# Patient Record
Sex: Female | Born: 1945
Health system: Southern US, Community
[De-identification: ages and names within clinical notes are randomized; demographics above are authoritative.]

## PROBLEM LIST (undated history)

## (undated) DIAGNOSIS — I2699 Other pulmonary embolism without acute cor pulmonale: Secondary | ICD-10-CM

## (undated) DIAGNOSIS — M1990 Unspecified osteoarthritis, unspecified site: Secondary | ICD-10-CM

## (undated) DIAGNOSIS — R05 Cough: Secondary | ICD-10-CM

## (undated) DIAGNOSIS — E538 Deficiency of other specified B group vitamins: Secondary | ICD-10-CM

## (undated) DIAGNOSIS — R053 Chronic cough: Secondary | ICD-10-CM

## (undated) DIAGNOSIS — I10 Essential (primary) hypertension: Secondary | ICD-10-CM

## (undated) DIAGNOSIS — R590 Localized enlarged lymph nodes: Secondary | ICD-10-CM

## (undated) DIAGNOSIS — I82409 Acute embolism and thrombosis of unspecified deep veins of unspecified lower extremity: Secondary | ICD-10-CM

## (undated) DIAGNOSIS — E785 Hyperlipidemia, unspecified: Secondary | ICD-10-CM

## (undated) DIAGNOSIS — K219 Gastro-esophageal reflux disease without esophagitis: Secondary | ICD-10-CM

## (undated) DIAGNOSIS — E119 Type 2 diabetes mellitus without complications: Secondary | ICD-10-CM

## (undated) DIAGNOSIS — F32A Depression, unspecified: Secondary | ICD-10-CM

## (undated) DIAGNOSIS — J189 Pneumonia, unspecified organism: Secondary | ICD-10-CM

## (undated) DIAGNOSIS — J45909 Unspecified asthma, uncomplicated: Secondary | ICD-10-CM

## (undated) DIAGNOSIS — F419 Anxiety disorder, unspecified: Secondary | ICD-10-CM

## (undated) HISTORY — DX: Hyperlipidemia, unspecified: E78.5

## (undated) HISTORY — DX: Chronic cough: R05.3

## (undated) HISTORY — PX: TONSILLECTOMY: SUR1361

## (undated) HISTORY — DX: Localized enlarged lymph nodes: R59.0

## (undated) HISTORY — DX: Unspecified asthma, uncomplicated: J45.909

## (undated) HISTORY — DX: Cough: R05

## (undated) HISTORY — PX: NASAL SINUS SURGERY: SHX719

## (undated) HISTORY — PX: KNEE ARTHROSCOPY: SUR90

## (undated) HISTORY — PX: CATARACT EXTRACTION: SUR2

## (undated) HISTORY — DX: Other pulmonary embolism without acute cor pulmonale: I26.99

## (undated) HISTORY — DX: Deficiency of other specified B group vitamins: E53.8

## (undated) HISTORY — DX: Pneumonia, unspecified organism: J18.9

## (undated) HISTORY — PX: TUBAL LIGATION: SHX77

---

## 1985-01-04 HISTORY — PX: TONSILLECTOMY: SUR1361

## 1993-01-04 HISTORY — PX: OTHER SURGICAL HISTORY: SHX169

## 1997-08-21 ENCOUNTER — Ambulatory Visit (HOSPITAL_COMMUNITY): Admission: RE | Admit: 1997-08-21 | Discharge: 1997-08-21 | Payer: Self-pay | Admitting: Orthopedic Surgery

## 1998-06-18 ENCOUNTER — Other Ambulatory Visit: Admission: RE | Admit: 1998-06-18 | Discharge: 1998-06-18 | Payer: Self-pay | Admitting: Family Medicine

## 1999-08-26 ENCOUNTER — Other Ambulatory Visit: Admission: RE | Admit: 1999-08-26 | Discharge: 1999-08-26 | Payer: Self-pay | Admitting: Family Medicine

## 2000-08-26 ENCOUNTER — Other Ambulatory Visit: Admission: RE | Admit: 2000-08-26 | Discharge: 2000-08-26 | Payer: Self-pay | Admitting: Family Medicine

## 2001-09-01 ENCOUNTER — Other Ambulatory Visit: Admission: RE | Admit: 2001-09-01 | Discharge: 2001-09-01 | Payer: Self-pay | Admitting: Family Medicine

## 2002-09-04 ENCOUNTER — Other Ambulatory Visit: Admission: RE | Admit: 2002-09-04 | Discharge: 2002-09-04 | Payer: Self-pay | Admitting: Family Medicine

## 2003-09-05 ENCOUNTER — Other Ambulatory Visit: Admission: RE | Admit: 2003-09-05 | Discharge: 2003-09-05 | Payer: Self-pay | Admitting: Family Medicine

## 2004-09-09 ENCOUNTER — Encounter: Admission: RE | Admit: 2004-09-09 | Discharge: 2004-09-09 | Payer: Self-pay | Admitting: Internal Medicine

## 2004-09-14 ENCOUNTER — Ambulatory Visit: Payer: Self-pay | Admitting: Family Medicine

## 2004-09-14 ENCOUNTER — Other Ambulatory Visit: Admission: RE | Admit: 2004-09-14 | Discharge: 2004-09-14 | Payer: Self-pay | Admitting: Family Medicine

## 2004-09-30 ENCOUNTER — Ambulatory Visit: Payer: Self-pay | Admitting: Family Medicine

## 2008-01-05 HISTORY — PX: OTHER SURGICAL HISTORY: SHX169

## 2008-08-15 ENCOUNTER — Encounter: Admission: RE | Admit: 2008-08-15 | Discharge: 2008-08-15 | Payer: Self-pay | Admitting: Orthopedic Surgery

## 2008-09-25 ENCOUNTER — Ambulatory Visit: Payer: Self-pay

## 2008-09-25 ENCOUNTER — Encounter: Payer: Self-pay | Admitting: Cardiovascular Disease

## 2008-11-27 ENCOUNTER — Inpatient Hospital Stay (HOSPITAL_COMMUNITY): Admission: RE | Admit: 2008-11-27 | Discharge: 2008-12-01 | Payer: Self-pay | Admitting: Orthopedic Surgery

## 2010-02-24 ENCOUNTER — Encounter: Payer: Self-pay | Admitting: Internal Medicine

## 2010-03-02 ENCOUNTER — Institutional Professional Consult (permissible substitution) (INDEPENDENT_AMBULATORY_CARE_PROVIDER_SITE_OTHER): Payer: Medicare Other | Admitting: Internal Medicine

## 2010-03-02 ENCOUNTER — Encounter: Payer: Self-pay | Admitting: Internal Medicine

## 2010-03-02 DIAGNOSIS — E785 Hyperlipidemia, unspecified: Secondary | ICD-10-CM | POA: Insufficient documentation

## 2010-03-02 DIAGNOSIS — R053 Chronic cough: Secondary | ICD-10-CM | POA: Insufficient documentation

## 2010-03-02 DIAGNOSIS — R599 Enlarged lymph nodes, unspecified: Secondary | ICD-10-CM | POA: Insufficient documentation

## 2010-03-02 DIAGNOSIS — R05 Cough: Secondary | ICD-10-CM | POA: Insufficient documentation

## 2010-03-02 DIAGNOSIS — J45909 Unspecified asthma, uncomplicated: Secondary | ICD-10-CM | POA: Insufficient documentation

## 2010-03-02 DIAGNOSIS — J189 Pneumonia, unspecified organism: Secondary | ICD-10-CM | POA: Insufficient documentation

## 2010-03-02 DIAGNOSIS — I2699 Other pulmonary embolism without acute cor pulmonale: Secondary | ICD-10-CM | POA: Insufficient documentation

## 2010-03-03 ENCOUNTER — Encounter: Payer: Self-pay | Admitting: Internal Medicine

## 2010-03-05 ENCOUNTER — Institutional Professional Consult (permissible substitution): Payer: Self-pay | Admitting: Critical Care Medicine

## 2010-03-12 NOTE — Assessment & Plan Note (Signed)
Summary: Pulmonary/ new pt eval for cough/ hilar adenopathy    Visit Type:  Initial Consult Copy to:  Kathryn Sofia, PA Primary Provider/Referring Provider:  [name], PA  CC:  Cough and Abnormal CT Chest.  History of Present Illness: 56 yowf never smoker with year round nose draining and cough x decades on zyrtec daily as maintenance with previous allergy eval inconclusive by Dr Lucie Leather and rx with inhalers seem to help coughing spells but no daily use.    March 02, 2010  1st pulmonary office eval cc acute worse x 3 weeks with bad cough assoc with nasal congestion,  productive green mucus with pain on both flanks,  low grade,  weak couldn't sit up, no appetite rx as outpt  abx x 2  and maybe prednisone shot  >  still coughing on day of ov but no fever or green mucus and overall much better.    Presently   denies any significant sore throat, dysphagia, itching, sneezing,    fever, chills, sweats, unintended wt loss, pleuritic or exertional cp, hempoptysis, change in activity tolerance  orthopnea pnd or leg swelling Pt also denies any obvious fluctuation in symptoms with weather or environmental change or other alleviating or aggravating factors.       Current Medications (verified): 1)  Losartan Potassium 50 Mg Tabs (Losartan Potassium) .Marland Kitchen.. 1 Once Daily 2)  Furosemide 20 Mg Tabs (Furosemide) .Marland Kitchen.. 1 Once Daily 3)  Citalopram Hydrobromide 40 Mg Tabs (Citalopram Hydrobromide) .Marland Kitchen.. 1 Once Daily 4)  Trilipix 135 Mg Cpdr (Choline Fenofibrate) .Marland Kitchen.. 1 Once Daily 5)  Vitamin D (Ergocalciferol) 50000 Unit Caps (Ergocalciferol) .Marland Kitchen.. 1 Every Week 6)  Ranitidine Hcl 300 Mg Caps (Ranitidine Hcl) .Marland Kitchen.. 1 At Bedtime 7)  Hydromet 5-1.5 Mg/20ml Syrp (Hydrocodone-Homatropine) .Marland Kitchen.. 1 Tsp Four Times A Day As Needed 8)  Proventil Hfa 108 (90 Base) Mcg/act Aers (Albuterol Sulfate) .... 2 Puffs Every 4-6 Hrs As Needed 9)  Cefdinir 300 Mg Caps (Cefdinir) .Marland Kitchen.. 1 Every 12 Hrs X 10 Days Per Dr Sed[place] Muta 10)  Fish Oil  1000 Mg Caps (Omega-3 Fatty Acids) .Marland Kitchen.. 1 Two Times A Day 11)  Aspirin 81 Mg Tbec (Aspirin) .Marland Kitchen.. 1 Once Daily 12)  Zyrtec Allergy 10 Mg Tabs (Cetirizine Hcl) .Marland Kitchen.. 1 Once Daily As Needed  Allergies (verified): 1)  Prednisone  Past History:  Past Medical History: HYPERLIPIDEMIA (ICD-272.4) ASTHMA (ICD-493.90) Hx of PNEUMONIA (ICD-486) Hx of PULMONARY EMBOLISM (ICD-415.19) Hilar adenopathy by CT 02/24/2010       Past Surgical History: Left shoulder surgery 1995 Bilateral arthroscopic knee surgeries Left knee replacement 2010 Sinus surgery age 86 Tubal ligation age 32 Tonsillectomy age 64  Family History: Colon CA-Cousin Breast CA- Cousin RA- Mother  Social History: Married Children Never smoker No ETOH Retired from Facilities manager  Review of Systems       The patient complains of shortness of breath with activity, productive cough, acid heartburn, nasal congestion/difficulty breathing through nose, sneezing, and change in color of mucus.  The patient denies shortness of breath at rest, non-productive cough, coughing up blood, chest pain, irregular heartbeats, indigestion, loss of appetite, weight change, abdominal pain, difficulty swallowing, sore throat, tooth/dental problems, headaches, itching, ear ache, anxiety, depression, hand/feet swelling, joint stiffness or pain, rash, and fever.    Vital Signs:  Patient profile:   65 year old female Height:      66 inches Weight:      230.50 pounds BMI:     37.34 O2 Sat:  95 % on Room air Temp:     97.8 degrees F oral Pulse rate:   77 / minute BP sitting:   136 / 74  (left arm) Cuff size:   large  Vitals Entered By: [name] (March 02, 2010 8:48 AM)  O2 Flow:  Room air  Physical Exam  Additional Exam:  obese amb wf nad wt 230 March 02, 2010  HEENT: nl dentition and orophanx.Mod turbinate edema, nonspecific.  Nl external ear canals without cough reflex NECK :  without JVD/Nodes/TM/ nl carotid  upstrokes bilaterally LUNGS: no acc muscle use, clear to A and P bilaterally without cough on insp or exp maneuvers CV:  RRR  no s3 or murmur or increase in P2, no edema  ABD:  soft and nontender with nl excursion in the supine position. No bruits or organomegaly, bowel sounds nl MS:  warm without deformities, calf tenderness, cyanosis or clubbing SKIN: warm and dry without lesions   NEURO:  alert, approp, no deficits     Impression & Recommendations:  Problem # 1:  COUGH (ICD-786.2)  The most common causes of chronic cough in immunocompetent adults include: upper airway cough syndrome (UACS), previously referred to as postnasal drip syndrome,  caused by variety of rhinosinus conditions; (2) asthma; (3) GERD; (4) chronic bronchitis from cigarette smoking or other inhaled environmental irritants; (5) nonasthmatic eosinophilic bronchitis; and (6) bronchiectasis. These conditions, singly or in combination, have accounted for up to 94% of the causes of chronic cough in prospective studies.    Classic Upper airway cough syndrome, so named because it's frequently impossible to sort out how much is  CR/sinusitis with freq throat clearing (which can be related to primary GERD)   vs  causing  secondary extra esophageal GERD from wide swings in gastric pressure that occur with throat clearing, promoting self use of mint and menthol lozenges that reduce the lower esophageal sphincter tone and exacerbate the problem further These are the same pts who not infrequently have failed to tolerate ace inhibitors,  dry powder inhalers or biphosphonates or report having reflux symptoms that don't respond to standard doses of PPI   See instructions for specific recommendations.  start with rx of reflux then add sinus ct if not better as next step.  Orders: New Patient Level V (81191)  Problem # 2:  ADENOPATHY LYMPH GLAND/TRACHEOBR (ICD-785.6) Most likely reactive but definitely needs f/u - the biggest node is only  2.3 cm and assuming her symptoms resolve i would not pursue a tissue dx unless there is convincing evidence of "macroscopic dz" that can be discerned by plain cxr.  otherwise we end up pursuing what most of the time is either  reactive adenopathy or sometimes sarcoid for which treatment is not indicated.   If there is a malignancy or lymphoproliferative process she'll either have persistent  symptoms or a cxr that shows convincing abnormalities which would justify an invasive procedure.   Explained all this to pt in detail:  treat the symptoms first,  f/u conservatively for now     Medications Added to Medication List This Visit: 1)  Losartan Potassium 50 Mg Tabs (Losartan potassium) .Marland Kitchen.. 1 once daily 2)  Furosemide 20 Mg Tabs (Furosemide) .Marland Kitchen.. 1 once daily 3)  Citalopram Hydrobromide 40 Mg Tabs (Citalopram hydrobromide) .Marland Kitchen.. 1 once daily 4)  Trilipix 135 Mg Cpdr (Choline fenofibrate) .Marland Kitchen.. 1 once daily 5)  Vitamin D (ergocalciferol) 50000 Unit Caps (Ergocalciferol) .Marland Kitchen.. 1 every week 6)  Ranitidine Hcl 300  Mg Caps (Ranitidine hcl) .Marland Kitchen.. 1 at bedtime 7)  Hydromet 5-1.5 Mg/55ml Syrp (Hydrocodone-homatropine) .Marland Kitchen.. 1 tsp four times a day as needed 8)  Cefdinir 300 Mg Caps (Cefdinir) .Marland Kitchen.. 1 every 12 hrs x 10 days per dr cox 9)  Fish Oil 1000 Mg Caps (Omega-3 fatty acids) .Marland Kitchen.. 1 two times a day 10)  Aspirin 81 Mg Tbec (Aspirin) .Marland Kitchen.. 1 once daily 11)  Zyrtec Allergy 10 Mg Tabs (Cetirizine hcl) .Marland Kitchen.. 1 once daily as needed 12)  Proventil Hfa 108 (90 Base) Mcg/act Aers (Albuterol sulfate) .... 2 puffs every 4-6 hrs as needed 13)  Protonix 40 Mg Tbec (Pantoprazole sodium) .... By mouth daily. take one half hour before eating. 14)  Tramadol Hcl 50 Mg Tabs (Tramadol hcl) .... One to two by mouth every 4-6 hours as needed cough or chest pain  Patient Instructions: 1)  Start protonix 40 mg Take  one 30-60 min before first meal of the day (prilosec otc 20 x 2)  2)  Continue ranitidine 150 mg one at bedtime 3)   Take mucinex 2 every 12 hours and add tramadol 50 mg up to every 4 hours to suppress the urge to cough. Swallowing water or using ice chips/non mint and menthol containing candies (such as lifesavers or sugarless jolly ranchers) are also effective.  4)  GERD (REFLUX)  is a common cause of respiratory symptoms. It commonly presents without heartburn and can be treated with medication, but also with lifestyle changes including avoidance of late meals, excessive alcohol, smoking cessation, and avoid fatty foods, chocolate, peppermint, colas, red wine, and acidic juices such as orange juice. NO MINT OR MENTHOL PRODUCTS SO NO COUGH DROPS  5)  USE SUGARLESS CANDY INSTEAD (jolley ranchers)  6)  NO OIL BASED VITAMINS (NO FISH OIL).  7)  Please schedule a follow-up appointment in 2 weeks, sooner if needed with cxr on return and if at all possible bring your old cxr 8)  If not better [phone] ask [facility] for CT Sinus limited on day of visit Prescriptions: PROTONIX 40 MG  TBEC (PANTOPRAZOLE SODIUM) By mouth daily. Take one half hour before eating.  #34 x 2   Entered and Authorized by:   Nyoka Cowden MD   Signed by:   Nyoka Cowden MD on 03/02/2010   Method used:   Electronically to        [facility].* (retail)       [address]       [place], [place]  [address]       Ph: [phone] or [phone]       Fax: [phone]   RxID:   [id number] TRAMADOL HCL 50 MG  TABS (TRAMADOL HCL) One to two by mouth every 4-6 hours as needed cough or chest pain  #40 x 0   Entered and Authorized by:   Nyoka Cowden MD   Signed by:   Nyoka Cowden MD on 03/02/2010   Method used:   Electronically to        [facility] (retail)       [address]       [place], [place]  [address]       Ph: [phone] or [phone]       Fax: [phone]   RxID:   [id number]

## 2010-03-16 ENCOUNTER — Other Ambulatory Visit: Payer: Self-pay | Admitting: Internal Medicine

## 2010-03-16 ENCOUNTER — Telehealth: Payer: Self-pay | Admitting: Internal Medicine

## 2010-03-16 DIAGNOSIS — J329 Chronic sinusitis, unspecified: Secondary | ICD-10-CM

## 2010-03-19 ENCOUNTER — Other Ambulatory Visit: Payer: Self-pay | Admitting: Internal Medicine

## 2010-03-19 ENCOUNTER — Ambulatory Visit (INDEPENDENT_AMBULATORY_CARE_PROVIDER_SITE_OTHER): Payer: Medicare Other | Admitting: Internal Medicine

## 2010-03-19 ENCOUNTER — Encounter: Payer: Self-pay | Admitting: Internal Medicine

## 2010-03-19 ENCOUNTER — Institutional Professional Consult (permissible substitution): Payer: Self-pay | Admitting: Internal Medicine

## 2010-03-19 ENCOUNTER — Other Ambulatory Visit: Payer: Medicare Other

## 2010-03-19 ENCOUNTER — Ambulatory Visit (INDEPENDENT_AMBULATORY_CARE_PROVIDER_SITE_OTHER)
Admission: RE | Admit: 2010-03-19 | Discharge: 2010-03-19 | Disposition: A | Discharge status: Home / Self Care | Payer: Medicare Other | Source: Ambulatory Visit | Attending: Internal Medicine | Admitting: Internal Medicine

## 2010-03-19 DIAGNOSIS — R599 Enlarged lymph nodes, unspecified: Secondary | ICD-10-CM

## 2010-03-19 DIAGNOSIS — M949 Disorder of cartilage, unspecified: Secondary | ICD-10-CM

## 2010-03-19 DIAGNOSIS — M899 Disorder of bone, unspecified: Secondary | ICD-10-CM | POA: Insufficient documentation

## 2010-03-19 DIAGNOSIS — R0609 Other forms of dyspnea: Secondary | ICD-10-CM | POA: Insufficient documentation

## 2010-03-19 DIAGNOSIS — R05 Cough: Secondary | ICD-10-CM

## 2010-03-19 DIAGNOSIS — R0989 Other specified symptoms and signs involving the circulatory and respiratory systems: Secondary | ICD-10-CM

## 2010-03-19 DIAGNOSIS — J329 Chronic sinusitis, unspecified: Secondary | ICD-10-CM

## 2010-03-19 LAB — HEPATIC FUNCTION PANEL
ALT: 36 U/L — ABNORMAL HIGH (ref 0–35)
Albumin: 4 g/dL (ref 3.5–5.2)
Alkaline Phosphatase: 49 U/L (ref 39–117)
Bilirubin, Direct: 0.1 mg/dL (ref 0.0–0.3)
Total Bilirubin: 0.5 mg/dL (ref 0.3–1.2)

## 2010-03-20 LAB — SEDIMENTATION RATE: Sed Rate: 22 mm/hr (ref 0–22)

## 2010-03-20 LAB — CONVERTED CEMR LAB: IgE (Immunoglobulin E), Serum: 37.5 intl units/mL (ref 0.0–180.0)

## 2010-03-24 NOTE — Progress Notes (Signed)
Summary: CT  Phone Note Call from Patient Call back at Home Phone [phone]   Caller: Patient Call For: Sevana Grandinetti Summary of Call: PT SAYS SHE IS TO HAVE A SINUS CT SCHEDULED Initial call taken by: Tivis Ringer, CNA,  March 16, 2010 9:33 AM  Follow-up for Phone Call        Spoke with pt and she staets seh has not seen any real improvement in ther sinus symptoms. She staes she is still very congested and when she blows her nose she gets alot of blood. Pt staets MW told her if no improvement then he would order ct of sinus. According to last OV note MW stated for pt to call and if no improvement htne ok to order CT, so order placed pt aware [facility] will contact with date. Carron Curie CMA  March 16, 2010 10:01 AM

## 2010-03-24 NOTE — Assessment & Plan Note (Signed)
Summary: Pulmonary/   comprehensive eval for sob/cough/abn CT    Copy to:  [name], PA Primary Provider/Referring Provider:  [name], PA  CC:  Cough- no change.  History of Present Illness: 39 yowf never smoker with year round nose draining and cough x decades on zyrtec daily as maintenance with previous allergy eval inconclusive (mold and dust  by Dr Lucie Leather and rx with inhalers seem to help coughing spells but no daily use.    March 02, 2010  1st pulmonary office eval cc acute worse x 3 weeks with bad cough assoc with nasal congestion,  productive green mucus with pain on both flanks,  low grade,  weak couldn't sit up, no appetite rx as outpt  abx x 2  and maybe prednisone shot  >  still coughing on day of ov but no fever or green mucus and overall much better. Start protonix 40 mg Take  one 30-60 min before first meal of the day (prilosec otc 20 x 2)  Continue ranitidine 150 mg one at bedtime Take mucinex 2 every 12 hours and add tramadol 50 mg up to every 4 hours  Please schedule a follow-up appointment in 2 weeks, sooner if needed with cxr on return and if at all possible bring your old cxr    March 19, 2010 ov cc dry cough/ throat congestion got some better and now losing ground despite ? adhering to rx,  tried proaire no help whereas previously helped.  not compliant with previous recs. still using oil based vitamins     Pt denies any significant sore throat, dysphagia, itching, sneezing,  nasal congestion or excess secretions,  fever, chills, sweats, unintended wt loss, pleuritic or exertional cp, hempoptysis, change in activity tolerance  orthopnea pnd or leg swelling Pt also denies any obvious fluctuation in symptoms with weather or environmental change or other alleviating or aggravating factors.            Current Medications (verified): 1)  Losartan Potassium 50 Mg Tabs (Losartan Potassium) .Marland Kitchen.. 1 Once Daily 2)  Furosemide 20 Mg Tabs (Furosemide) .Marland Kitchen.. 1 Once  Daily 3)  Citalopram Hydrobromide 40 Mg Tabs (Citalopram Hydrobromide) .Marland Kitchen.. 1 Once Daily 4)  Trilipix 135 Mg Cpdr (Choline Fenofibrate) .Marland Kitchen.. 1 Once Daily 5)  Vitamin D (Ergocalciferol) 50000 Unit Caps (Ergocalciferol) .Marland Kitchen.. 1 Every Week 6)  Ranitidine Hcl 300 Mg Caps (Ranitidine Hcl) .Marland Kitchen.. 1 At Bedtime 7)  Hydromet 5-1.5 Mg/31ml Syrp (Hydrocodone-Homatropine) .Marland Kitchen.. 1 Tsp Four Times A Day As Needed 8)  Aspirin 81 Mg Tbec (Aspirin) .Marland Kitchen.. 1 Once Daily 9)  Zyrtec Allergy 10 Mg Tabs (Cetirizine Hcl) .Marland Kitchen.. 1 Once Daily As Needed 10)  Proventil Hfa 108 (90 Base) Mcg/act Aers (Albuterol Sulfate) .... 2 Puffs Every 4-6 Hrs As Needed 11)  Protonix 40 Mg  Tbec (Pantoprazole Sodium) .... By Mouth Daily. Take One Half Hour Before Eating. 12)  Tramadol Hcl 50 Mg  Tabs (Tramadol Hcl) .... One To Two By Mouth Every 4-6 Hours As Needed Cough or Chest Pain  Allergies (verified): 1)  Prednisone  Past History:  Past Medical History: HYPERLIPIDEMIA (ICD-272.4) ASTHMA (ICD-493.90) Hx of PNEUMONIA (ICD-486) Hx of PULMONARY EMBOLISM (ICD-415.19) Hilar adenopathy by CT 02/24/2010 Chronic cough     - Sinus CT neg March 19, 2010      - Allergy eval March 19, 2010 >>> IgE 38 pos only for grass     - Singulair trial March 19, 2010        Family  History: Reviewed history from 03/02/2010 and no changes required. Colon CA-Cousin Breast CA- Cousin RA- Mother  Social History: Reviewed history from 03/02/2010 and no changes required. Married Children Never smoker No ETOH Retired from Facilities manager  Vital Signs:  Patient profile:   65 year old female Weight:      229.38 pounds O2 Sat:      97 % on Room air Temp:     97.8 degrees F oral Pulse rate:   95 / minute BP sitting:   132 / 74  (left arm) Cuff size:   large  Vitals Entered ByVernie Murders (March 19, 2010 3:59 PM)  O2 Flow:  Room air  Physical Exam  Additional Exam:  obese amb wf nad wt 230 March 02, 2010  > 229 March 19, 2010   HEENT: nl dentition and orophanx.Mod turbinate edema, nonspecific.  Nl external ear canals without cough reflex NECK :  without JVD/Nodes/TM/ nl carotid upstrokes bilaterally LUNGS: no acc muscle use, clear to A and P bilaterally without cough on insp or exp maneuvers CV:  RRR  no s3 or murmur or increase in P2, no edema  ABD:  soft and nontender with nl excursion in the supine position. No bruits or organomegaly, bowel sounds nl MS:  warm without deformities, calf tenderness, cyanosis or clubbing     Total Bilirubin           0.5 mg/dL                   9.8-1.1   Direct Bilirubin          0.1 mg/dL                   9.1-4.7   Alkaline Phosphatase      49 U/L                      39-117   AST                       33 U/L                      0-37   ALT                  [H]  36 U/L                      0-35   Total Protein             7.0 g/dL                    8.2-9.5   Albumin                   4.0 g/dL                    6.2-1.3  Tests: (2) B-Type Natiuretic Peptide (BNPR)  B-Type Natriuetic Peptide                             59.8 pg/mL                  0.0-100.0  Tests: (3) Sed Rate (ESR)   Sed Rate                  22 mm/hr  0-22  CXR  Procedure date:  03/19/2010  Findings:      Comparison: 11/30/2008  Findings: Normal heart size.  Negative for heart failure.  Negative for pneumonia or effusion.  Lungs are clear.  IMPRESSION: No active cardiopulmonary disease.  Improved aeration from the prior study.  Impression & Recommendations:  Problem # 1:  COUGH (ICD-786.2) The most common causes of chronic cough in immunocompetent adults include: upper airway cough syndrome (UACS), previously referred to as postnasal drip syndrome,  caused by variety of rhinosinus conditions; (2) asthma; (3) GERD; (4) chronic bronchitis from cigarette smoking or other inhaled environmental irritants; (5) nonasthmatic eosinophilic bronchitis; and (6) bronchiectasis. These  conditions, singly or in combination, have accounted for up to 94% of the causes of chronic cough in prospective studies.    Classic Upper airway cough syndrome, so named because it's frequently impossible to sort out how much is  CR/sinusitis with freq throat clearing (which can be related to primary GERD)   vs  causing  secondary extra esophageal GERD from wide swings in gastric pressure that occur with throat clearing, promoting self use of mint and menthol lozenges that reduce the lower esophageal sphincter tone and exacerbate the problem further These are the same pts who not infrequently have failed to tolerate ace inhibitors,  dry powder inhalers or biphosphonates or report having reflux symptoms that don't respond to standard doses of PPI   .  continue  rx of reflux then check allergy profile  as next step and add H1 as needed pnds per guidlines since sinus ct neg  Singulair trial since may have components of asthma and rhinitis and hasn't tried it yet.   She is struggling mightily with the concept of accurate medication reconciliation.  To keep things simple, I have asked the patient to first separate medicines that are perceived as maintenance, that is to be taken daily "no matter what", from those medicines that are taken on only on an as-needed basis and I have given the patient examples of both, and then return to see our NP to generate a  detailed  medication calendar which should be followed until the next physician sees the patient and updates it.   Once we're sure that we're all reading from the same page in terms of medication admiistration,  fu with me.  Problem # 2:  ADENOPATHY LYMPH GLAND/TRACHEOBR (ICD-785.6) ACE is elevated but this is not all likely to be sarcoid and there is nothing at all obvious on cxr.  ESR is not even elevated....using the philosophy that the punishment needs to fit the crime I am very very reluctant to rec invasive w/u here  Discussed in detail all the   indications, usual  risks and alternatives  relative to the benefits with patient who agrees to proceed with conservative f/u as long as we can address her symptoms to her satisfaction, which remains to be seen.  Medications Added to Medication List This Visit: 1)  Singulair 10 Mg Tabs (Montelukast sodium) .... One by mouth daily 2)  Chlor-trimeton 4 Mg Tabs (Chlorpheniramine maleate) .... One at bedtime automatically then one every 6 hours as needed for drainage  Other Orders: T-Allergy Profile [place], [place], [place], [place], [place] [phone]) T-2 View CXR [phone]) Prescription Created Electronically [phone]) Est. Patient Level V [phone]) T-Vitamin D (25-Hydroxy) (51761-60737) TLB-Hepatic/Liver Function Pnl (80076-HEPATIC) TLB-BNP (B-Natriuretic Peptide) (83880-BNPR) TLB-Sedimentation Rate (ESR) (85652-ESR)  Patient Instructions: 1)  Start protonix 40 mg Take  one 30-60 min before first  meal of the day (prilosec otc 20 x 2)   2)  Continue ranitidine 150 mg one at bedtime 3)  Singulair 10 mg every evening 4)  stop all oil based vitamins (NO VIT D for Now) 5)  For cough, Take  delsym 2 tsp every 12 hours and add tramadol 50 mg up to every 4 hours to suppress the urge to cough. Swallowing water or using ice chips/non mint and menthol containing candies (such as lifesavers or sugarless jolly ranchers) are also effective.  6)  for short of breath or wheezing take proaire  7)  for draingage chlortrimeton 4 mg at bedtime and as needed during the day 8)  See Tammy NP w/in 2 weeks with all your medications, even over the counter meds, separated in two separate bags, the ones you take no matter what vs the ones you stop once you feel better and take only as needed.  She will generate for you a new user friendly medication calendar that will put [place] all on the same page re: your medication use.  Prescriptions: SINGULAIR 10 MG  TABS (MONTELUKAST SODIUM) One by mouth daily  #30 x 11   Entered and Authorized by:    Nyoka Cowden MD   Signed by:   Nyoka Cowden MD on 03/19/2010   Method used:   Electronically to        [facility] (retail)       [address]       [place], [place]  [address]       Ph: [phone] or [phone]       Fax: [phone]   RxID:   [id number]

## 2010-04-06 ENCOUNTER — Ambulatory Visit (INDEPENDENT_AMBULATORY_CARE_PROVIDER_SITE_OTHER): Payer: Medicare Other | Admitting: Adult Health

## 2010-04-06 ENCOUNTER — Encounter: Payer: Self-pay | Admitting: Adult Health

## 2010-04-06 VITALS — BP 128/74 | HR 90 | Temp 98.0°F | Ht 65.0 in | Wt 228.2 lb

## 2010-04-06 DIAGNOSIS — R05 Cough: Secondary | ICD-10-CM

## 2010-04-06 NOTE — Assessment & Plan Note (Addendum)
Cyclical cough with underlying chronic rhinitis with recent flare w/ ?URI /sinusits tx by PCP with abx.  She is now improving.  Advised on cough suppression, rhinitis/GERD prevention Meds reviewed and organzied into a med calendar.  Plan:  Will continue on current regimen  follow up in 4 weeks with Dr. Sherene Sires

## 2010-04-06 NOTE — Progress Notes (Signed)
  Subjective:    Patient ID: [name], female    DOB: [birth year], 65 y.o.   MRN: [id number]  HPI 42 yowf never smoker with year round nose draining and cough x decades on zyrtec daily as maintenance with previous allergy eval inconclusive (mold and dust by Dr Lucie Leather and rx with inhalers seem to help coughing spells but no daily use.   March 02, 2010 1st pulmonary office eval cc acute worse x 3 weeks with bad cough assoc with nasal congestion, productive green mucus with pain on both flanks, low grade, weak couldn't sit up, no appetite rx as outpt abx x 2 and maybe prednisone shot > still coughing on day of ov but no fever or green mucus and overall much better. Start protonix 40 mg Take one 30-60 min before first meal of the day (prilosec otc 20 x 2)  Continue ranitidine 150 mg one at bedtime  Take mucinex 2 every 12 hours and add tramadol 50 mg up to every 4 hours  Please schedule a follow-up appointment in 2 weeks, sooner if needed with cxr on return and if at all possible bring your old cxr   March 19, 2010 ov cc dry cough/ throat congestion got some better and now losing ground despite ? adhering to rx, tried proaire no help whereas previously helped. not compliant with previous recs. still using oil based vitamins >>Rx Singulair , RAST and CT sinus , rec chlor tabs    04/06/2010 2 week follow up and Med review. Last visit with persistent symptoms of cough and congestion. She underwent Sinus Ct which was neg for acute infection. Allergy profile was positive for grass, IGE was 38. She says she feels better now but has flare few days after ov here . Was seen by PCP felt to have sinus/bronchitis. Give Augmentin. She has now finished and cough/congestion is better But still has drippy nose/drainage. Feels it is due to high pollen counts.   Cough is mainly dry , has stuffy nose and some post nasal drainage.  Worse at night.  We reviewed her meds and organized them into med calendar She has not  found the chlor tabs yet-as prescribed from last ov.   Review of Systems   Constitutional:   No  weight loss, night sweats,  Fevers, chills, fatigue, lassitude. HEENT:   No headaches,  Difficulty swallowing,  Tooth/dental problems,  Sore throat,                No sneezing, itching, ear ache    CV:  No chest pain,  Orthopnea, PND, swelling in lower extremities, anasarca, dizziness, palpitations  GI  No heartburn, indigestion, abdominal pain, nausea, vomiting, diarrhea, change in bowel habits, loss of appetite  Resp:  ,  No coughing up of blood.  No change in color of mucus.  No wheezing.  No chest wall deformity  Skin: no rash or lesions.  GU: no dysuria, change in color of urine, no urgency or frequency.  No flank pain.  MS:  No joint pain or swelling.  No decreased range of motion.  No back pain.  Psych:  No change in mood or affect. No depression or anxiety.  No memory loss.                        Objective:   Physical Exam        Assessment & Plan:

## 2010-04-06 NOTE — Patient Instructions (Signed)
Continue on current regimen.  Follow med calendar closely and bring to each visit.  follow up Dr. Sherene Sires  In 4 weeks and As needed

## 2010-04-08 ENCOUNTER — Other Ambulatory Visit: Payer: Self-pay | Admitting: *Deleted

## 2010-04-08 LAB — URINALYSIS, ROUTINE W REFLEX MICROSCOPIC
Glucose, UA: NEGATIVE mg/dL
Urobilinogen, UA: 1 mg/dL (ref 0.0–1.0)
pH: 5.5 (ref 5.0–8.0)

## 2010-04-08 LAB — COMPREHENSIVE METABOLIC PANEL
ALT: 68 U/L — ABNORMAL HIGH (ref 0–35)
Calcium: 9.2 mg/dL (ref 8.4–10.5)
Chloride: 104 mEq/L (ref 96–112)
Potassium: 3.9 mEq/L (ref 3.5–5.1)
Total Protein: 6.4 g/dL (ref 6.0–8.3)

## 2010-04-08 LAB — CBC
HCT: 31 % — ABNORMAL LOW (ref 36.0–46.0)
HCT: 31.4 % — ABNORMAL LOW (ref 36.0–46.0)
HCT: 37.6 % (ref 36.0–46.0)
Hemoglobin: 11 g/dL — ABNORMAL LOW (ref 12.0–15.0)
Hemoglobin: 12.7 g/dL (ref 12.0–15.0)
MCHC: 33.7 g/dL (ref 30.0–36.0)
MCV: 87.1 fL (ref 78.0–100.0)
MCV: 88.5 fL (ref 78.0–100.0)
MCV: 88.6 fL (ref 78.0–100.0)
MCV: 88.7 fL (ref 78.0–100.0)
Platelets: 227 10*3/uL (ref 150–400)
RBC: 3.54 MIL/uL — ABNORMAL LOW (ref 3.87–5.11)
RDW: 15 % (ref 11.5–15.5)
RDW: 15.4 % (ref 11.5–15.5)
RDW: 16.1 % — ABNORMAL HIGH (ref 11.5–15.5)
WBC: 6.5 10*3/uL (ref 4.0–10.5)
WBC: 9.4 10*3/uL (ref 4.0–10.5)

## 2010-04-08 LAB — BASIC METABOLIC PANEL
BUN: 16 mg/dL (ref 6–23)
BUN: 18 mg/dL (ref 6–23)
CO2: 29 mEq/L (ref 19–32)
Calcium: 7.6 mg/dL — ABNORMAL LOW (ref 8.4–10.5)
Chloride: 104 mEq/L (ref 96–112)
Chloride: 99 mEq/L (ref 96–112)
GFR calc Af Amer: >60 mL/min (ref >60)
GFR calc Af Amer: >60 mL/min (ref >60)
GFR calc non Af Amer: >60 mL/min (ref >60)
Glucose, Bld: 114 mg/dL — ABNORMAL HIGH (ref 70–99)
Glucose, Bld: 122 mg/dL — ABNORMAL HIGH (ref 70–99)
Glucose, Bld: 167 mg/dL — ABNORMAL HIGH (ref 70–99)
Potassium: 3.9 mEq/L (ref 3.5–5.1)
Potassium: 4.1 mEq/L (ref 3.5–5.1)
Potassium: 4.8 mEq/L (ref 3.5–5.1)
Sodium: 134 mEq/L — ABNORMAL LOW (ref 135–145)
Sodium: 137 mEq/L (ref 135–145)

## 2010-04-08 LAB — GLUCOSE, CAPILLARY
Glucose-Capillary: 121 mg/dL — ABNORMAL HIGH (ref 70–99)
Glucose-Capillary: 121 mg/dL — ABNORMAL HIGH (ref 70–99)
Glucose-Capillary: 130 mg/dL — ABNORMAL HIGH (ref 70–99)
Glucose-Capillary: 136 mg/dL — ABNORMAL HIGH (ref 70–99)
Glucose-Capillary: 136 mg/dL — ABNORMAL HIGH (ref 70–99)
Glucose-Capillary: 138 mg/dL — ABNORMAL HIGH (ref 70–99)
Glucose-Capillary: 166 mg/dL — ABNORMAL HIGH (ref 70–99)

## 2010-04-08 LAB — URINE MICROSCOPIC-ADD ON

## 2010-04-08 LAB — TYPE AND SCREEN: Antibody Screen: NEGATIVE

## 2010-04-08 LAB — PROTIME-INR: INR: 1.34 (ref 0.00–1.49)

## 2010-04-08 MED ORDER — HYDROCODONE-HOMATROPINE 5-1.5 MG/5ML PO SYRP: ORAL_SOLUTION | ORAL | Status: DC

## 2010-05-18 ENCOUNTER — Ambulatory Visit (INDEPENDENT_AMBULATORY_CARE_PROVIDER_SITE_OTHER)
Admission: RE | Admit: 2010-05-18 | Discharge: 2010-05-18 | Disposition: A | Discharge status: Home / Self Care | Payer: Medicare Other | Source: Ambulatory Visit | Attending: Internal Medicine | Admitting: Internal Medicine

## 2010-05-18 ENCOUNTER — Ambulatory Visit (INDEPENDENT_AMBULATORY_CARE_PROVIDER_SITE_OTHER): Payer: Medicare Other | Admitting: Internal Medicine

## 2010-05-18 ENCOUNTER — Encounter: Payer: Self-pay | Admitting: Internal Medicine

## 2010-05-18 VITALS — BP 138/80 | HR 97 | Temp 98.1°F | Ht 66.0 in | Wt 231.6 lb

## 2010-05-18 DIAGNOSIS — R599 Enlarged lymph nodes, unspecified: Secondary | ICD-10-CM

## 2010-05-18 DIAGNOSIS — R05 Cough: Secondary | ICD-10-CM

## 2010-05-18 NOTE — Assessment & Plan Note (Addendum)
Almost certainly this is  Classic Upper airway cough syndrome, so named because it's frequently impossible to sort out how much is  CR/sinusitis with freq throat clearing (which can be related to primary GERD)   vs  causing  secondary (" extra esophageal")  GERD from wide swings in gastric pressure that occur with throat clearing, often  promoting self use of mint and menthol lozenges that reduce the lower esophageal sphincter tone and exacerbate the problem further in a cyclical fashion.   These are the same pts who not infrequently have failed to tolerate ace inhibitors,  dry powder inhalers or biphosphonates or report having reflux symptoms that don't respond to standard doses of PPI , and are easily confused as having aecopd or asthma flares,   .Chronic cough is often simultaneously caused by more than one condition. A single cause has been found from 38 to 82% of the time, multiple causes from 18 to 62%. Multiply caused cough has been the result of three diseases up to 42% of the time.    I had an extended discussion with the patient today lasting 15 to 20 minutes of a 25 minute visit on the following issues:  Unlike when you get a prescription for eyeglasses, it's not possible to always walk out of this or any medical office with a perfect prescription that is immediately effective  based on any test that we offer here.    On the contrary, it may take several weeks for the full impact of changes recommened today - hopefully you will respond well.  If not, then we'll adjust your medication on your next visit accordingly, knowing more then than we can possibly know now.    See instructions for specific recommendations which were reviewed directly with the patient who was given a copy with highlighter outlining the key components.    See instructions for specific recommendations which were reviewed directly with the patient who was given a copy with highlighter outlining the key components.

## 2010-05-18 NOTE — Assessment & Plan Note (Signed)
Based on cxr and response to rx for cough (though not 100% since present for decades doubt it will ever be 100% controlled and not likely related to adenopathy) with a nl esr and a nl cxr would not pursue CT dx of adenopathy based on extremely low likelihood of changing outcome with a tissue dx  Discussed in detail all the  indications, usual  risks and alternatives  relative to the benefits with patient who agrees to proceed with conservative f/u as outlined

## 2010-05-18 NOTE — Progress Notes (Signed)
Quick Note:  Spoke with pt and notified of results per Dr. Wert. Pt verbalized understanding and denied any questions.  ______ 

## 2010-05-18 NOTE — Patient Instructions (Addendum)
GERD (REFLUX)  is an extremely common cause of respiratory symptoms, many times with no significant heartburn at all.    It can be treated with medication, but also with lifestyle changes including avoidance of late meals, excessive alcohol, smoking cessation, and avoid fatty foods, chocolate, peppermint, colas, red wine, and acidic juices such as orange juice.  NO MINT OR MENTHOL PRODUCTS SO NO COUGH DROPS  USE SUGARLESS CANDY INSTEAD (jolley ranchers or Stover's)  NO OIL BASED VITAMINS   See calendar for specific medication instructions and bring it back for each and every office visit for every healthcare provider you see.  Without it,  you may not receive the best quality medical care that we feel you deserve.  You will note that the calendar groups together  your maintenance  medications that are timed at particular times of the day.  Think of this as your checklist for what your doctor has instructed you to do until your next evaluation to see what benefit  there is  to staying on a consistent group of medications intended to keep you well.  The other group at the bottom is entirely up to you to use as you see fit  for specific symptoms that may arise between visits that require you to treat them on an as needed basis.  Think of this as your action plan or "what if" list.   Separating the top medications from the bottom group is fundamental to providing you adequate care going forward.    Add chlortrimeton 4mg  one at bedtime and also use it during the day as needed when drainage, drippy nose, running eyes / itching/ sneezing Leave off nasonex for now and just use your saline irrigation as needed  Please schedule a follow up office visit in 6 weeks, call sooner if needed   Late add: consider D/C singulair at next ov as a reverse therapeutic trial to see if flares Not using nasonex appropriately so just leave it off for now while trying the H1 per guidelines

## 2010-05-18 NOTE — Progress Notes (Signed)
Subjective:    Patient ID: [name], female    DOB: 12-13-1945, 65 y.o.   MRN: [id number]  HPI   30 yowf never smoker with year round nose draining and cough x decades on zyrtec daily as maintenance with previous allergy eval inconclusive (mold and dust by Dr [name] and rx with inhalers seem to help coughing spells but no daily use.   March 02, 2010 1st pulmonary office eval cc acute worse x 3 weeks with bad cough assoc with nasal congestion, productive green mucus with pain on both flanks, low grade, weak couldn't sit up, no appetite rx as outpt abx x 2 and maybe prednisone shot > still coughing on day of ov but no fever or green mucus and overall much better. Start protonix 40 mg Take one 30-60 min before first meal of the day (prilosec otc 20 x 2)  Continue ranitidine 150 mg one at bedtime  Take mucinex 2 every 12 hours and add tramadol 50 mg up to every 4 hours  Please schedule a follow-up appointment in 2 weeks, sooner if needed with cxr on return and if at all possible bring your old cxr    March 19, 2010 ov cc dry cough/ throat congestion got some better and now losing ground despite ? adhering to rx, tried proaire no help whereas previously helped. not compliant with previous recs. still using oil based vitamins >>Rx Singulair , RAST and CT sinus , rec chlor tabs / med calendar  04/06/2010 ov/NP med review cc persistent cough and congestion. She underwent Sinus Ct which was neg for acute infection. Allergy profile was positive for grass, IGE was 38. Has been  seen by PCP felt to have sinus/bronchitis. Give Augmentin. She has now finished and cough/congestion is better But still has drippy nose/drainage. Feels it is due to high pollen counts.  Cough is mainly dry , has stuffy nose and some post nasal drainage.  Worse at night.  We reviewed her meds and organized them into med calendar She has not found the chlor tabs yet-as prescribed from last ov    05/18/2010 ov/ [name] cc cough is  much better but still having trouble with grass exposure the same as previous years not using nasonex though has it,  still chewing  Mint gum despite verbal/ written instructions to the contrary.  Not clear singulair is helping vs prev spring seasons. Pt denies any significant sore throat, dysphagia, itching, sneezing,  nasal congestion or excess/ purulent secretions,  fever, chills, sweats, unintended wt loss, pleuritic or exertional cp, hempoptysis, orthopnea pnd or leg swelling.    Also denies any obvious fluctuation of symptoms with weather or environmental changes or other aggravating or alleviating factors.  Sleeping ok without nocturnal  or early am exac of resp c/o's   Allergies   1) Prednisone    Past Medical History:  HYPERLIPIDEMIA (ICD-272.4)  ASTHMA (ICD-493.90)  Hx of PNEUMONIA (ICD-486)  Hx of PULMONARY EMBOLISM (ICD-415.19)  Hilar adenopathy by CT 02/24/2010  > ESR 22 03/19/10 > cxr nl 05/18/2010  Chronic cough x decades - Sinus CT neg March 19, 2010  - Allergy eval March 19, 2010 >>> IgE 38 pos only for grass  - Singulair trial March 19, 2010 >>>   Family History:  Reviewed history from 03/02/2010 and no changes required.  Colon CA-Cousin  Breast CA- Cousin  RA- Mother    Social History:  Reviewed history from 03/02/2010 and no changes required.  Married  Children  Never  smoker  No ETOH  Retired from [employer]                 Review of Systems     Objective:   Physical Exam Obese wf   failed to answer a single question asked in a straightforward manner, tending to go off on tangents or answer questions with ambiguous medical terms or diagnoses and seemed perplexed when asked the same question more than once for clarification.    wt 230 March 02, 2010 > 229 March 19, 2010 > 231 05/18/2010  HEENT: nl dentition and orophanx.Mod turbinate edema, nonspecific. Nl external ear canals without cough reflex  NECK : without JVD/Nodes/TM/ nl carotid  upstrokes bilaterally  LUNGS: no acc muscle use, clear to A and P bilaterally without cough on insp or exp maneuvers  CV: RRR no s3 or murmur or increase in P2, no edema  ABD: soft and nontender with nl excursion in the supine position. No bruits or organomegaly, bowel sounds nl  MS: warm without deformities, calf tenderness, cyanosis or clubbing     cxr 05/18/10 Comparison: 03/19/2010  Findings: The cardiac silhouette is normal in size and  configuration. There are no mediastinal or hilar masses or  pathologically enlarged lymph nodes. Minor scarring at the right  lateral lung base. Lungs otherwise clear. Bony thorax is  demineralized but intact. No change from the prior study.  IMPRESSION:  No active disease of the chest. No pulmonary nodule evident.   Assessment & Plan:

## 2010-06-29 ENCOUNTER — Ambulatory Visit: Payer: Medicare Other | Admitting: Internal Medicine

## 2010-09-03 ENCOUNTER — Other Ambulatory Visit (HOSPITAL_COMMUNITY): Payer: Medicare Other

## 2010-09-04 ENCOUNTER — Other Ambulatory Visit: Payer: Self-pay | Admitting: Orthopedic Surgery

## 2010-09-04 ENCOUNTER — Encounter (HOSPITAL_COMMUNITY): Payer: Medicare Other

## 2010-09-04 LAB — CBC
Hemoglobin: 14.1 g/dL (ref 12.0–15.0)
MCH: 28.2 pg (ref 26.0–34.0)
RBC: 5 MIL/uL (ref 3.87–5.11)
WBC: 6.7 10*3/uL (ref 4.0–10.5)

## 2010-09-04 LAB — URINALYSIS, ROUTINE W REFLEX MICROSCOPIC
Glucose, UA: NEGATIVE mg/dL
Leukocytes, UA: NEGATIVE
Nitrite: NEGATIVE
Protein, ur: NEGATIVE mg/dL
Urobilinogen, UA: 1 mg/dL (ref 0.0–1.0)

## 2010-09-04 LAB — COMPREHENSIVE METABOLIC PANEL
ALT: 29 U/L (ref 0–35)
AST: 25 U/L (ref 0–37)
Albumin: 3.8 g/dL (ref 3.5–5.2)
CO2: 27 mEq/L (ref 19–32)
Calcium: 9.4 mg/dL (ref 8.4–10.5)
Creatinine, Ser: 0.94 mg/dL (ref 0.50–1.10)
Sodium: 139 mEq/L (ref 135–145)
Total Protein: 7.1 g/dL (ref 6.0–8.3)

## 2010-09-04 LAB — PROTIME-INR: Prothrombin Time: 12.6 seconds (ref 11.6–15.2)

## 2010-09-04 LAB — SURGICAL PCR SCREEN: MRSA, PCR: NEGATIVE

## 2010-09-04 LAB — APTT: aPTT: 36 seconds (ref 24–37)

## 2010-09-09 ENCOUNTER — Inpatient Hospital Stay (HOSPITAL_COMMUNITY)
Admission: RE | Admit: 2010-09-09 | Discharge: 2010-09-13 | DRG: 470 | Disposition: A | Discharge status: Home Health | Payer: Medicare Other | Source: Ambulatory Visit | Attending: Orthopedic Surgery | Admitting: Orthopedic Surgery

## 2010-09-09 DIAGNOSIS — M1710 Unilateral primary osteoarthritis, unspecified knee: Principal | ICD-10-CM | POA: Diagnosis present

## 2010-09-09 DIAGNOSIS — Z86711 Personal history of pulmonary embolism: Secondary | ICD-10-CM

## 2010-09-09 DIAGNOSIS — I10 Essential (primary) hypertension: Secondary | ICD-10-CM | POA: Diagnosis present

## 2010-09-09 DIAGNOSIS — IMO0001 Reserved for inherently not codable concepts without codable children: Secondary | ICD-10-CM | POA: Diagnosis present

## 2010-09-09 DIAGNOSIS — E119 Type 2 diabetes mellitus without complications: Secondary | ICD-10-CM | POA: Diagnosis present

## 2010-09-09 DIAGNOSIS — R5381 Other malaise: Secondary | ICD-10-CM | POA: Diagnosis not present

## 2010-09-09 DIAGNOSIS — Z96659 Presence of unspecified artificial knee joint: Secondary | ICD-10-CM

## 2010-09-09 DIAGNOSIS — Z01812 Encounter for preprocedural laboratory examination: Secondary | ICD-10-CM

## 2010-09-09 DIAGNOSIS — K219 Gastro-esophageal reflux disease without esophagitis: Secondary | ICD-10-CM | POA: Diagnosis present

## 2010-09-09 LAB — ABO/RH: ABO/RH(D): B POS

## 2010-09-09 LAB — TYPE AND SCREEN: Antibody Screen: NEGATIVE

## 2010-09-10 LAB — CBC
Hemoglobin: 12.1 g/dL (ref 12.0–15.0)
Platelets: 293 10*3/uL (ref 150–400)
RBC: 4.41 MIL/uL (ref 3.87–5.11)
WBC: 9.6 10*3/uL (ref 4.0–10.5)

## 2010-09-10 LAB — BASIC METABOLIC PANEL
CO2: 31 mEq/L (ref 19–32)
Calcium: 9 mg/dL (ref 8.4–10.5)
Glucose, Bld: 160 mg/dL — ABNORMAL HIGH (ref 70–99)
Potassium: 4.6 mEq/L (ref 3.5–5.1)
Sodium: 141 mEq/L (ref 135–145)

## 2010-09-11 LAB — BASIC METABOLIC PANEL
CO2: 28 mEq/L (ref 19–32)
Calcium: 8.7 mg/dL (ref 8.4–10.5)
Chloride: 101 mEq/L (ref 96–112)
Creatinine, Ser: 0.63 mg/dL (ref 0.50–1.10)
Glucose, Bld: 160 mg/dL — ABNORMAL HIGH (ref 70–99)
Sodium: 136 mEq/L (ref 135–145)

## 2010-09-11 LAB — CBC
Hemoglobin: 11.2 g/dL — ABNORMAL LOW (ref 12.0–15.0)
MCH: 28 pg (ref 26.0–34.0)
MCV: 86.8 fL (ref 78.0–100.0)
Platelets: 237 10*3/uL (ref 150–400)
RBC: 4 MIL/uL (ref 3.87–5.11)
WBC: 9.8 10*3/uL (ref 4.0–10.5)

## 2010-09-12 LAB — CBC
HCT: 30.7 % — ABNORMAL LOW (ref 36.0–46.0)
MCH: 27.8 pg (ref 26.0–34.0)
MCV: 87 fL (ref 78.0–100.0)
RBC: 3.53 MIL/uL — ABNORMAL LOW (ref 3.87–5.11)
WBC: 7.9 10*3/uL (ref 4.0–10.5)

## 2010-09-12 LAB — BASIC METABOLIC PANEL
BUN: 10 mg/dL (ref 6–23)
CO2: 32 mEq/L (ref 19–32)
Calcium: 8.5 mg/dL (ref 8.4–10.5)
Chloride: 104 mEq/L (ref 96–112)
Creatinine, Ser: 0.69 mg/dL (ref 0.50–1.10)
Glucose, Bld: 132 mg/dL — ABNORMAL HIGH (ref 70–99)

## 2010-09-13 NOTE — Op Note (Signed)
[name], [name] NO.:  [id number]  MEDICAL RECORD NO.:  [id number]  LOCATION:  1601                         FACILITY:  [facility]  PHYSICIAN:  Ollen Gross, M.D.    DATE OF BIRTH:  09-29-1945  DATE OF PROCEDURE: DATE OF DISCHARGE:                              OPERATIVE REPORT   PREOPERATIVE DIAGNOSIS:  Osteoarthritis, right knee.  POSTOPERATIVE DIAGNOSIS:  Osteoarthritis, right knee.  PROCEDURE:  Right total knee arthroplasty.  SURGEON:  Ollen Gross, M.D.  ASSISTANT:  Alexzandrew L. Perkins, P.A.C.  ANESTHESIA:  Spinal.  ESTIMATED BLOOD LOSS:  Minimal.  DRAIN:  Hemovac x1.  TOURNIQUET TIME:  30 minutes at 3 mmHg.  COMPLICATIONS:  None.  CONDITION:  Stable to Recovery.  CLINICAL NOTE:  [name] is a 65 year old female with advanced end- stage arthritis of the right knee with progressively worsening pain and dysfunction.  She has bone-on-bone changes in the lateral patellofemoral compartments.  She has intractable pain.  She has had a previous successful left total knee arthroplasty.  She has no contraindications to surgery.  She presents now for right total knee arthroplasty.  PROCEDURE IN DETAIL:  After successful administration of spinal anesthetic, a tourniquet was placed on her right thigh and her right lower extremity was prepped and draped in usual sterile fashion. Extremity was wrapped in Esmarch, knee flexed, tourniquet inflated to 300 mmHg.  Midline incision was made with a 10 blade through subcutaneous tissue to the level of the extensor mechanism.  A fresh blade was used to make a medial parapatellar arthrotomy.  Soft tissue on the proximal medial tibia subperiosteally elevated to the joint line with the knife into the semimembranosus bursa with a Cobb elevator. Soft tissue laterally was elevated with attention being paid to avoid any patellar tendon on tibial tubercle.  The patella was everted, knee flexed 90 degrees, and ACL  and PCL removed.  Drill was used to create a starting hole in the distal femur and canal was thoroughly irrigated.  A 5 degree right valgus alignment guide was placed.  Distal femoral cutting block was placed to remove 11 mm off the distal femur.  Distal femoral resection was made with an oscillating saw.  The tibia subluxed forward and the menisci removed.  The extramedullary tibial alignment guide was placed referencing proximally at the medial aspect of the tibial tubercle and distally along the second metatarsal axis and tibial crest.  Block was pinned to remove 2 mm off the more deficient medial side.  Tibial resection was made with an oscillating saw.  Size 3 was most appropriate tibial component and proximal tibia was prepared with modular drill and keel punch for the size 3.  The femoral sizing guide was placed and size 3 was most appropriate there.  Rotations marked off the epicondylar axis and confirmed by creating rectangular flexion gap at 90 degrees.  The block was pinned in this rotation.  The anterior-posterior chamfer cuts were made. Intercondylar block was placed and that cut was made.  Trial size 3 posterior stabilized femur was placed.  10 mm posterior stabilized rotating platform insert trial was placed.  With the 10, there was a tiny bit of  varus-valgus play, so I went to a 12.5 which allowed for full extension with excellent varus-valgus and anterior-posterior balance throughout full range of motion.  Patella was everted, thickness measured 22 mm.  Freehand resection was taken to 13 mm, 35 template was placed, lug holes were drilled, trial patella was placed and it tracks normally.  Osteophytes removed off the posterior femur with the trial in place.  All trials were removed and the cut bone surfaces were prepared with pulsatile lavage.  Cement was mixed and once ready for implantation, the permanent size 3 mobile bearing tibial tray, size 3 posterior stabilized  femur, and 35 patella were cemented into place and the patella was held with a clamp.  Trial 12.5-mm insert was placed, knee held in full extension, and all extruded cement removed.  When the cement was fully hardened, then the permanent 12.5 mm posterior stabilized rotating platform insert was placed into the tibial tray. Wound was copiously irrigated with saline solution and the arthrotomy closed over Hemovac drain with interrupted #1 PDS.  Tourniquet was released total time of 30 minutes.  Flexion against gravity to 140 degrees and patella tracks normally.  Subcu was closed with interrupted 2-0 Vicryl and subcuticular running 4-0 Monocryl.  Catheter for Marcaine pain pump was placed and pumps initiated.  Incision was cleaned and dried, and Steri-Strips and bulky sterile dressing were applied.  She was then placed into a knee immobilizer, awakened, and transported to recovery in stable condition.     Ollen Gross, M.D.     FA/MEDQ  D:  09/09/2010  T:  09/09/2010  Job:  [id number]  Electronically Signed by Ollen Gross M.D. on 09/13/2010 12:19:44 PM

## 2010-09-23 NOTE — H&P (Signed)
NAMEANDRENA, [name]               ACCOUNT NO.:  [id number]  MEDICAL RECORD NO.:  [id number]  LOCATION:                               FACILITY:  [facility]  PHYSICIAN:  Alexzandrew L. Perkins, P.A.C.DATE OF BIRTH:  1945-09-21  DATE OF ADMISSION:  09/09/2010 DATE OF DISCHARGE:                             HISTORY & PHYSICAL   DATE OF ADMISSION:  September 09, 2010  CHIEF COMPLAINT:  Right knee pain.  HISTORY OF PRESENT ILLNESS:  The patient is a 65 year old female who been seen by Dr. Lequita Halt for ongoing right knee pain.  She has been seen as a second opinion.  History dates back several years.  With regards to the right knee, the pain has gotten worse with time.  She has been found in the office to have progressive arthritis with the right knee.  She is already bone-on-bone patellofemoral with advancing medial and lateral joint arthritis and has started to limit her function and mobility.  She has undergone conservative treatment for this for quite some time now including cortisone injections or viscous supplementations without benefit.  It is felt at this point she would benefit from undergoing surgical intervention.  Risks and benefits have been discussed.  She elects to proceed with surgery.  No ongoing contraindications such as ongoing infection or progressive neurological disease.  ALLERGIES:  No known drug allergies.  Intolerances:  PREDNISONE causes her to be "mean."  Also morphine causes sedation.  CURRENT MEDICATIONS: 1. Trilipix 135 mg p.o. daily. 2. Citalopram 40 mg p.o. daily. 3. Vitamin D 50,000 units every week. 4. Losartan 50 mg daily. 5. Furosemide 20 mg daily. 6. Ranitidine 300 mg at bedtime. 7. Nasonex 50 mcg 2 sprays each nostril daily. 8. Hydrocodone 5 mg 1 to 2 daily. 9. Zyrtec 10 mg daily. 10.ProAir 90 mcg inhaler 2 puffs every 4 to 6 hours as needed.  PAST MEDICAL HISTORY:  Vertigo, tinnitus, asthma, bronchitis, history of pneumonia, hypertension,  hypercholesterolemia, reflux disease, hemorrhoids, history of pulmonary embolism secondary to oral contraception at age 13, fibromyalgia.  Childhood illnesses of chickenpox, measles, and mumps.  PAST SURGICAL HISTORY:  She has had arthroscopies on both knees.  She also has undergone a left total knee replacement, left shoulder surgery, right thumb surgery, tonsillectomy, tubal ligation.  She did have some history of oversedation following her previous total knee surgery.  FAMILY HISTORY:  Mother deceased at age 67 with arthritis and a history of heart attack.  SOCIAL HISTORY:  Married, nonsmoker.  No alcohol.  Plans to go home with her husband.  She does have a ramp entering her home.  REVIEW OF SYSTEMS:  GENERAL:  No fevers, chills or night sweats.  NEURO: Has a little bit of tremor.  No seizure, syncope or paralysis. RESPIRATORY:  A little bit of shortness of breath on exertion with some occasional wheeze.  She does have medications.  But no shortness of breath, no productive cough or hemoptysis.  CARDIOVASCULAR:  No chest pain or orthopnea.  GI:  She gets a little bit of occasional heartburn and reflux.  No nausea, vomiting diarrhea or constipation.  GU:  A little bit of frequency.  No dysuria or  hematuria.  MUSCULOSKELETAL: Knee pain.  PHYSICAL EXAMINATION:  VITAL SIGNS:  Pulse 64, respirations 12, blood pressure 124/64. GENERAL:  65 year old white female, well-nourished, well-developed, in no acute distress.  She is alert, oriented and cooperative, pleasant, mildly anxious. HEENT:  Normocephalic, atraumatic.  Pupils round, reactive.  EOMs intact.  Notable for glasses. NECK:  Supple.  No carotid bruits. CHEST:  Clear. HEART:  Regular rate and rhythm without murmur, S1, S2 noted. ABDOMEN:  Soft, nontender, slightly round.  Bowel sounds present. RECTAL, BREASTS, GENITALIA:  Not done.  Not pertinent to present illness. EXTREMITIES:  Right knee:  No effusion, marked  patellofemoral crepitus, no deformities.  Range of motion 10 to 120.  Left knee: Well-healed scar.  Range of motion of 5 to 120.  No tenderness, no instability.  IMPRESSION:  Osteoarthritis, right knee.  PLAN:  The patient will be admitted to [facility] to undergo right total knee replacement arthroplasty.  Surgery will be performed by Dr. Ollen Gross.  Dictated For:  Ollen Gross, MD     Alexzandrew L. Perkins, P.A.C.     ALP/MEDQ  D:  09/07/2010  T:  09/07/2010  Job:  [id number]  cc:   Charlaine Dalton. Sherene Sires, MD, FCCP [address] [place] [address]  [facility] [address] [place], [place] [address]  [facility] [address] [place], [place] [address]  Electronically Signed by Patrica Duel P.A.C. on 09/09/2010 10:01:41 AM Electronically Signed by Ollen Gross M.D. on 09/23/2010 10:08:02 AM

## 2010-10-14 NOTE — Discharge Summary (Signed)
[name], [name] NO.:  [id number]  MEDICAL RECORD NO.:  [id number]  LOCATION:  1601                         FACILITY:  [facility]  PHYSICIAN:  Ollen Gross, M.D.    DATE OF BIRTH:  [birth year]  DATE OF ADMISSION:  09/09/2010 DATE OF DISCHARGE:  09/13/2010                              DISCHARGE SUMMARY   ADMITTING DIAGNOSES: 1. Osteoarthritis of right knee. 2. Vertigo. 3. Tinnitus. 4. Asthma. 5. Bronchitis. 6. History of pneumonia. 7. Hypertension. 8. Hypercholesterolemia. 9. Reflux disease. 10.Hemorrhoids. 11.History of pulmonary embolism. 12.Fibromyalgia. 13.Childhood illnesses of chickenpox, measles, mumps.  DISCHARGE DIAGNOSES: 1. Osteoarthritis of right knee, status post right total knee     replacement and arthroplasty. 2. Postoperative acute blood loss anemia. 3. Vertigo. 4. Tinnitus. 5. Asthma. 6. Bronchitis. 7. History of pneumonia. 8. Hypertension. 9. Hypercholesterolemia.10.Reflux disease. 11.Hemorrhoids. 12.History of pulmonary embolism. 13.Fibromyalgia. 14.Childhood illnesses of chickenpox, measles, mumps.  PROCEDURE:  On September 09, 2010, right total knee.  SURGEON:  Ollen Gross, M.D.  ASSISTANT:  Alexzandrew L. Perkins, P.A.C.  ANESTHESIA:  Spinal anesthesia.  TOURNIQUET TIME:  30 minutes.  CONSULTS:  None.  BRIEF HISTORY:  The patient is a 65 year old female with advanced arthritis of right knee, progressive worsening pain with dysfunction, bone-on-bone changes, has intractable pain; previous successful left total knee, now presents for right total knee.  LABORATORY DATA:  Admission CBC not scanned into this chart.  Postop hemoglobin 12.1, then 11.2.  Last drawn hemoglobin and hematocrit 9.8 and 30.7.  Chem panel on admission not scanned into chart.  Serial BMETs were followed.  Electrolytes all remained within normal limits. Creatinine was at 1.28 on day #1, but it came down to 0.6 on day #3. Blood group type B  positive.  HOSPITAL COURSE:  The patient was admitted to [facility], taken to OR, underwent above-stated procedure without complication.  The patient tolerated the procedure well, later was transferred to the recovery room in the orthopedic floor.  She became a little oversedated through the evening of surgery and did require Narcan, but much more alert on the morning of day #1, so we discontinued the PCA and changed around her p.o. medications.  She was started on Xarelto for DVT prophylaxis.  Hemoglobin looked good.  Started to get up out of bed by day #2.  She had a little bit of nausea.  Pain was improving.  She was slow to progress due to the initial sedation and the nausea, but started to get up with therapy by day#3.  She still had some intermittent nausea and not ready to go home.  Continued to work with therapy and working on pain control.  By day #4, though the nausea had essentially resolved, she was doing much better with her mobility, and then discharged home.  DISCHARGE/PLAN:  The patient was discharged home on September 13, 2010.  DISCHARGE DIAGNOSES:  Please see above.  DISCHARGE MEDICATIONS:  Vicodin, Robaxin, Xarelto, Celexa, furosemide, losartan, Nasonex, ProAir, ranitidine, Trilipix, Zyrtec.  DIET:  Heart-healthy diet.  ACTIVITY:  Weightbearing as tolerated.  Total knee protocol.  FOLLOWUP:  Follow up in 2 weeks.  DISPOSITION:  Home.  CONDITION ON DISCHARGE:  Improved.  Alexzandrew L. Julien Girt, P.A.C.   ______________________________ Ollen Gross, M.D.    ALP/MEDQ  D:  10/01/2010  T:  10/01/2010  Job:  [id number]  Electronically Signed by Patrica Duel P.A.C. on 10/05/2010 04:27:34 PM Electronically Signed by Ollen Gross M.D. on 10/14/2010 11:15:58 AM

## 2011-02-11 DIAGNOSIS — J0100 Acute maxillary sinusitis, unspecified: Secondary | ICD-10-CM | POA: Diagnosis not present

## 2011-03-15 DIAGNOSIS — J069 Acute upper respiratory infection, unspecified: Secondary | ICD-10-CM | POA: Diagnosis not present

## 2011-03-15 DIAGNOSIS — I10 Essential (primary) hypertension: Secondary | ICD-10-CM | POA: Diagnosis not present

## 2011-03-15 DIAGNOSIS — K219 Gastro-esophageal reflux disease without esophagitis: Secondary | ICD-10-CM | POA: Diagnosis not present

## 2011-03-15 DIAGNOSIS — Z79899 Other long term (current) drug therapy: Secondary | ICD-10-CM | POA: Diagnosis not present

## 2011-03-15 DIAGNOSIS — F411 Generalized anxiety disorder: Secondary | ICD-10-CM | POA: Diagnosis not present

## 2011-03-15 DIAGNOSIS — E782 Mixed hyperlipidemia: Secondary | ICD-10-CM | POA: Diagnosis not present

## 2011-03-30 DIAGNOSIS — M1710 Unilateral primary osteoarthritis, unspecified knee: Secondary | ICD-10-CM | POA: Diagnosis not present

## 2011-04-05 DIAGNOSIS — M5137 Other intervertebral disc degeneration, lumbosacral region: Secondary | ICD-10-CM | POA: Diagnosis not present

## 2011-04-08 DIAGNOSIS — M5137 Other intervertebral disc degeneration, lumbosacral region: Secondary | ICD-10-CM | POA: Diagnosis not present

## 2011-04-12 DIAGNOSIS — M5137 Other intervertebral disc degeneration, lumbosacral region: Secondary | ICD-10-CM | POA: Diagnosis not present

## 2011-04-15 DIAGNOSIS — M5137 Other intervertebral disc degeneration, lumbosacral region: Secondary | ICD-10-CM | POA: Diagnosis not present

## 2011-04-19 DIAGNOSIS — M5137 Other intervertebral disc degeneration, lumbosacral region: Secondary | ICD-10-CM | POA: Diagnosis not present

## 2011-04-26 DIAGNOSIS — M5137 Other intervertebral disc degeneration, lumbosacral region: Secondary | ICD-10-CM | POA: Diagnosis not present

## 2011-04-29 DIAGNOSIS — M5137 Other intervertebral disc degeneration, lumbosacral region: Secondary | ICD-10-CM | POA: Diagnosis not present

## 2011-05-17 DIAGNOSIS — R109 Unspecified abdominal pain: Secondary | ICD-10-CM | POA: Diagnosis not present

## 2011-05-17 DIAGNOSIS — K625 Hemorrhage of anus and rectum: Secondary | ICD-10-CM | POA: Diagnosis not present

## 2011-05-17 DIAGNOSIS — F411 Generalized anxiety disorder: Secondary | ICD-10-CM | POA: Diagnosis not present

## 2011-06-09 DIAGNOSIS — K602 Anal fissure, unspecified: Secondary | ICD-10-CM | POA: Diagnosis not present

## 2011-07-05 DIAGNOSIS — E782 Mixed hyperlipidemia: Secondary | ICD-10-CM | POA: Diagnosis not present

## 2011-07-05 DIAGNOSIS — R7309 Other abnormal glucose: Secondary | ICD-10-CM | POA: Diagnosis not present

## 2011-07-05 DIAGNOSIS — I10 Essential (primary) hypertension: Secondary | ICD-10-CM | POA: Diagnosis not present

## 2011-07-05 DIAGNOSIS — R5383 Other fatigue: Secondary | ICD-10-CM | POA: Diagnosis not present

## 2011-07-05 DIAGNOSIS — R7989 Other specified abnormal findings of blood chemistry: Secondary | ICD-10-CM | POA: Diagnosis not present

## 2011-07-05 DIAGNOSIS — R5381 Other malaise: Secondary | ICD-10-CM | POA: Diagnosis not present

## 2011-07-05 DIAGNOSIS — E785 Hyperlipidemia, unspecified: Secondary | ICD-10-CM | POA: Diagnosis not present

## 2011-07-05 DIAGNOSIS — E538 Deficiency of other specified B group vitamins: Secondary | ICD-10-CM | POA: Diagnosis not present

## 2011-07-05 DIAGNOSIS — Z79899 Other long term (current) drug therapy: Secondary | ICD-10-CM | POA: Diagnosis not present

## 2011-07-05 DIAGNOSIS — J069 Acute upper respiratory infection, unspecified: Secondary | ICD-10-CM | POA: Diagnosis not present

## 2011-07-05 DIAGNOSIS — K219 Gastro-esophageal reflux disease without esophagitis: Secondary | ICD-10-CM | POA: Diagnosis not present

## 2011-07-05 DIAGNOSIS — F411 Generalized anxiety disorder: Secondary | ICD-10-CM | POA: Diagnosis not present

## 2011-07-21 DIAGNOSIS — K921 Melena: Secondary | ICD-10-CM | POA: Diagnosis not present

## 2011-09-15 DIAGNOSIS — E538 Deficiency of other specified B group vitamins: Secondary | ICD-10-CM | POA: Diagnosis not present

## 2011-09-15 DIAGNOSIS — I10 Essential (primary) hypertension: Secondary | ICD-10-CM | POA: Diagnosis not present

## 2011-09-15 DIAGNOSIS — E559 Vitamin D deficiency, unspecified: Secondary | ICD-10-CM | POA: Diagnosis not present

## 2011-09-15 DIAGNOSIS — R5383 Other fatigue: Secondary | ICD-10-CM | POA: Diagnosis not present

## 2011-09-15 DIAGNOSIS — R5381 Other malaise: Secondary | ICD-10-CM | POA: Diagnosis not present

## 2011-09-15 DIAGNOSIS — E785 Hyperlipidemia, unspecified: Secondary | ICD-10-CM | POA: Diagnosis not present

## 2011-09-29 DIAGNOSIS — M6530 Trigger finger, unspecified finger: Secondary | ICD-10-CM | POA: Diagnosis not present

## 2011-09-29 DIAGNOSIS — M79609 Pain in unspecified limb: Secondary | ICD-10-CM | POA: Diagnosis not present

## 2011-09-29 DIAGNOSIS — G5600 Carpal tunnel syndrome, unspecified upper limb: Secondary | ICD-10-CM | POA: Diagnosis not present

## 2011-10-13 DIAGNOSIS — J069 Acute upper respiratory infection, unspecified: Secondary | ICD-10-CM | POA: Diagnosis not present

## 2011-10-13 DIAGNOSIS — Z1231 Encounter for screening mammogram for malignant neoplasm of breast: Secondary | ICD-10-CM | POA: Diagnosis not present

## 2011-10-13 DIAGNOSIS — E782 Mixed hyperlipidemia: Secondary | ICD-10-CM | POA: Diagnosis not present

## 2011-10-13 DIAGNOSIS — I10 Essential (primary) hypertension: Secondary | ICD-10-CM | POA: Diagnosis not present

## 2011-10-13 DIAGNOSIS — E538 Deficiency of other specified B group vitamins: Secondary | ICD-10-CM | POA: Diagnosis not present

## 2011-10-13 DIAGNOSIS — Z23 Encounter for immunization: Secondary | ICD-10-CM | POA: Diagnosis not present

## 2011-10-13 DIAGNOSIS — E785 Hyperlipidemia, unspecified: Secondary | ICD-10-CM | POA: Diagnosis not present

## 2011-10-28 DIAGNOSIS — M1710 Unilateral primary osteoarthritis, unspecified knee: Secondary | ICD-10-CM | POA: Diagnosis not present

## 2011-11-01 DIAGNOSIS — M6530 Trigger finger, unspecified finger: Secondary | ICD-10-CM | POA: Diagnosis not present

## 2011-11-01 DIAGNOSIS — G5600 Carpal tunnel syndrome, unspecified upper limb: Secondary | ICD-10-CM | POA: Diagnosis not present

## 2011-11-29 DIAGNOSIS — J0100 Acute maxillary sinusitis, unspecified: Secondary | ICD-10-CM | POA: Diagnosis not present

## 2011-11-29 DIAGNOSIS — E538 Deficiency of other specified B group vitamins: Secondary | ICD-10-CM | POA: Diagnosis not present

## 2012-01-19 DIAGNOSIS — E538 Deficiency of other specified B group vitamins: Secondary | ICD-10-CM | POA: Diagnosis not present

## 2012-01-19 DIAGNOSIS — E785 Hyperlipidemia, unspecified: Secondary | ICD-10-CM | POA: Diagnosis not present

## 2012-01-19 DIAGNOSIS — I10 Essential (primary) hypertension: Secondary | ICD-10-CM | POA: Diagnosis not present

## 2012-01-19 DIAGNOSIS — Z79899 Other long term (current) drug therapy: Secondary | ICD-10-CM | POA: Diagnosis not present

## 2012-01-19 DIAGNOSIS — J069 Acute upper respiratory infection, unspecified: Secondary | ICD-10-CM | POA: Diagnosis not present

## 2012-01-19 DIAGNOSIS — E782 Mixed hyperlipidemia: Secondary | ICD-10-CM | POA: Diagnosis not present

## 2012-01-19 DIAGNOSIS — F411 Generalized anxiety disorder: Secondary | ICD-10-CM | POA: Diagnosis not present

## 2012-01-19 DIAGNOSIS — Z23 Encounter for immunization: Secondary | ICD-10-CM | POA: Diagnosis not present

## 2012-01-19 DIAGNOSIS — E559 Vitamin D deficiency, unspecified: Secondary | ICD-10-CM | POA: Diagnosis not present

## 2012-01-19 DIAGNOSIS — R7989 Other specified abnormal findings of blood chemistry: Secondary | ICD-10-CM | POA: Diagnosis not present

## 2012-01-26 DIAGNOSIS — J069 Acute upper respiratory infection, unspecified: Secondary | ICD-10-CM | POA: Diagnosis not present

## 2012-03-29 DIAGNOSIS — K644 Residual hemorrhoidal skin tags: Secondary | ICD-10-CM | POA: Diagnosis not present

## 2012-04-18 DIAGNOSIS — J019 Acute sinusitis, unspecified: Secondary | ICD-10-CM | POA: Diagnosis not present

## 2012-05-01 DIAGNOSIS — E550 Rickets, active: Secondary | ICD-10-CM | POA: Diagnosis not present

## 2012-05-01 DIAGNOSIS — R5383 Other fatigue: Secondary | ICD-10-CM | POA: Diagnosis not present

## 2012-05-01 DIAGNOSIS — E782 Mixed hyperlipidemia: Secondary | ICD-10-CM | POA: Diagnosis not present

## 2012-05-01 DIAGNOSIS — F411 Generalized anxiety disorder: Secondary | ICD-10-CM | POA: Diagnosis not present

## 2012-05-01 DIAGNOSIS — R030 Elevated blood-pressure reading, without diagnosis of hypertension: Secondary | ICD-10-CM | POA: Diagnosis not present

## 2012-05-01 DIAGNOSIS — E559 Vitamin D deficiency, unspecified: Secondary | ICD-10-CM | POA: Diagnosis not present

## 2012-05-01 DIAGNOSIS — I10 Essential (primary) hypertension: Secondary | ICD-10-CM | POA: Diagnosis not present

## 2012-05-01 DIAGNOSIS — E538 Deficiency of other specified B group vitamins: Secondary | ICD-10-CM | POA: Diagnosis not present

## 2012-05-01 DIAGNOSIS — R7301 Impaired fasting glucose: Secondary | ICD-10-CM | POA: Diagnosis not present

## 2012-05-22 DIAGNOSIS — Z0000 Encounter for general adult medical examination without abnormal findings: Secondary | ICD-10-CM | POA: Diagnosis not present

## 2012-05-22 DIAGNOSIS — Z01419 Encounter for gynecological examination (general) (routine) without abnormal findings: Secondary | ICD-10-CM | POA: Diagnosis not present

## 2012-05-29 DIAGNOSIS — Z1231 Encounter for screening mammogram for malignant neoplasm of breast: Secondary | ICD-10-CM | POA: Diagnosis not present

## 2012-06-05 DIAGNOSIS — K921 Melena: Secondary | ICD-10-CM | POA: Diagnosis not present

## 2012-07-27 DIAGNOSIS — L820 Inflamed seborrheic keratosis: Secondary | ICD-10-CM | POA: Diagnosis not present

## 2012-07-27 DIAGNOSIS — D2360 Other benign neoplasm of skin of unspecified upper limb, including shoulder: Secondary | ICD-10-CM | POA: Diagnosis not present

## 2012-07-27 DIAGNOSIS — D485 Neoplasm of uncertain behavior of skin: Secondary | ICD-10-CM | POA: Diagnosis not present

## 2012-08-15 DIAGNOSIS — R7309 Other abnormal glucose: Secondary | ICD-10-CM | POA: Diagnosis not present

## 2012-08-15 DIAGNOSIS — E559 Vitamin D deficiency, unspecified: Secondary | ICD-10-CM | POA: Diagnosis not present

## 2012-08-15 DIAGNOSIS — I10 Essential (primary) hypertension: Secondary | ICD-10-CM | POA: Diagnosis not present

## 2012-08-15 DIAGNOSIS — E785 Hyperlipidemia, unspecified: Secondary | ICD-10-CM | POA: Diagnosis not present

## 2012-08-15 DIAGNOSIS — F411 Generalized anxiety disorder: Secondary | ICD-10-CM | POA: Diagnosis not present

## 2012-08-15 DIAGNOSIS — E782 Mixed hyperlipidemia: Secondary | ICD-10-CM | POA: Diagnosis not present

## 2012-09-15 DIAGNOSIS — E781 Pure hyperglyceridemia: Secondary | ICD-10-CM | POA: Diagnosis not present

## 2012-09-15 DIAGNOSIS — R072 Precordial pain: Secondary | ICD-10-CM | POA: Diagnosis not present

## 2012-09-15 DIAGNOSIS — R0789 Other chest pain: Secondary | ICD-10-CM | POA: Diagnosis not present

## 2012-09-15 DIAGNOSIS — R9431 Abnormal electrocardiogram [ECG] [EKG]: Secondary | ICD-10-CM | POA: Diagnosis not present

## 2012-09-15 DIAGNOSIS — R079 Chest pain, unspecified: Secondary | ICD-10-CM | POA: Diagnosis not present

## 2012-09-15 DIAGNOSIS — K219 Gastro-esophageal reflux disease without esophagitis: Secondary | ICD-10-CM | POA: Diagnosis not present

## 2012-09-15 DIAGNOSIS — Z79899 Other long term (current) drug therapy: Secondary | ICD-10-CM | POA: Diagnosis not present

## 2012-09-15 DIAGNOSIS — R51 Headache: Secondary | ICD-10-CM | POA: Diagnosis not present

## 2012-09-15 DIAGNOSIS — F329 Major depressive disorder, single episode, unspecified: Secondary | ICD-10-CM | POA: Diagnosis not present

## 2012-09-15 DIAGNOSIS — M1990 Unspecified osteoarthritis, unspecified site: Secondary | ICD-10-CM | POA: Diagnosis not present

## 2012-09-15 DIAGNOSIS — I10 Essential (primary) hypertension: Secondary | ICD-10-CM | POA: Diagnosis not present

## 2012-09-15 DIAGNOSIS — R0602 Shortness of breath: Secondary | ICD-10-CM | POA: Diagnosis not present

## 2012-09-15 DIAGNOSIS — I674 Hypertensive encephalopathy: Secondary | ICD-10-CM | POA: Diagnosis not present

## 2012-09-15 DIAGNOSIS — F29 Unspecified psychosis not due to a substance or known physiological condition: Secondary | ICD-10-CM | POA: Diagnosis not present

## 2012-09-16 DIAGNOSIS — R0789 Other chest pain: Secondary | ICD-10-CM | POA: Diagnosis not present

## 2012-09-16 DIAGNOSIS — E781 Pure hyperglyceridemia: Secondary | ICD-10-CM | POA: Diagnosis not present

## 2012-09-16 DIAGNOSIS — R079 Chest pain, unspecified: Secondary | ICD-10-CM | POA: Diagnosis not present

## 2012-09-16 DIAGNOSIS — F329 Major depressive disorder, single episode, unspecified: Secondary | ICD-10-CM | POA: Diagnosis not present

## 2012-09-16 DIAGNOSIS — I10 Essential (primary) hypertension: Secondary | ICD-10-CM | POA: Diagnosis not present

## 2012-09-19 DIAGNOSIS — M1710 Unilateral primary osteoarthritis, unspecified knee: Secondary | ICD-10-CM | POA: Diagnosis not present

## 2012-09-21 DIAGNOSIS — I10 Essential (primary) hypertension: Secondary | ICD-10-CM | POA: Diagnosis not present

## 2012-09-21 DIAGNOSIS — R072 Precordial pain: Secondary | ICD-10-CM | POA: Diagnosis not present

## 2012-09-21 DIAGNOSIS — Z79899 Other long term (current) drug therapy: Secondary | ICD-10-CM | POA: Diagnosis not present

## 2012-09-21 DIAGNOSIS — R5381 Other malaise: Secondary | ICD-10-CM | POA: Diagnosis not present

## 2012-09-29 DIAGNOSIS — R42 Dizziness and giddiness: Secondary | ICD-10-CM | POA: Diagnosis not present

## 2012-09-29 DIAGNOSIS — I10 Essential (primary) hypertension: Secondary | ICD-10-CM | POA: Diagnosis not present

## 2012-10-10 DIAGNOSIS — I808 Phlebitis and thrombophlebitis of other sites: Secondary | ICD-10-CM | POA: Diagnosis not present

## 2012-10-10 DIAGNOSIS — M25539 Pain in unspecified wrist: Secondary | ICD-10-CM | POA: Diagnosis not present

## 2012-10-10 DIAGNOSIS — R609 Edema, unspecified: Secondary | ICD-10-CM | POA: Diagnosis not present

## 2012-10-10 DIAGNOSIS — M7989 Other specified soft tissue disorders: Secondary | ICD-10-CM | POA: Diagnosis not present

## 2012-10-10 DIAGNOSIS — M79609 Pain in unspecified limb: Secondary | ICD-10-CM | POA: Diagnosis not present

## 2012-10-19 DIAGNOSIS — R0602 Shortness of breath: Secondary | ICD-10-CM | POA: Diagnosis not present

## 2012-10-19 DIAGNOSIS — I10 Essential (primary) hypertension: Secondary | ICD-10-CM | POA: Diagnosis not present

## 2012-10-19 DIAGNOSIS — Z23 Encounter for immunization: Secondary | ICD-10-CM | POA: Diagnosis not present

## 2012-10-19 DIAGNOSIS — R609 Edema, unspecified: Secondary | ICD-10-CM | POA: Diagnosis not present

## 2012-11-02 DIAGNOSIS — I809 Phlebitis and thrombophlebitis of unspecified site: Secondary | ICD-10-CM | POA: Diagnosis not present

## 2012-11-07 DIAGNOSIS — R1013 Epigastric pain: Secondary | ICD-10-CM | POA: Diagnosis not present

## 2012-11-07 DIAGNOSIS — M79609 Pain in unspecified limb: Secondary | ICD-10-CM | POA: Diagnosis not present

## 2012-11-07 DIAGNOSIS — S9030XA Contusion of unspecified foot, initial encounter: Secondary | ICD-10-CM | POA: Diagnosis not present

## 2012-11-07 DIAGNOSIS — R10816 Epigastric abdominal tenderness: Secondary | ICD-10-CM | POA: Diagnosis not present

## 2012-11-07 DIAGNOSIS — R609 Edema, unspecified: Secondary | ICD-10-CM | POA: Diagnosis not present

## 2012-11-09 DIAGNOSIS — I059 Rheumatic mitral valve disease, unspecified: Secondary | ICD-10-CM | POA: Diagnosis not present

## 2012-11-09 DIAGNOSIS — R609 Edema, unspecified: Secondary | ICD-10-CM | POA: Diagnosis not present

## 2012-11-10 DIAGNOSIS — R0602 Shortness of breath: Secondary | ICD-10-CM | POA: Diagnosis not present

## 2012-11-10 DIAGNOSIS — R609 Edema, unspecified: Secondary | ICD-10-CM | POA: Diagnosis not present

## 2012-11-10 DIAGNOSIS — I10 Essential (primary) hypertension: Secondary | ICD-10-CM | POA: Diagnosis not present

## 2012-11-28 DIAGNOSIS — E782 Mixed hyperlipidemia: Secondary | ICD-10-CM | POA: Diagnosis not present

## 2012-11-28 DIAGNOSIS — E785 Hyperlipidemia, unspecified: Secondary | ICD-10-CM | POA: Diagnosis not present

## 2012-11-28 DIAGNOSIS — Z79899 Other long term (current) drug therapy: Secondary | ICD-10-CM | POA: Diagnosis not present

## 2012-11-28 DIAGNOSIS — I10 Essential (primary) hypertension: Secondary | ICD-10-CM | POA: Diagnosis not present

## 2012-11-28 DIAGNOSIS — F411 Generalized anxiety disorder: Secondary | ICD-10-CM | POA: Diagnosis not present

## 2012-11-28 DIAGNOSIS — E559 Vitamin D deficiency, unspecified: Secondary | ICD-10-CM | POA: Diagnosis not present

## 2012-12-12 DIAGNOSIS — R05 Cough: Secondary | ICD-10-CM | POA: Diagnosis not present

## 2012-12-12 DIAGNOSIS — J0100 Acute maxillary sinusitis, unspecified: Secondary | ICD-10-CM | POA: Diagnosis not present

## 2013-01-31 DIAGNOSIS — I319 Disease of pericardium, unspecified: Secondary | ICD-10-CM | POA: Diagnosis not present

## 2013-01-31 DIAGNOSIS — R0602 Shortness of breath: Secondary | ICD-10-CM | POA: Diagnosis not present

## 2013-01-31 DIAGNOSIS — R609 Edema, unspecified: Secondary | ICD-10-CM | POA: Diagnosis not present

## 2013-01-31 DIAGNOSIS — I10 Essential (primary) hypertension: Secondary | ICD-10-CM | POA: Diagnosis not present

## 2013-02-12 DIAGNOSIS — K644 Residual hemorrhoidal skin tags: Secondary | ICD-10-CM | POA: Diagnosis not present

## 2013-02-12 DIAGNOSIS — Z86010 Personal history of colonic polyps: Secondary | ICD-10-CM | POA: Diagnosis not present

## 2013-02-12 DIAGNOSIS — K921 Melena: Secondary | ICD-10-CM | POA: Diagnosis not present

## 2013-02-12 DIAGNOSIS — K648 Other hemorrhoids: Secondary | ICD-10-CM | POA: Diagnosis not present

## 2013-02-22 DIAGNOSIS — J069 Acute upper respiratory infection, unspecified: Secondary | ICD-10-CM | POA: Diagnosis not present

## 2013-03-07 IMAGING — CR DG CHEST 2V
2 series · 2 of 2 positions shown · non-contrast
Comparison: 03/19/2010

CLINICAL DATA: Follow-up nodule.  No complaints.

CHEST - 2 VIEW

[view not recorded (1 of 2)]
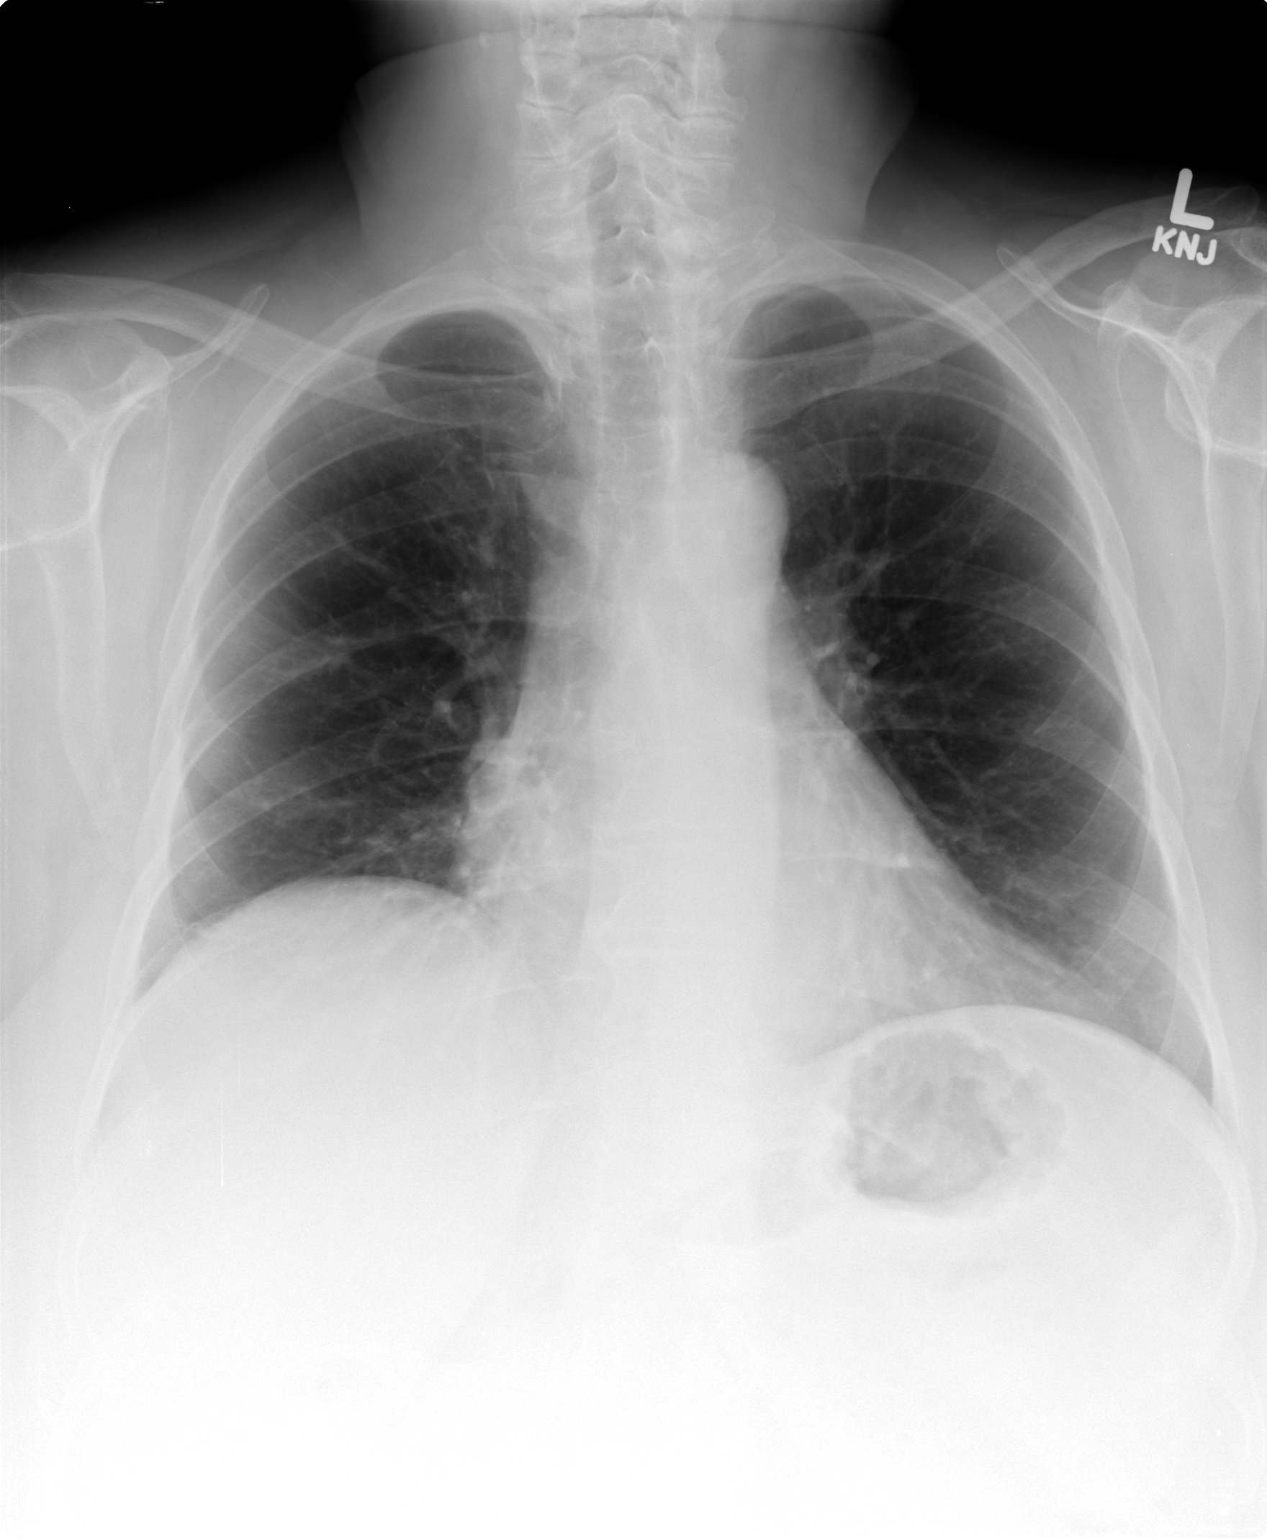

[view not recorded (2 of 2)]
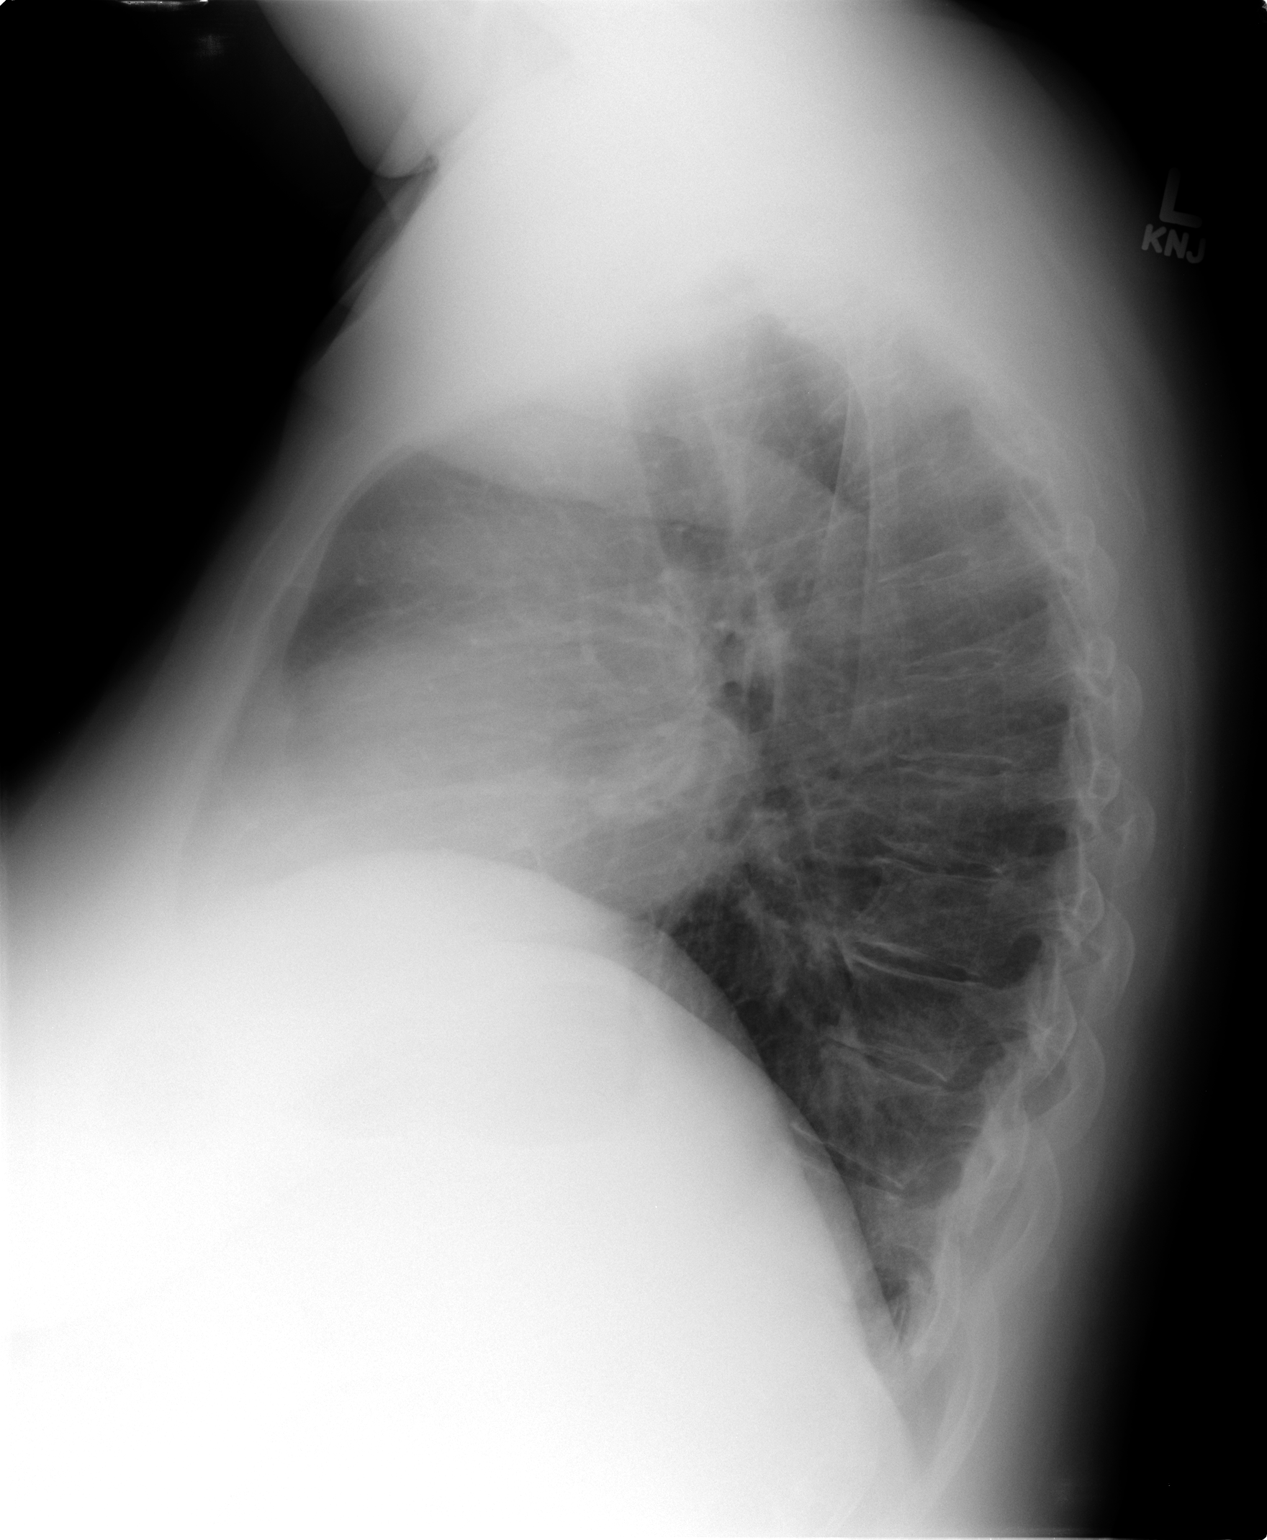

[2 of 2 positions shown; findings below may reference images not displayed]

FINDINGS: The cardiac silhouette is normal in size and
configuration.  There are no mediastinal or hilar masses or
pathologically enlarged lymph nodes.  Minor scarring at the right
lateral lung base.  Lungs otherwise clear.  Bony thorax is
demineralized but intact.  No change from the prior study.
IMPRESSION: No active disease of the chest.  No pulmonary nodule evident.

## 2013-03-08 DIAGNOSIS — R5383 Other fatigue: Secondary | ICD-10-CM | POA: Diagnosis not present

## 2013-03-08 DIAGNOSIS — E559 Vitamin D deficiency, unspecified: Secondary | ICD-10-CM | POA: Diagnosis not present

## 2013-03-08 DIAGNOSIS — R7309 Other abnormal glucose: Secondary | ICD-10-CM | POA: Diagnosis not present

## 2013-03-08 DIAGNOSIS — E782 Mixed hyperlipidemia: Secondary | ICD-10-CM | POA: Diagnosis not present

## 2013-03-08 DIAGNOSIS — R5381 Other malaise: Secondary | ICD-10-CM | POA: Diagnosis not present

## 2013-03-08 DIAGNOSIS — R7989 Other specified abnormal findings of blood chemistry: Secondary | ICD-10-CM | POA: Diagnosis not present

## 2013-03-08 DIAGNOSIS — F411 Generalized anxiety disorder: Secondary | ICD-10-CM | POA: Diagnosis not present

## 2013-03-08 DIAGNOSIS — Z79899 Other long term (current) drug therapy: Secondary | ICD-10-CM | POA: Diagnosis not present

## 2013-03-08 DIAGNOSIS — E785 Hyperlipidemia, unspecified: Secondary | ICD-10-CM | POA: Diagnosis not present

## 2013-03-08 DIAGNOSIS — I10 Essential (primary) hypertension: Secondary | ICD-10-CM | POA: Diagnosis not present

## 2013-03-28 DIAGNOSIS — K602 Anal fissure, unspecified: Secondary | ICD-10-CM | POA: Diagnosis not present

## 2013-03-28 DIAGNOSIS — Z86010 Personal history of colonic polyps: Secondary | ICD-10-CM | POA: Diagnosis not present

## 2013-03-28 DIAGNOSIS — K921 Melena: Secondary | ICD-10-CM | POA: Diagnosis not present

## 2013-04-06 DIAGNOSIS — J0100 Acute maxillary sinusitis, unspecified: Secondary | ICD-10-CM | POA: Diagnosis not present

## 2013-05-10 DIAGNOSIS — R5381 Other malaise: Secondary | ICD-10-CM | POA: Diagnosis not present

## 2013-05-10 DIAGNOSIS — R209 Unspecified disturbances of skin sensation: Secondary | ICD-10-CM | POA: Diagnosis not present

## 2013-05-10 DIAGNOSIS — R5383 Other fatigue: Secondary | ICD-10-CM | POA: Diagnosis not present

## 2013-05-17 DIAGNOSIS — R209 Unspecified disturbances of skin sensation: Secondary | ICD-10-CM | POA: Diagnosis not present

## 2013-05-21 DIAGNOSIS — J069 Acute upper respiratory infection, unspecified: Secondary | ICD-10-CM | POA: Diagnosis not present

## 2013-06-21 DIAGNOSIS — R5383 Other fatigue: Secondary | ICD-10-CM | POA: Diagnosis not present

## 2013-06-21 DIAGNOSIS — R5381 Other malaise: Secondary | ICD-10-CM | POA: Diagnosis not present

## 2013-06-21 DIAGNOSIS — R609 Edema, unspecified: Secondary | ICD-10-CM | POA: Diagnosis not present

## 2013-06-21 DIAGNOSIS — E785 Hyperlipidemia, unspecified: Secondary | ICD-10-CM | POA: Diagnosis not present

## 2013-06-21 DIAGNOSIS — F411 Generalized anxiety disorder: Secondary | ICD-10-CM | POA: Diagnosis not present

## 2013-06-21 DIAGNOSIS — I10 Essential (primary) hypertension: Secondary | ICD-10-CM | POA: Diagnosis not present

## 2013-06-21 DIAGNOSIS — E782 Mixed hyperlipidemia: Secondary | ICD-10-CM | POA: Diagnosis not present

## 2013-06-21 DIAGNOSIS — R0602 Shortness of breath: Secondary | ICD-10-CM | POA: Diagnosis not present

## 2013-06-21 DIAGNOSIS — I509 Heart failure, unspecified: Secondary | ICD-10-CM | POA: Diagnosis not present

## 2013-06-21 DIAGNOSIS — Z79899 Other long term (current) drug therapy: Secondary | ICD-10-CM | POA: Diagnosis not present

## 2013-06-21 DIAGNOSIS — E559 Vitamin D deficiency, unspecified: Secondary | ICD-10-CM | POA: Diagnosis not present

## 2013-06-21 DIAGNOSIS — R7309 Other abnormal glucose: Secondary | ICD-10-CM | POA: Diagnosis not present

## 2013-07-05 DIAGNOSIS — Z1231 Encounter for screening mammogram for malignant neoplasm of breast: Secondary | ICD-10-CM | POA: Diagnosis not present

## 2013-07-10 DIAGNOSIS — Z79899 Other long term (current) drug therapy: Secondary | ICD-10-CM | POA: Diagnosis not present

## 2013-07-10 DIAGNOSIS — I10 Essential (primary) hypertension: Secondary | ICD-10-CM | POA: Diagnosis not present

## 2013-07-10 DIAGNOSIS — E7800 Pure hypercholesterolemia, unspecified: Secondary | ICD-10-CM | POA: Diagnosis not present

## 2013-07-10 DIAGNOSIS — S81009A Unspecified open wound, unspecified knee, initial encounter: Secondary | ICD-10-CM | POA: Diagnosis not present

## 2013-07-10 DIAGNOSIS — IMO0002 Reserved for concepts with insufficient information to code with codable children: Secondary | ICD-10-CM | POA: Diagnosis not present

## 2013-07-10 DIAGNOSIS — K219 Gastro-esophageal reflux disease without esophagitis: Secondary | ICD-10-CM | POA: Diagnosis not present

## 2013-08-10 DIAGNOSIS — J0100 Acute maxillary sinusitis, unspecified: Secondary | ICD-10-CM | POA: Diagnosis not present

## 2013-09-27 DIAGNOSIS — E782 Mixed hyperlipidemia: Secondary | ICD-10-CM | POA: Diagnosis not present

## 2013-09-27 DIAGNOSIS — F411 Generalized anxiety disorder: Secondary | ICD-10-CM | POA: Diagnosis not present

## 2013-09-27 DIAGNOSIS — Z79899 Other long term (current) drug therapy: Secondary | ICD-10-CM | POA: Diagnosis not present

## 2013-09-27 DIAGNOSIS — J069 Acute upper respiratory infection, unspecified: Secondary | ICD-10-CM | POA: Diagnosis not present

## 2013-09-27 DIAGNOSIS — E559 Vitamin D deficiency, unspecified: Secondary | ICD-10-CM | POA: Diagnosis not present

## 2013-09-27 DIAGNOSIS — K644 Residual hemorrhoidal skin tags: Secondary | ICD-10-CM | POA: Diagnosis not present

## 2013-09-27 DIAGNOSIS — E785 Hyperlipidemia, unspecified: Secondary | ICD-10-CM | POA: Diagnosis not present

## 2013-09-27 DIAGNOSIS — I10 Essential (primary) hypertension: Secondary | ICD-10-CM | POA: Diagnosis not present

## 2013-10-02 DIAGNOSIS — I10 Essential (primary) hypertension: Secondary | ICD-10-CM | POA: Diagnosis not present

## 2013-10-02 DIAGNOSIS — R0602 Shortness of breath: Secondary | ICD-10-CM | POA: Diagnosis not present

## 2013-10-02 DIAGNOSIS — I319 Disease of pericardium, unspecified: Secondary | ICD-10-CM | POA: Diagnosis not present

## 2013-10-09 DIAGNOSIS — K642 Third degree hemorrhoids: Secondary | ICD-10-CM | POA: Diagnosis not present

## 2013-10-09 DIAGNOSIS — K6289 Other specified diseases of anus and rectum: Secondary | ICD-10-CM | POA: Diagnosis not present

## 2013-10-09 DIAGNOSIS — K602 Anal fissure, unspecified: Secondary | ICD-10-CM | POA: Diagnosis not present

## 2013-10-09 DIAGNOSIS — I10 Essential (primary) hypertension: Secondary | ICD-10-CM | POA: Diagnosis not present

## 2013-10-19 DIAGNOSIS — J069 Acute upper respiratory infection, unspecified: Secondary | ICD-10-CM | POA: Diagnosis not present

## 2013-10-22 DIAGNOSIS — Z79899 Other long term (current) drug therapy: Secondary | ICD-10-CM | POA: Diagnosis not present

## 2013-10-22 DIAGNOSIS — Z86711 Personal history of pulmonary embolism: Secondary | ICD-10-CM | POA: Diagnosis not present

## 2013-10-22 DIAGNOSIS — M549 Dorsalgia, unspecified: Secondary | ICD-10-CM | POA: Diagnosis not present

## 2013-10-22 DIAGNOSIS — K642 Third degree hemorrhoids: Secondary | ICD-10-CM | POA: Diagnosis not present

## 2013-10-22 DIAGNOSIS — K649 Unspecified hemorrhoids: Secondary | ICD-10-CM | POA: Diagnosis not present

## 2013-10-22 DIAGNOSIS — K6289 Other specified diseases of anus and rectum: Secondary | ICD-10-CM | POA: Diagnosis not present

## 2013-10-22 DIAGNOSIS — J45909 Unspecified asthma, uncomplicated: Secondary | ICD-10-CM | POA: Diagnosis not present

## 2013-10-22 DIAGNOSIS — G8929 Other chronic pain: Secondary | ICD-10-CM | POA: Diagnosis not present

## 2013-10-22 DIAGNOSIS — I10 Essential (primary) hypertension: Secondary | ICD-10-CM | POA: Diagnosis not present

## 2013-10-22 DIAGNOSIS — Z6838 Body mass index (BMI) 38.0-38.9, adult: Secondary | ICD-10-CM | POA: Diagnosis not present

## 2013-10-22 DIAGNOSIS — Z7982 Long term (current) use of aspirin: Secondary | ICD-10-CM | POA: Diagnosis not present

## 2013-10-22 DIAGNOSIS — K644 Residual hemorrhoidal skin tags: Secondary | ICD-10-CM | POA: Diagnosis not present

## 2013-10-22 DIAGNOSIS — K643 Fourth degree hemorrhoids: Secondary | ICD-10-CM | POA: Diagnosis not present

## 2013-10-22 DIAGNOSIS — K219 Gastro-esophageal reflux disease without esophagitis: Secondary | ICD-10-CM | POA: Diagnosis not present

## 2013-10-22 DIAGNOSIS — E669 Obesity, unspecified: Secondary | ICD-10-CM | POA: Diagnosis not present

## 2013-12-06 DIAGNOSIS — L821 Other seborrheic keratosis: Secondary | ICD-10-CM | POA: Diagnosis not present

## 2014-01-01 DIAGNOSIS — F411 Generalized anxiety disorder: Secondary | ICD-10-CM | POA: Diagnosis not present

## 2014-01-01 DIAGNOSIS — J069 Acute upper respiratory infection, unspecified: Secondary | ICD-10-CM | POA: Diagnosis not present

## 2014-01-01 DIAGNOSIS — E559 Vitamin D deficiency, unspecified: Secondary | ICD-10-CM | POA: Diagnosis not present

## 2014-01-01 DIAGNOSIS — I10 Essential (primary) hypertension: Secondary | ICD-10-CM | POA: Diagnosis not present

## 2014-01-01 DIAGNOSIS — E782 Mixed hyperlipidemia: Secondary | ICD-10-CM | POA: Diagnosis not present

## 2014-02-07 DIAGNOSIS — R946 Abnormal results of thyroid function studies: Secondary | ICD-10-CM | POA: Diagnosis not present

## 2014-04-16 DIAGNOSIS — F411 Generalized anxiety disorder: Secondary | ICD-10-CM | POA: Diagnosis not present

## 2014-04-16 DIAGNOSIS — E559 Vitamin D deficiency, unspecified: Secondary | ICD-10-CM | POA: Diagnosis not present

## 2014-04-16 DIAGNOSIS — R7301 Impaired fasting glucose: Secondary | ICD-10-CM | POA: Diagnosis not present

## 2014-04-16 DIAGNOSIS — R10811 Right upper quadrant abdominal tenderness: Secondary | ICD-10-CM | POA: Diagnosis not present

## 2014-04-16 DIAGNOSIS — E782 Mixed hyperlipidemia: Secondary | ICD-10-CM | POA: Diagnosis not present

## 2014-04-16 DIAGNOSIS — I10 Essential (primary) hypertension: Secondary | ICD-10-CM | POA: Diagnosis not present

## 2014-04-16 DIAGNOSIS — E539 Vitamin B deficiency, unspecified: Secondary | ICD-10-CM | POA: Diagnosis not present

## 2014-04-16 DIAGNOSIS — R1013 Epigastric pain: Secondary | ICD-10-CM | POA: Diagnosis not present

## 2014-04-30 DIAGNOSIS — K760 Fatty (change of) liver, not elsewhere classified: Secondary | ICD-10-CM | POA: Diagnosis not present

## 2014-04-30 DIAGNOSIS — R1012 Left upper quadrant pain: Secondary | ICD-10-CM | POA: Diagnosis not present

## 2014-04-30 DIAGNOSIS — K8020 Calculus of gallbladder without cholecystitis without obstruction: Secondary | ICD-10-CM | POA: Diagnosis not present

## 2014-04-30 DIAGNOSIS — R10814 Left lower quadrant abdominal tenderness: Secondary | ICD-10-CM | POA: Diagnosis not present

## 2014-05-02 DIAGNOSIS — D485 Neoplasm of uncertain behavior of skin: Secondary | ICD-10-CM | POA: Diagnosis not present

## 2014-05-22 DIAGNOSIS — D131 Benign neoplasm of stomach: Secondary | ICD-10-CM | POA: Diagnosis not present

## 2014-05-22 DIAGNOSIS — K219 Gastro-esophageal reflux disease without esophagitis: Secondary | ICD-10-CM | POA: Diagnosis not present

## 2014-05-22 DIAGNOSIS — R110 Nausea: Secondary | ICD-10-CM | POA: Diagnosis not present

## 2014-06-07 DIAGNOSIS — K219 Gastro-esophageal reflux disease without esophagitis: Secondary | ICD-10-CM | POA: Diagnosis not present

## 2014-06-07 DIAGNOSIS — K2970 Gastritis, unspecified, without bleeding: Secondary | ICD-10-CM | POA: Diagnosis not present

## 2014-06-07 DIAGNOSIS — I10 Essential (primary) hypertension: Secondary | ICD-10-CM | POA: Diagnosis not present

## 2014-06-07 DIAGNOSIS — K449 Diaphragmatic hernia without obstruction or gangrene: Secondary | ICD-10-CM | POA: Diagnosis not present

## 2014-06-07 DIAGNOSIS — Z800 Family history of malignant neoplasm of digestive organs: Secondary | ICD-10-CM | POA: Diagnosis not present

## 2014-06-07 DIAGNOSIS — J45909 Unspecified asthma, uncomplicated: Secondary | ICD-10-CM | POA: Diagnosis not present

## 2014-06-07 DIAGNOSIS — R1012 Left upper quadrant pain: Secondary | ICD-10-CM | POA: Diagnosis not present

## 2014-06-07 DIAGNOSIS — K2990 Gastroduodenitis, unspecified, without bleeding: Secondary | ICD-10-CM | POA: Diagnosis not present

## 2014-06-18 DIAGNOSIS — R600 Localized edema: Secondary | ICD-10-CM | POA: Diagnosis not present

## 2014-06-18 DIAGNOSIS — M79605 Pain in left leg: Secondary | ICD-10-CM | POA: Diagnosis not present

## 2014-06-18 DIAGNOSIS — R0602 Shortness of breath: Secondary | ICD-10-CM | POA: Diagnosis not present

## 2014-06-18 DIAGNOSIS — I313 Pericardial effusion (noninflammatory): Secondary | ICD-10-CM | POA: Diagnosis not present

## 2014-06-18 DIAGNOSIS — M79604 Pain in right leg: Secondary | ICD-10-CM | POA: Diagnosis not present

## 2014-06-27 DIAGNOSIS — R0602 Shortness of breath: Secondary | ICD-10-CM | POA: Diagnosis not present

## 2014-07-10 DIAGNOSIS — K2990 Gastroduodenitis, unspecified, without bleeding: Secondary | ICD-10-CM | POA: Diagnosis not present

## 2014-07-10 DIAGNOSIS — Z86010 Personal history of colonic polyps: Secondary | ICD-10-CM | POA: Diagnosis not present

## 2014-07-10 DIAGNOSIS — K219 Gastro-esophageal reflux disease without esophagitis: Secondary | ICD-10-CM | POA: Diagnosis not present

## 2014-07-23 DIAGNOSIS — R7301 Impaired fasting glucose: Secondary | ICD-10-CM | POA: Diagnosis not present

## 2014-07-23 DIAGNOSIS — I10 Essential (primary) hypertension: Secondary | ICD-10-CM | POA: Diagnosis not present

## 2014-07-23 DIAGNOSIS — E559 Vitamin D deficiency, unspecified: Secondary | ICD-10-CM | POA: Diagnosis not present

## 2014-07-23 DIAGNOSIS — E782 Mixed hyperlipidemia: Secondary | ICD-10-CM | POA: Diagnosis not present

## 2014-07-23 DIAGNOSIS — F411 Generalized anxiety disorder: Secondary | ICD-10-CM | POA: Diagnosis not present

## 2014-10-28 DIAGNOSIS — E782 Mixed hyperlipidemia: Secondary | ICD-10-CM | POA: Diagnosis not present

## 2014-10-28 DIAGNOSIS — F411 Generalized anxiety disorder: Secondary | ICD-10-CM | POA: Diagnosis not present

## 2014-10-28 DIAGNOSIS — I10 Essential (primary) hypertension: Secondary | ICD-10-CM | POA: Diagnosis not present

## 2014-10-28 DIAGNOSIS — R7301 Impaired fasting glucose: Secondary | ICD-10-CM | POA: Diagnosis not present

## 2014-10-28 DIAGNOSIS — E559 Vitamin D deficiency, unspecified: Secondary | ICD-10-CM | POA: Diagnosis not present

## 2014-11-05 DIAGNOSIS — E538 Deficiency of other specified B group vitamins: Secondary | ICD-10-CM | POA: Diagnosis not present

## 2014-11-08 DIAGNOSIS — Z96651 Presence of right artificial knee joint: Secondary | ICD-10-CM | POA: Diagnosis not present

## 2014-11-08 DIAGNOSIS — Z471 Aftercare following joint replacement surgery: Secondary | ICD-10-CM | POA: Diagnosis not present

## 2014-12-18 DIAGNOSIS — R0602 Shortness of breath: Secondary | ICD-10-CM | POA: Diagnosis not present

## 2014-12-18 DIAGNOSIS — R600 Localized edema: Secondary | ICD-10-CM | POA: Diagnosis not present

## 2014-12-18 DIAGNOSIS — I313 Pericardial effusion (noninflammatory): Secondary | ICD-10-CM | POA: Diagnosis not present

## 2015-01-28 DIAGNOSIS — J069 Acute upper respiratory infection, unspecified: Secondary | ICD-10-CM | POA: Diagnosis not present

## 2015-01-28 DIAGNOSIS — E782 Mixed hyperlipidemia: Secondary | ICD-10-CM | POA: Diagnosis not present

## 2015-01-28 DIAGNOSIS — J060 Acute laryngopharyngitis: Secondary | ICD-10-CM | POA: Diagnosis not present

## 2015-01-28 DIAGNOSIS — F411 Generalized anxiety disorder: Secondary | ICD-10-CM | POA: Diagnosis not present

## 2015-01-28 DIAGNOSIS — E559 Vitamin D deficiency, unspecified: Secondary | ICD-10-CM | POA: Diagnosis not present

## 2015-01-28 DIAGNOSIS — R7301 Impaired fasting glucose: Secondary | ICD-10-CM | POA: Diagnosis not present

## 2015-01-28 DIAGNOSIS — I10 Essential (primary) hypertension: Secondary | ICD-10-CM | POA: Diagnosis not present

## 2015-02-26 DIAGNOSIS — J060 Acute laryngopharyngitis: Secondary | ICD-10-CM | POA: Diagnosis not present

## 2015-02-26 DIAGNOSIS — M25519 Pain in unspecified shoulder: Secondary | ICD-10-CM | POA: Diagnosis not present

## 2015-02-26 DIAGNOSIS — J069 Acute upper respiratory infection, unspecified: Secondary | ICD-10-CM | POA: Diagnosis not present

## 2015-02-26 DIAGNOSIS — M25511 Pain in right shoulder: Secondary | ICD-10-CM | POA: Diagnosis not present

## 2015-04-29 DIAGNOSIS — Z1231 Encounter for screening mammogram for malignant neoplasm of breast: Secondary | ICD-10-CM | POA: Diagnosis not present

## 2015-04-30 DIAGNOSIS — F411 Generalized anxiety disorder: Secondary | ICD-10-CM | POA: Diagnosis not present

## 2015-04-30 DIAGNOSIS — E559 Vitamin D deficiency, unspecified: Secondary | ICD-10-CM | POA: Diagnosis not present

## 2015-04-30 DIAGNOSIS — E782 Mixed hyperlipidemia: Secondary | ICD-10-CM | POA: Diagnosis not present

## 2015-04-30 DIAGNOSIS — R7301 Impaired fasting glucose: Secondary | ICD-10-CM | POA: Diagnosis not present

## 2015-04-30 DIAGNOSIS — E119 Type 2 diabetes mellitus without complications: Secondary | ICD-10-CM | POA: Diagnosis not present

## 2015-04-30 DIAGNOSIS — E539 Vitamin B deficiency, unspecified: Secondary | ICD-10-CM | POA: Diagnosis not present

## 2015-04-30 DIAGNOSIS — I10 Essential (primary) hypertension: Secondary | ICD-10-CM | POA: Diagnosis not present

## 2015-04-30 DIAGNOSIS — K219 Gastro-esophageal reflux disease without esophagitis: Secondary | ICD-10-CM | POA: Diagnosis not present

## 2015-05-02 DIAGNOSIS — E538 Deficiency of other specified B group vitamins: Secondary | ICD-10-CM | POA: Diagnosis not present

## 2015-06-09 DIAGNOSIS — M2010 Hallux valgus (acquired), unspecified foot: Secondary | ICD-10-CM | POA: Insufficient documentation

## 2015-06-09 DIAGNOSIS — M79671 Pain in right foot: Secondary | ICD-10-CM | POA: Diagnosis not present

## 2015-07-04 DIAGNOSIS — R600 Localized edema: Secondary | ICD-10-CM | POA: Diagnosis not present

## 2015-07-04 DIAGNOSIS — R0609 Other forms of dyspnea: Secondary | ICD-10-CM | POA: Diagnosis not present

## 2015-07-04 DIAGNOSIS — R61 Generalized hyperhidrosis: Secondary | ICD-10-CM | POA: Diagnosis not present

## 2015-07-31 DIAGNOSIS — R938 Abnormal findings on diagnostic imaging of other specified body structures: Secondary | ICD-10-CM | POA: Diagnosis not present

## 2015-07-31 DIAGNOSIS — I10 Essential (primary) hypertension: Secondary | ICD-10-CM | POA: Diagnosis not present

## 2015-07-31 DIAGNOSIS — E782 Mixed hyperlipidemia: Secondary | ICD-10-CM | POA: Diagnosis not present

## 2015-07-31 DIAGNOSIS — F411 Generalized anxiety disorder: Secondary | ICD-10-CM | POA: Diagnosis not present

## 2015-07-31 DIAGNOSIS — R5383 Other fatigue: Secondary | ICD-10-CM | POA: Diagnosis not present

## 2015-07-31 DIAGNOSIS — E538 Deficiency of other specified B group vitamins: Secondary | ICD-10-CM | POA: Diagnosis not present

## 2015-07-31 DIAGNOSIS — E119 Type 2 diabetes mellitus without complications: Secondary | ICD-10-CM | POA: Diagnosis not present

## 2015-08-04 DIAGNOSIS — E538 Deficiency of other specified B group vitamins: Secondary | ICD-10-CM | POA: Diagnosis not present

## 2015-08-04 DIAGNOSIS — R918 Other nonspecific abnormal finding of lung field: Secondary | ICD-10-CM | POA: Diagnosis not present

## 2015-08-04 DIAGNOSIS — R911 Solitary pulmonary nodule: Secondary | ICD-10-CM | POA: Diagnosis not present

## 2015-08-11 DIAGNOSIS — E119 Type 2 diabetes mellitus without complications: Secondary | ICD-10-CM | POA: Diagnosis not present

## 2015-08-26 DIAGNOSIS — J018 Other acute sinusitis: Secondary | ICD-10-CM | POA: Diagnosis not present

## 2015-08-26 DIAGNOSIS — E119 Type 2 diabetes mellitus without complications: Secondary | ICD-10-CM | POA: Diagnosis not present

## 2015-08-26 DIAGNOSIS — J019 Acute sinusitis, unspecified: Secondary | ICD-10-CM | POA: Diagnosis not present

## 2015-09-01 ENCOUNTER — Other Ambulatory Visit: Payer: Self-pay

## 2015-09-09 DIAGNOSIS — E538 Deficiency of other specified B group vitamins: Secondary | ICD-10-CM | POA: Diagnosis not present

## 2015-09-16 DIAGNOSIS — E119 Type 2 diabetes mellitus without complications: Secondary | ICD-10-CM | POA: Diagnosis not present

## 2015-09-29 DIAGNOSIS — E119 Type 2 diabetes mellitus without complications: Secondary | ICD-10-CM | POA: Diagnosis not present

## 2015-10-16 DIAGNOSIS — E538 Deficiency of other specified B group vitamins: Secondary | ICD-10-CM | POA: Diagnosis not present

## 2015-11-03 DIAGNOSIS — F411 Generalized anxiety disorder: Secondary | ICD-10-CM | POA: Diagnosis not present

## 2015-11-03 DIAGNOSIS — E119 Type 2 diabetes mellitus without complications: Secondary | ICD-10-CM | POA: Diagnosis not present

## 2015-11-03 DIAGNOSIS — E782 Mixed hyperlipidemia: Secondary | ICD-10-CM | POA: Diagnosis not present

## 2015-11-03 DIAGNOSIS — E559 Vitamin D deficiency, unspecified: Secondary | ICD-10-CM | POA: Diagnosis not present

## 2015-11-03 DIAGNOSIS — I10 Essential (primary) hypertension: Secondary | ICD-10-CM | POA: Diagnosis not present

## 2015-12-01 DIAGNOSIS — E538 Deficiency of other specified B group vitamins: Secondary | ICD-10-CM | POA: Diagnosis not present

## 2015-12-27 DIAGNOSIS — J069 Acute upper respiratory infection, unspecified: Secondary | ICD-10-CM | POA: Diagnosis not present

## 2016-01-27 DIAGNOSIS — J060 Acute laryngopharyngitis: Secondary | ICD-10-CM | POA: Diagnosis not present

## 2016-01-27 DIAGNOSIS — J069 Acute upper respiratory infection, unspecified: Secondary | ICD-10-CM | POA: Diagnosis not present

## 2016-02-04 DIAGNOSIS — R5383 Other fatigue: Secondary | ICD-10-CM | POA: Diagnosis not present

## 2016-02-04 DIAGNOSIS — I10 Essential (primary) hypertension: Secondary | ICD-10-CM | POA: Diagnosis not present

## 2016-02-04 DIAGNOSIS — E119 Type 2 diabetes mellitus without complications: Secondary | ICD-10-CM | POA: Diagnosis not present

## 2016-02-04 DIAGNOSIS — E559 Vitamin D deficiency, unspecified: Secondary | ICD-10-CM | POA: Diagnosis not present

## 2016-02-04 DIAGNOSIS — F411 Generalized anxiety disorder: Secondary | ICD-10-CM | POA: Diagnosis not present

## 2016-02-04 DIAGNOSIS — E538 Deficiency of other specified B group vitamins: Secondary | ICD-10-CM | POA: Diagnosis not present

## 2016-02-04 DIAGNOSIS — J060 Acute laryngopharyngitis: Secondary | ICD-10-CM | POA: Diagnosis not present

## 2016-02-04 DIAGNOSIS — E782 Mixed hyperlipidemia: Secondary | ICD-10-CM | POA: Diagnosis not present

## 2016-02-17 DIAGNOSIS — J111 Influenza due to unidentified influenza virus with other respiratory manifestations: Secondary | ICD-10-CM | POA: Diagnosis not present

## 2016-02-17 DIAGNOSIS — J101 Influenza due to other identified influenza virus with other respiratory manifestations: Secondary | ICD-10-CM | POA: Diagnosis not present

## 2016-03-08 DIAGNOSIS — J060 Acute laryngopharyngitis: Secondary | ICD-10-CM | POA: Diagnosis not present

## 2016-03-08 DIAGNOSIS — J069 Acute upper respiratory infection, unspecified: Secondary | ICD-10-CM | POA: Diagnosis not present

## 2016-03-14 DIAGNOSIS — S61411A Laceration without foreign body of right hand, initial encounter: Secondary | ICD-10-CM | POA: Diagnosis not present

## 2016-03-29 DIAGNOSIS — E538 Deficiency of other specified B group vitamins: Secondary | ICD-10-CM | POA: Diagnosis not present

## 2016-04-06 DIAGNOSIS — Z471 Aftercare following joint replacement surgery: Secondary | ICD-10-CM | POA: Diagnosis not present

## 2016-04-06 DIAGNOSIS — S8001XA Contusion of right knee, initial encounter: Secondary | ICD-10-CM | POA: Diagnosis not present

## 2016-04-06 DIAGNOSIS — Z96651 Presence of right artificial knee joint: Secondary | ICD-10-CM | POA: Diagnosis not present

## 2016-05-04 DIAGNOSIS — E538 Deficiency of other specified B group vitamins: Secondary | ICD-10-CM | POA: Diagnosis not present

## 2016-05-04 DIAGNOSIS — F411 Generalized anxiety disorder: Secondary | ICD-10-CM | POA: Diagnosis not present

## 2016-05-04 DIAGNOSIS — E782 Mixed hyperlipidemia: Secondary | ICD-10-CM | POA: Diagnosis not present

## 2016-05-04 DIAGNOSIS — E559 Vitamin D deficiency, unspecified: Secondary | ICD-10-CM | POA: Diagnosis not present

## 2016-05-04 DIAGNOSIS — R5383 Other fatigue: Secondary | ICD-10-CM | POA: Diagnosis not present

## 2016-05-04 DIAGNOSIS — I10 Essential (primary) hypertension: Secondary | ICD-10-CM | POA: Diagnosis not present

## 2016-05-04 DIAGNOSIS — Z780 Asymptomatic menopausal state: Secondary | ICD-10-CM | POA: Diagnosis not present

## 2016-05-04 DIAGNOSIS — E119 Type 2 diabetes mellitus without complications: Secondary | ICD-10-CM | POA: Diagnosis not present

## 2016-05-05 DIAGNOSIS — E538 Deficiency of other specified B group vitamins: Secondary | ICD-10-CM | POA: Diagnosis not present

## 2016-05-13 DIAGNOSIS — Z1231 Encounter for screening mammogram for malignant neoplasm of breast: Secondary | ICD-10-CM | POA: Diagnosis not present

## 2016-05-20 DIAGNOSIS — Z87898 Personal history of other specified conditions: Secondary | ICD-10-CM | POA: Diagnosis not present

## 2016-05-20 DIAGNOSIS — R1310 Dysphagia, unspecified: Secondary | ICD-10-CM | POA: Diagnosis not present

## 2016-05-20 DIAGNOSIS — J3489 Other specified disorders of nose and nasal sinuses: Secondary | ICD-10-CM | POA: Diagnosis not present

## 2016-05-20 DIAGNOSIS — R0989 Other specified symptoms and signs involving the circulatory and respiratory systems: Secondary | ICD-10-CM | POA: Diagnosis not present

## 2016-05-20 DIAGNOSIS — J342 Deviated nasal septum: Secondary | ICD-10-CM | POA: Diagnosis not present

## 2016-05-20 DIAGNOSIS — M542 Cervicalgia: Secondary | ICD-10-CM | POA: Diagnosis not present

## 2016-06-04 DIAGNOSIS — M1712 Unilateral primary osteoarthritis, left knee: Secondary | ICD-10-CM | POA: Diagnosis not present

## 2016-06-04 DIAGNOSIS — M545 Low back pain: Secondary | ICD-10-CM | POA: Diagnosis not present

## 2016-06-07 DIAGNOSIS — E538 Deficiency of other specified B group vitamins: Secondary | ICD-10-CM | POA: Diagnosis not present

## 2016-06-10 DIAGNOSIS — M545 Low back pain: Secondary | ICD-10-CM | POA: Diagnosis not present

## 2016-06-12 DIAGNOSIS — J309 Allergic rhinitis, unspecified: Secondary | ICD-10-CM | POA: Diagnosis not present

## 2016-06-23 DIAGNOSIS — M48061 Spinal stenosis, lumbar region without neurogenic claudication: Secondary | ICD-10-CM | POA: Diagnosis not present

## 2016-06-23 DIAGNOSIS — M545 Low back pain: Secondary | ICD-10-CM | POA: Diagnosis not present

## 2016-06-23 DIAGNOSIS — M47817 Spondylosis without myelopathy or radiculopathy, lumbosacral region: Secondary | ICD-10-CM | POA: Diagnosis not present

## 2016-07-06 DIAGNOSIS — H2512 Age-related nuclear cataract, left eye: Secondary | ICD-10-CM | POA: Diagnosis not present

## 2016-07-06 DIAGNOSIS — H353221 Exudative age-related macular degeneration, left eye, with active choroidal neovascularization: Secondary | ICD-10-CM | POA: Diagnosis not present

## 2016-07-08 DIAGNOSIS — H353221 Exudative age-related macular degeneration, left eye, with active choroidal neovascularization: Secondary | ICD-10-CM | POA: Diagnosis not present

## 2016-07-12 DIAGNOSIS — E538 Deficiency of other specified B group vitamins: Secondary | ICD-10-CM | POA: Diagnosis not present

## 2016-08-10 DIAGNOSIS — E559 Vitamin D deficiency, unspecified: Secondary | ICD-10-CM | POA: Diagnosis not present

## 2016-08-10 DIAGNOSIS — R5383 Other fatigue: Secondary | ICD-10-CM | POA: Diagnosis not present

## 2016-08-10 DIAGNOSIS — E119 Type 2 diabetes mellitus without complications: Secondary | ICD-10-CM | POA: Diagnosis not present

## 2016-08-10 DIAGNOSIS — F411 Generalized anxiety disorder: Secondary | ICD-10-CM | POA: Diagnosis not present

## 2016-08-10 DIAGNOSIS — I10 Essential (primary) hypertension: Secondary | ICD-10-CM | POA: Diagnosis not present

## 2016-08-10 DIAGNOSIS — E782 Mixed hyperlipidemia: Secondary | ICD-10-CM | POA: Diagnosis not present

## 2016-08-11 DIAGNOSIS — E538 Deficiency of other specified B group vitamins: Secondary | ICD-10-CM | POA: Diagnosis not present

## 2016-08-17 DIAGNOSIS — S90861A Insect bite (nonvenomous), right foot, initial encounter: Secondary | ICD-10-CM | POA: Diagnosis not present

## 2016-08-17 DIAGNOSIS — S90862A Insect bite (nonvenomous), left foot, initial encounter: Secondary | ICD-10-CM | POA: Diagnosis not present

## 2016-08-19 DIAGNOSIS — H353221 Exudative age-related macular degeneration, left eye, with active choroidal neovascularization: Secondary | ICD-10-CM | POA: Diagnosis not present

## 2016-09-02 DIAGNOSIS — H04123 Dry eye syndrome of bilateral lacrimal glands: Secondary | ICD-10-CM | POA: Diagnosis not present

## 2016-09-10 DIAGNOSIS — E538 Deficiency of other specified B group vitamins: Secondary | ICD-10-CM | POA: Diagnosis not present

## 2016-09-16 DIAGNOSIS — R5383 Other fatigue: Secondary | ICD-10-CM | POA: Diagnosis not present

## 2016-09-16 DIAGNOSIS — R0602 Shortness of breath: Secondary | ICD-10-CM | POA: Diagnosis not present

## 2016-09-16 DIAGNOSIS — L03116 Cellulitis of left lower limb: Secondary | ICD-10-CM | POA: Diagnosis not present

## 2016-09-21 DIAGNOSIS — R0602 Shortness of breath: Secondary | ICD-10-CM | POA: Diagnosis not present

## 2016-09-23 DIAGNOSIS — H353221 Exudative age-related macular degeneration, left eye, with active choroidal neovascularization: Secondary | ICD-10-CM | POA: Diagnosis not present

## 2016-10-05 DIAGNOSIS — E669 Obesity, unspecified: Secondary | ICD-10-CM | POA: Insufficient documentation

## 2016-10-05 DIAGNOSIS — E785 Hyperlipidemia, unspecified: Secondary | ICD-10-CM | POA: Insufficient documentation

## 2016-10-05 DIAGNOSIS — R0609 Other forms of dyspnea: Secondary | ICD-10-CM | POA: Diagnosis not present

## 2016-10-05 DIAGNOSIS — R0602 Shortness of breath: Secondary | ICD-10-CM | POA: Diagnosis not present

## 2016-10-05 DIAGNOSIS — I10 Essential (primary) hypertension: Secondary | ICD-10-CM | POA: Diagnosis not present

## 2016-10-05 DIAGNOSIS — Z6837 Body mass index (BMI) 37.0-37.9, adult: Secondary | ICD-10-CM | POA: Diagnosis not present

## 2016-10-05 DIAGNOSIS — E6609 Other obesity due to excess calories: Secondary | ICD-10-CM | POA: Diagnosis not present

## 2016-10-07 DIAGNOSIS — R0602 Shortness of breath: Secondary | ICD-10-CM | POA: Diagnosis not present

## 2016-10-12 DIAGNOSIS — D485 Neoplasm of uncertain behavior of skin: Secondary | ICD-10-CM | POA: Diagnosis not present

## 2016-10-12 DIAGNOSIS — L821 Other seborrheic keratosis: Secondary | ICD-10-CM | POA: Diagnosis not present

## 2016-10-12 DIAGNOSIS — L570 Actinic keratosis: Secondary | ICD-10-CM | POA: Diagnosis not present

## 2016-10-19 DIAGNOSIS — E538 Deficiency of other specified B group vitamins: Secondary | ICD-10-CM | POA: Diagnosis not present

## 2016-11-04 DIAGNOSIS — H353222 Exudative age-related macular degeneration, left eye, with inactive choroidal neovascularization: Secondary | ICD-10-CM | POA: Diagnosis not present

## 2016-11-11 DIAGNOSIS — I10 Essential (primary) hypertension: Secondary | ICD-10-CM | POA: Diagnosis not present

## 2016-11-11 DIAGNOSIS — F411 Generalized anxiety disorder: Secondary | ICD-10-CM | POA: Diagnosis not present

## 2016-11-11 DIAGNOSIS — E1121 Type 2 diabetes mellitus with diabetic nephropathy: Secondary | ICD-10-CM | POA: Diagnosis not present

## 2016-11-11 DIAGNOSIS — Z23 Encounter for immunization: Secondary | ICD-10-CM | POA: Diagnosis not present

## 2016-11-11 DIAGNOSIS — E559 Vitamin D deficiency, unspecified: Secondary | ICD-10-CM | POA: Diagnosis not present

## 2016-11-11 DIAGNOSIS — E782 Mixed hyperlipidemia: Secondary | ICD-10-CM | POA: Diagnosis not present

## 2016-11-11 DIAGNOSIS — E119 Type 2 diabetes mellitus without complications: Secondary | ICD-10-CM | POA: Diagnosis not present

## 2016-11-19 DIAGNOSIS — E538 Deficiency of other specified B group vitamins: Secondary | ICD-10-CM | POA: Diagnosis not present

## 2016-12-07 DIAGNOSIS — J060 Acute laryngopharyngitis: Secondary | ICD-10-CM | POA: Diagnosis not present

## 2016-12-09 DIAGNOSIS — H353221 Exudative age-related macular degeneration, left eye, with active choroidal neovascularization: Secondary | ICD-10-CM | POA: Diagnosis not present

## 2016-12-27 DIAGNOSIS — E538 Deficiency of other specified B group vitamins: Secondary | ICD-10-CM | POA: Diagnosis not present

## 2017-01-05 DIAGNOSIS — G4733 Obstructive sleep apnea (adult) (pediatric): Secondary | ICD-10-CM | POA: Diagnosis not present

## 2017-01-05 DIAGNOSIS — J4540 Moderate persistent asthma, uncomplicated: Secondary | ICD-10-CM | POA: Diagnosis not present

## 2017-01-05 DIAGNOSIS — J301 Allergic rhinitis due to pollen: Secondary | ICD-10-CM | POA: Diagnosis not present

## 2017-01-05 DIAGNOSIS — R5383 Other fatigue: Secondary | ICD-10-CM | POA: Diagnosis not present

## 2017-01-10 DIAGNOSIS — G4733 Obstructive sleep apnea (adult) (pediatric): Secondary | ICD-10-CM | POA: Diagnosis not present

## 2017-01-13 DIAGNOSIS — H353221 Exudative age-related macular degeneration, left eye, with active choroidal neovascularization: Secondary | ICD-10-CM | POA: Diagnosis not present

## 2017-01-28 DIAGNOSIS — E538 Deficiency of other specified B group vitamins: Secondary | ICD-10-CM | POA: Diagnosis not present

## 2017-02-01 DIAGNOSIS — J301 Allergic rhinitis due to pollen: Secondary | ICD-10-CM | POA: Diagnosis not present

## 2017-02-01 DIAGNOSIS — J4540 Moderate persistent asthma, uncomplicated: Secondary | ICD-10-CM | POA: Diagnosis not present

## 2017-02-01 DIAGNOSIS — R5383 Other fatigue: Secondary | ICD-10-CM | POA: Diagnosis not present

## 2017-02-01 DIAGNOSIS — G4733 Obstructive sleep apnea (adult) (pediatric): Secondary | ICD-10-CM | POA: Diagnosis not present

## 2017-02-06 DIAGNOSIS — J209 Acute bronchitis, unspecified: Secondary | ICD-10-CM | POA: Diagnosis not present

## 2017-02-06 DIAGNOSIS — J0100 Acute maxillary sinusitis, unspecified: Secondary | ICD-10-CM | POA: Diagnosis not present

## 2017-02-09 DIAGNOSIS — G4733 Obstructive sleep apnea (adult) (pediatric): Secondary | ICD-10-CM | POA: Diagnosis not present

## 2017-02-14 DIAGNOSIS — G4733 Obstructive sleep apnea (adult) (pediatric): Secondary | ICD-10-CM | POA: Diagnosis not present

## 2017-02-14 DIAGNOSIS — J301 Allergic rhinitis due to pollen: Secondary | ICD-10-CM | POA: Diagnosis not present

## 2017-02-14 DIAGNOSIS — J4540 Moderate persistent asthma, uncomplicated: Secondary | ICD-10-CM | POA: Diagnosis not present

## 2017-02-15 DIAGNOSIS — E538 Deficiency of other specified B group vitamins: Secondary | ICD-10-CM | POA: Diagnosis not present

## 2017-02-15 DIAGNOSIS — E119 Type 2 diabetes mellitus without complications: Secondary | ICD-10-CM | POA: Diagnosis not present

## 2017-02-15 DIAGNOSIS — I10 Essential (primary) hypertension: Secondary | ICD-10-CM | POA: Diagnosis not present

## 2017-02-15 DIAGNOSIS — R5383 Other fatigue: Secondary | ICD-10-CM | POA: Diagnosis not present

## 2017-02-15 DIAGNOSIS — E782 Mixed hyperlipidemia: Secondary | ICD-10-CM | POA: Diagnosis not present

## 2017-02-15 DIAGNOSIS — E559 Vitamin D deficiency, unspecified: Secondary | ICD-10-CM | POA: Diagnosis not present

## 2017-02-15 DIAGNOSIS — F411 Generalized anxiety disorder: Secondary | ICD-10-CM | POA: Diagnosis not present

## 2017-02-19 DIAGNOSIS — J069 Acute upper respiratory infection, unspecified: Secondary | ICD-10-CM | POA: Diagnosis not present

## 2017-02-24 DIAGNOSIS — J060 Acute laryngopharyngitis: Secondary | ICD-10-CM | POA: Diagnosis not present

## 2017-02-28 DIAGNOSIS — G4733 Obstructive sleep apnea (adult) (pediatric): Secondary | ICD-10-CM | POA: Diagnosis not present

## 2017-03-02 DIAGNOSIS — F418 Other specified anxiety disorders: Secondary | ICD-10-CM | POA: Diagnosis present

## 2017-03-02 DIAGNOSIS — I10 Essential (primary) hypertension: Secondary | ICD-10-CM | POA: Diagnosis not present

## 2017-03-02 DIAGNOSIS — E860 Dehydration: Secondary | ICD-10-CM | POA: Diagnosis not present

## 2017-03-02 DIAGNOSIS — Z79899 Other long term (current) drug therapy: Secondary | ICD-10-CM | POA: Diagnosis not present

## 2017-03-02 DIAGNOSIS — K219 Gastro-esophageal reflux disease without esophagitis: Secondary | ICD-10-CM | POA: Diagnosis present

## 2017-03-02 DIAGNOSIS — I82412 Acute embolism and thrombosis of left femoral vein: Secondary | ICD-10-CM | POA: Diagnosis not present

## 2017-03-02 DIAGNOSIS — G4733 Obstructive sleep apnea (adult) (pediatric): Secondary | ICD-10-CM | POA: Diagnosis present

## 2017-03-02 DIAGNOSIS — I82402 Acute embolism and thrombosis of unspecified deep veins of left lower extremity: Secondary | ICD-10-CM | POA: Diagnosis not present

## 2017-03-02 DIAGNOSIS — E785 Hyperlipidemia, unspecified: Secondary | ICD-10-CM | POA: Diagnosis not present

## 2017-03-02 DIAGNOSIS — Z9989 Dependence on other enabling machines and devices: Secondary | ICD-10-CM | POA: Diagnosis not present

## 2017-03-02 DIAGNOSIS — J4541 Moderate persistent asthma with (acute) exacerbation: Secondary | ICD-10-CM | POA: Diagnosis not present

## 2017-03-02 DIAGNOSIS — R739 Hyperglycemia, unspecified: Secondary | ICD-10-CM | POA: Diagnosis present

## 2017-03-02 DIAGNOSIS — I2699 Other pulmonary embolism without acute cor pulmonale: Secondary | ICD-10-CM | POA: Diagnosis not present

## 2017-03-02 DIAGNOSIS — J45909 Unspecified asthma, uncomplicated: Secondary | ICD-10-CM | POA: Diagnosis present

## 2017-03-02 DIAGNOSIS — I82409 Acute embolism and thrombosis of unspecified deep veins of unspecified lower extremity: Secondary | ICD-10-CM | POA: Diagnosis not present

## 2017-03-02 DIAGNOSIS — F329 Major depressive disorder, single episode, unspecified: Secondary | ICD-10-CM | POA: Diagnosis not present

## 2017-03-03 DIAGNOSIS — I2699 Other pulmonary embolism without acute cor pulmonale: Secondary | ICD-10-CM

## 2017-03-09 DIAGNOSIS — M1991 Primary osteoarthritis, unspecified site: Secondary | ICD-10-CM | POA: Diagnosis not present

## 2017-03-09 DIAGNOSIS — E119 Type 2 diabetes mellitus without complications: Secondary | ICD-10-CM | POA: Diagnosis not present

## 2017-03-09 DIAGNOSIS — F329 Major depressive disorder, single episode, unspecified: Secondary | ICD-10-CM | POA: Diagnosis not present

## 2017-03-09 DIAGNOSIS — I82412 Acute embolism and thrombosis of left femoral vein: Secondary | ICD-10-CM | POA: Diagnosis not present

## 2017-03-09 DIAGNOSIS — I2699 Other pulmonary embolism without acute cor pulmonale: Secondary | ICD-10-CM | POA: Diagnosis not present

## 2017-03-09 DIAGNOSIS — G4733 Obstructive sleep apnea (adult) (pediatric): Secondary | ICD-10-CM | POA: Diagnosis not present

## 2017-03-09 DIAGNOSIS — I10 Essential (primary) hypertension: Secondary | ICD-10-CM | POA: Diagnosis not present

## 2017-03-09 DIAGNOSIS — J45909 Unspecified asthma, uncomplicated: Secondary | ICD-10-CM | POA: Diagnosis not present

## 2017-03-09 DIAGNOSIS — Z7984 Long term (current) use of oral hypoglycemic drugs: Secondary | ICD-10-CM | POA: Diagnosis not present

## 2017-03-09 DIAGNOSIS — F419 Anxiety disorder, unspecified: Secondary | ICD-10-CM | POA: Diagnosis not present

## 2017-03-09 DIAGNOSIS — Z7901 Long term (current) use of anticoagulants: Secondary | ICD-10-CM | POA: Diagnosis not present

## 2017-03-10 DIAGNOSIS — I2699 Other pulmonary embolism without acute cor pulmonale: Secondary | ICD-10-CM | POA: Diagnosis not present

## 2017-03-10 DIAGNOSIS — I10 Essential (primary) hypertension: Secondary | ICD-10-CM | POA: Diagnosis not present

## 2017-03-10 DIAGNOSIS — I82403 Acute embolism and thrombosis of unspecified deep veins of lower extremity, bilateral: Secondary | ICD-10-CM | POA: Diagnosis not present

## 2017-03-10 DIAGNOSIS — F329 Major depressive disorder, single episode, unspecified: Secondary | ICD-10-CM | POA: Diagnosis not present

## 2017-03-10 DIAGNOSIS — J45909 Unspecified asthma, uncomplicated: Secondary | ICD-10-CM | POA: Diagnosis not present

## 2017-03-10 DIAGNOSIS — E119 Type 2 diabetes mellitus without complications: Secondary | ICD-10-CM | POA: Diagnosis not present

## 2017-03-10 DIAGNOSIS — I2692 Saddle embolus of pulmonary artery without acute cor pulmonale: Secondary | ICD-10-CM | POA: Diagnosis not present

## 2017-03-10 DIAGNOSIS — I82412 Acute embolism and thrombosis of left femoral vein: Secondary | ICD-10-CM | POA: Diagnosis not present

## 2017-03-12 DIAGNOSIS — J45909 Unspecified asthma, uncomplicated: Secondary | ICD-10-CM | POA: Diagnosis not present

## 2017-03-12 DIAGNOSIS — I82412 Acute embolism and thrombosis of left femoral vein: Secondary | ICD-10-CM | POA: Diagnosis not present

## 2017-03-12 DIAGNOSIS — I10 Essential (primary) hypertension: Secondary | ICD-10-CM | POA: Diagnosis not present

## 2017-03-12 DIAGNOSIS — E119 Type 2 diabetes mellitus without complications: Secondary | ICD-10-CM | POA: Diagnosis not present

## 2017-03-12 DIAGNOSIS — F329 Major depressive disorder, single episode, unspecified: Secondary | ICD-10-CM | POA: Diagnosis not present

## 2017-03-12 DIAGNOSIS — I2699 Other pulmonary embolism without acute cor pulmonale: Secondary | ICD-10-CM | POA: Diagnosis not present

## 2017-03-14 DIAGNOSIS — I2699 Other pulmonary embolism without acute cor pulmonale: Secondary | ICD-10-CM | POA: Diagnosis not present

## 2017-03-14 DIAGNOSIS — F329 Major depressive disorder, single episode, unspecified: Secondary | ICD-10-CM | POA: Diagnosis not present

## 2017-03-14 DIAGNOSIS — I10 Essential (primary) hypertension: Secondary | ICD-10-CM | POA: Diagnosis not present

## 2017-03-14 DIAGNOSIS — J45909 Unspecified asthma, uncomplicated: Secondary | ICD-10-CM | POA: Diagnosis not present

## 2017-03-14 DIAGNOSIS — E119 Type 2 diabetes mellitus without complications: Secondary | ICD-10-CM | POA: Diagnosis not present

## 2017-03-14 DIAGNOSIS — I82412 Acute embolism and thrombosis of left femoral vein: Secondary | ICD-10-CM | POA: Diagnosis not present

## 2017-03-15 DIAGNOSIS — I82409 Acute embolism and thrombosis of unspecified deep veins of unspecified lower extremity: Secondary | ICD-10-CM | POA: Diagnosis not present

## 2017-03-15 DIAGNOSIS — I2699 Other pulmonary embolism without acute cor pulmonale: Secondary | ICD-10-CM | POA: Diagnosis not present

## 2017-03-15 DIAGNOSIS — I10 Essential (primary) hypertension: Secondary | ICD-10-CM | POA: Diagnosis not present

## 2017-03-15 DIAGNOSIS — I82412 Acute embolism and thrombosis of left femoral vein: Secondary | ICD-10-CM | POA: Diagnosis not present

## 2017-03-15 DIAGNOSIS — E119 Type 2 diabetes mellitus without complications: Secondary | ICD-10-CM | POA: Diagnosis not present

## 2017-03-15 DIAGNOSIS — I2692 Saddle embolus of pulmonary artery without acute cor pulmonale: Secondary | ICD-10-CM | POA: Diagnosis not present

## 2017-03-15 DIAGNOSIS — J45909 Unspecified asthma, uncomplicated: Secondary | ICD-10-CM | POA: Diagnosis not present

## 2017-03-15 DIAGNOSIS — F329 Major depressive disorder, single episode, unspecified: Secondary | ICD-10-CM | POA: Diagnosis not present

## 2017-03-15 DIAGNOSIS — Z87440 Personal history of urinary (tract) infections: Secondary | ICD-10-CM | POA: Diagnosis not present

## 2017-03-16 DIAGNOSIS — I82412 Acute embolism and thrombosis of left femoral vein: Secondary | ICD-10-CM | POA: Diagnosis not present

## 2017-03-16 DIAGNOSIS — F329 Major depressive disorder, single episode, unspecified: Secondary | ICD-10-CM | POA: Diagnosis not present

## 2017-03-16 DIAGNOSIS — E119 Type 2 diabetes mellitus without complications: Secondary | ICD-10-CM | POA: Diagnosis not present

## 2017-03-16 DIAGNOSIS — J45909 Unspecified asthma, uncomplicated: Secondary | ICD-10-CM | POA: Diagnosis not present

## 2017-03-16 DIAGNOSIS — I2699 Other pulmonary embolism without acute cor pulmonale: Secondary | ICD-10-CM | POA: Diagnosis not present

## 2017-03-16 DIAGNOSIS — I10 Essential (primary) hypertension: Secondary | ICD-10-CM | POA: Diagnosis not present

## 2017-03-17 DIAGNOSIS — I2692 Saddle embolus of pulmonary artery without acute cor pulmonale: Secondary | ICD-10-CM | POA: Diagnosis not present

## 2017-03-18 DIAGNOSIS — I82412 Acute embolism and thrombosis of left femoral vein: Secondary | ICD-10-CM | POA: Diagnosis not present

## 2017-03-18 DIAGNOSIS — J45909 Unspecified asthma, uncomplicated: Secondary | ICD-10-CM | POA: Diagnosis not present

## 2017-03-18 DIAGNOSIS — E119 Type 2 diabetes mellitus without complications: Secondary | ICD-10-CM | POA: Diagnosis not present

## 2017-03-18 DIAGNOSIS — F329 Major depressive disorder, single episode, unspecified: Secondary | ICD-10-CM | POA: Diagnosis not present

## 2017-03-18 DIAGNOSIS — I10 Essential (primary) hypertension: Secondary | ICD-10-CM | POA: Diagnosis not present

## 2017-03-18 DIAGNOSIS — I2699 Other pulmonary embolism without acute cor pulmonale: Secondary | ICD-10-CM | POA: Diagnosis not present

## 2017-03-21 DIAGNOSIS — J45909 Unspecified asthma, uncomplicated: Secondary | ICD-10-CM | POA: Diagnosis not present

## 2017-03-21 DIAGNOSIS — I82412 Acute embolism and thrombosis of left femoral vein: Secondary | ICD-10-CM | POA: Diagnosis not present

## 2017-03-21 DIAGNOSIS — I10 Essential (primary) hypertension: Secondary | ICD-10-CM | POA: Diagnosis not present

## 2017-03-21 DIAGNOSIS — I2699 Other pulmonary embolism without acute cor pulmonale: Secondary | ICD-10-CM | POA: Diagnosis not present

## 2017-03-21 DIAGNOSIS — E119 Type 2 diabetes mellitus without complications: Secondary | ICD-10-CM | POA: Diagnosis not present

## 2017-03-21 DIAGNOSIS — F329 Major depressive disorder, single episode, unspecified: Secondary | ICD-10-CM | POA: Diagnosis not present

## 2017-03-22 DIAGNOSIS — I10 Essential (primary) hypertension: Secondary | ICD-10-CM | POA: Diagnosis not present

## 2017-03-22 DIAGNOSIS — Z7901 Long term (current) use of anticoagulants: Secondary | ICD-10-CM | POA: Diagnosis not present

## 2017-03-22 DIAGNOSIS — F329 Major depressive disorder, single episode, unspecified: Secondary | ICD-10-CM | POA: Diagnosis not present

## 2017-03-22 DIAGNOSIS — E7800 Pure hypercholesterolemia, unspecified: Secondary | ICD-10-CM | POA: Diagnosis not present

## 2017-03-22 DIAGNOSIS — I82409 Acute embolism and thrombosis of unspecified deep veins of unspecified lower extremity: Secondary | ICD-10-CM | POA: Diagnosis not present

## 2017-03-22 DIAGNOSIS — I82412 Acute embolism and thrombosis of left femoral vein: Secondary | ICD-10-CM | POA: Diagnosis not present

## 2017-03-22 DIAGNOSIS — I2699 Other pulmonary embolism without acute cor pulmonale: Secondary | ICD-10-CM | POA: Diagnosis not present

## 2017-03-22 DIAGNOSIS — G4733 Obstructive sleep apnea (adult) (pediatric): Secondary | ICD-10-CM | POA: Diagnosis not present

## 2017-03-22 DIAGNOSIS — I82492 Acute embolism and thrombosis of other specified deep vein of left lower extremity: Secondary | ICD-10-CM | POA: Diagnosis not present

## 2017-03-22 DIAGNOSIS — E119 Type 2 diabetes mellitus without complications: Secondary | ICD-10-CM | POA: Diagnosis not present

## 2017-03-22 DIAGNOSIS — K219 Gastro-esophageal reflux disease without esophagitis: Secondary | ICD-10-CM | POA: Diagnosis not present

## 2017-03-22 DIAGNOSIS — Z79899 Other long term (current) drug therapy: Secondary | ICD-10-CM | POA: Diagnosis not present

## 2017-03-22 DIAGNOSIS — J45909 Unspecified asthma, uncomplicated: Secondary | ICD-10-CM | POA: Diagnosis not present

## 2017-03-23 DIAGNOSIS — I10 Essential (primary) hypertension: Secondary | ICD-10-CM | POA: Diagnosis not present

## 2017-03-23 DIAGNOSIS — E119 Type 2 diabetes mellitus without complications: Secondary | ICD-10-CM | POA: Diagnosis not present

## 2017-03-23 DIAGNOSIS — I2699 Other pulmonary embolism without acute cor pulmonale: Secondary | ICD-10-CM | POA: Diagnosis not present

## 2017-03-23 DIAGNOSIS — I82412 Acute embolism and thrombosis of left femoral vein: Secondary | ICD-10-CM | POA: Diagnosis not present

## 2017-03-23 DIAGNOSIS — J45909 Unspecified asthma, uncomplicated: Secondary | ICD-10-CM | POA: Diagnosis not present

## 2017-03-23 DIAGNOSIS — F329 Major depressive disorder, single episode, unspecified: Secondary | ICD-10-CM | POA: Diagnosis not present

## 2017-03-24 DIAGNOSIS — F329 Major depressive disorder, single episode, unspecified: Secondary | ICD-10-CM | POA: Diagnosis not present

## 2017-03-24 DIAGNOSIS — J45909 Unspecified asthma, uncomplicated: Secondary | ICD-10-CM | POA: Diagnosis not present

## 2017-03-24 DIAGNOSIS — I2699 Other pulmonary embolism without acute cor pulmonale: Secondary | ICD-10-CM | POA: Diagnosis not present

## 2017-03-24 DIAGNOSIS — I82412 Acute embolism and thrombosis of left femoral vein: Secondary | ICD-10-CM | POA: Diagnosis not present

## 2017-03-24 DIAGNOSIS — I10 Essential (primary) hypertension: Secondary | ICD-10-CM | POA: Diagnosis not present

## 2017-03-24 DIAGNOSIS — E119 Type 2 diabetes mellitus without complications: Secondary | ICD-10-CM | POA: Diagnosis not present

## 2017-03-28 DIAGNOSIS — I10 Essential (primary) hypertension: Secondary | ICD-10-CM | POA: Diagnosis not present

## 2017-03-28 DIAGNOSIS — I82412 Acute embolism and thrombosis of left femoral vein: Secondary | ICD-10-CM | POA: Diagnosis not present

## 2017-03-28 DIAGNOSIS — E119 Type 2 diabetes mellitus without complications: Secondary | ICD-10-CM | POA: Diagnosis not present

## 2017-03-28 DIAGNOSIS — J45909 Unspecified asthma, uncomplicated: Secondary | ICD-10-CM | POA: Diagnosis not present

## 2017-03-28 DIAGNOSIS — I2699 Other pulmonary embolism without acute cor pulmonale: Secondary | ICD-10-CM | POA: Diagnosis not present

## 2017-03-28 DIAGNOSIS — F329 Major depressive disorder, single episode, unspecified: Secondary | ICD-10-CM | POA: Diagnosis not present

## 2017-03-29 DIAGNOSIS — I2699 Other pulmonary embolism without acute cor pulmonale: Secondary | ICD-10-CM | POA: Diagnosis not present

## 2017-03-29 DIAGNOSIS — I10 Essential (primary) hypertension: Secondary | ICD-10-CM | POA: Diagnosis not present

## 2017-03-29 DIAGNOSIS — E119 Type 2 diabetes mellitus without complications: Secondary | ICD-10-CM | POA: Diagnosis not present

## 2017-03-29 DIAGNOSIS — I82412 Acute embolism and thrombosis of left femoral vein: Secondary | ICD-10-CM | POA: Diagnosis not present

## 2017-03-29 DIAGNOSIS — F329 Major depressive disorder, single episode, unspecified: Secondary | ICD-10-CM | POA: Diagnosis not present

## 2017-03-29 DIAGNOSIS — J45909 Unspecified asthma, uncomplicated: Secondary | ICD-10-CM | POA: Diagnosis not present

## 2017-03-31 DIAGNOSIS — E119 Type 2 diabetes mellitus without complications: Secondary | ICD-10-CM | POA: Diagnosis not present

## 2017-03-31 DIAGNOSIS — I2699 Other pulmonary embolism without acute cor pulmonale: Secondary | ICD-10-CM | POA: Diagnosis not present

## 2017-03-31 DIAGNOSIS — I10 Essential (primary) hypertension: Secondary | ICD-10-CM | POA: Diagnosis not present

## 2017-03-31 DIAGNOSIS — I82412 Acute embolism and thrombosis of left femoral vein: Secondary | ICD-10-CM | POA: Diagnosis not present

## 2017-03-31 DIAGNOSIS — J45909 Unspecified asthma, uncomplicated: Secondary | ICD-10-CM | POA: Diagnosis not present

## 2017-03-31 DIAGNOSIS — F329 Major depressive disorder, single episode, unspecified: Secondary | ICD-10-CM | POA: Diagnosis not present

## 2017-04-04 DIAGNOSIS — J45909 Unspecified asthma, uncomplicated: Secondary | ICD-10-CM | POA: Diagnosis not present

## 2017-04-04 DIAGNOSIS — I2699 Other pulmonary embolism without acute cor pulmonale: Secondary | ICD-10-CM | POA: Diagnosis not present

## 2017-04-04 DIAGNOSIS — E119 Type 2 diabetes mellitus without complications: Secondary | ICD-10-CM | POA: Diagnosis not present

## 2017-04-04 DIAGNOSIS — I82412 Acute embolism and thrombosis of left femoral vein: Secondary | ICD-10-CM | POA: Diagnosis not present

## 2017-04-04 DIAGNOSIS — I10 Essential (primary) hypertension: Secondary | ICD-10-CM | POA: Diagnosis not present

## 2017-04-04 DIAGNOSIS — F329 Major depressive disorder, single episode, unspecified: Secondary | ICD-10-CM | POA: Diagnosis not present

## 2017-04-06 DIAGNOSIS — G4733 Obstructive sleep apnea (adult) (pediatric): Secondary | ICD-10-CM | POA: Diagnosis not present

## 2017-04-06 DIAGNOSIS — J4540 Moderate persistent asthma, uncomplicated: Secondary | ICD-10-CM | POA: Diagnosis not present

## 2017-04-08 DIAGNOSIS — I82403 Acute embolism and thrombosis of unspecified deep veins of lower extremity, bilateral: Secondary | ICD-10-CM | POA: Diagnosis not present

## 2017-04-08 DIAGNOSIS — I2692 Saddle embolus of pulmonary artery without acute cor pulmonale: Secondary | ICD-10-CM | POA: Diagnosis not present

## 2017-04-08 DIAGNOSIS — B373 Candidiasis of vulva and vagina: Secondary | ICD-10-CM | POA: Diagnosis not present

## 2017-04-12 DIAGNOSIS — I82412 Acute embolism and thrombosis of left femoral vein: Secondary | ICD-10-CM | POA: Diagnosis not present

## 2017-04-12 DIAGNOSIS — I10 Essential (primary) hypertension: Secondary | ICD-10-CM | POA: Diagnosis not present

## 2017-04-12 DIAGNOSIS — I2699 Other pulmonary embolism without acute cor pulmonale: Secondary | ICD-10-CM | POA: Diagnosis not present

## 2017-04-12 DIAGNOSIS — F329 Major depressive disorder, single episode, unspecified: Secondary | ICD-10-CM | POA: Diagnosis not present

## 2017-04-12 DIAGNOSIS — J45909 Unspecified asthma, uncomplicated: Secondary | ICD-10-CM | POA: Diagnosis not present

## 2017-04-12 DIAGNOSIS — E119 Type 2 diabetes mellitus without complications: Secondary | ICD-10-CM | POA: Diagnosis not present

## 2017-04-13 DIAGNOSIS — I10 Essential (primary) hypertension: Secondary | ICD-10-CM | POA: Diagnosis not present

## 2017-04-13 DIAGNOSIS — I2699 Other pulmonary embolism without acute cor pulmonale: Secondary | ICD-10-CM | POA: Diagnosis not present

## 2017-04-13 DIAGNOSIS — J45909 Unspecified asthma, uncomplicated: Secondary | ICD-10-CM | POA: Diagnosis not present

## 2017-04-13 DIAGNOSIS — I82412 Acute embolism and thrombosis of left femoral vein: Secondary | ICD-10-CM | POA: Diagnosis not present

## 2017-04-13 DIAGNOSIS — F329 Major depressive disorder, single episode, unspecified: Secondary | ICD-10-CM | POA: Diagnosis not present

## 2017-04-13 DIAGNOSIS — E119 Type 2 diabetes mellitus without complications: Secondary | ICD-10-CM | POA: Diagnosis not present

## 2017-04-18 DIAGNOSIS — J45909 Unspecified asthma, uncomplicated: Secondary | ICD-10-CM | POA: Diagnosis not present

## 2017-04-18 DIAGNOSIS — I2699 Other pulmonary embolism without acute cor pulmonale: Secondary | ICD-10-CM | POA: Diagnosis not present

## 2017-04-18 DIAGNOSIS — I10 Essential (primary) hypertension: Secondary | ICD-10-CM | POA: Diagnosis not present

## 2017-04-18 DIAGNOSIS — I82412 Acute embolism and thrombosis of left femoral vein: Secondary | ICD-10-CM | POA: Diagnosis not present

## 2017-04-18 DIAGNOSIS — F329 Major depressive disorder, single episode, unspecified: Secondary | ICD-10-CM | POA: Diagnosis not present

## 2017-04-18 DIAGNOSIS — E119 Type 2 diabetes mellitus without complications: Secondary | ICD-10-CM | POA: Diagnosis not present

## 2017-04-25 DIAGNOSIS — I2692 Saddle embolus of pulmonary artery without acute cor pulmonale: Secondary | ICD-10-CM | POA: Diagnosis not present

## 2017-04-25 DIAGNOSIS — I82403 Acute embolism and thrombosis of unspecified deep veins of lower extremity, bilateral: Secondary | ICD-10-CM | POA: Diagnosis not present

## 2017-04-27 DIAGNOSIS — I10 Essential (primary) hypertension: Secondary | ICD-10-CM | POA: Diagnosis not present

## 2017-04-27 DIAGNOSIS — I82412 Acute embolism and thrombosis of left femoral vein: Secondary | ICD-10-CM | POA: Diagnosis not present

## 2017-04-27 DIAGNOSIS — J45909 Unspecified asthma, uncomplicated: Secondary | ICD-10-CM | POA: Diagnosis not present

## 2017-04-27 DIAGNOSIS — F329 Major depressive disorder, single episode, unspecified: Secondary | ICD-10-CM | POA: Diagnosis not present

## 2017-04-27 DIAGNOSIS — E119 Type 2 diabetes mellitus without complications: Secondary | ICD-10-CM | POA: Diagnosis not present

## 2017-04-27 DIAGNOSIS — I2699 Other pulmonary embolism without acute cor pulmonale: Secondary | ICD-10-CM | POA: Diagnosis not present

## 2017-04-29 DIAGNOSIS — G4733 Obstructive sleep apnea (adult) (pediatric): Secondary | ICD-10-CM | POA: Diagnosis not present

## 2017-04-29 DIAGNOSIS — L03039 Cellulitis of unspecified toe: Secondary | ICD-10-CM | POA: Diagnosis not present

## 2017-05-02 DIAGNOSIS — M79671 Pain in right foot: Secondary | ICD-10-CM | POA: Diagnosis not present

## 2017-05-04 DIAGNOSIS — J4540 Moderate persistent asthma, uncomplicated: Secondary | ICD-10-CM | POA: Diagnosis not present

## 2017-05-04 DIAGNOSIS — R5383 Other fatigue: Secondary | ICD-10-CM | POA: Diagnosis not present

## 2017-05-04 DIAGNOSIS — J301 Allergic rhinitis due to pollen: Secondary | ICD-10-CM | POA: Diagnosis not present

## 2017-05-04 DIAGNOSIS — G4733 Obstructive sleep apnea (adult) (pediatric): Secondary | ICD-10-CM | POA: Diagnosis not present

## 2017-05-05 DIAGNOSIS — E119 Type 2 diabetes mellitus without complications: Secondary | ICD-10-CM | POA: Diagnosis not present

## 2017-05-05 DIAGNOSIS — J45909 Unspecified asthma, uncomplicated: Secondary | ICD-10-CM | POA: Diagnosis not present

## 2017-05-05 DIAGNOSIS — I82412 Acute embolism and thrombosis of left femoral vein: Secondary | ICD-10-CM | POA: Diagnosis not present

## 2017-05-05 DIAGNOSIS — I2699 Other pulmonary embolism without acute cor pulmonale: Secondary | ICD-10-CM | POA: Diagnosis not present

## 2017-05-05 DIAGNOSIS — F329 Major depressive disorder, single episode, unspecified: Secondary | ICD-10-CM | POA: Diagnosis not present

## 2017-05-05 DIAGNOSIS — I10 Essential (primary) hypertension: Secondary | ICD-10-CM | POA: Diagnosis not present

## 2017-05-09 DIAGNOSIS — G4733 Obstructive sleep apnea (adult) (pediatric): Secondary | ICD-10-CM | POA: Diagnosis not present

## 2017-05-12 DIAGNOSIS — E538 Deficiency of other specified B group vitamins: Secondary | ICD-10-CM | POA: Diagnosis not present

## 2017-05-16 ENCOUNTER — Emergency Department (HOSPITAL_COMMUNITY)
Admission: EM | Admit: 2017-05-16 | Discharge: 2017-05-16 | Discharge status: Against Medical Advice | Payer: Medicare Other

## 2017-05-16 DIAGNOSIS — E782 Mixed hyperlipidemia: Secondary | ICD-10-CM | POA: Diagnosis not present

## 2017-05-16 DIAGNOSIS — M7989 Other specified soft tissue disorders: Secondary | ICD-10-CM | POA: Diagnosis not present

## 2017-05-16 DIAGNOSIS — E1169 Type 2 diabetes mellitus with other specified complication: Secondary | ICD-10-CM | POA: Diagnosis not present

## 2017-05-16 DIAGNOSIS — E559 Vitamin D deficiency, unspecified: Secondary | ICD-10-CM | POA: Diagnosis not present

## 2017-05-16 DIAGNOSIS — F411 Generalized anxiety disorder: Secondary | ICD-10-CM | POA: Diagnosis not present

## 2017-05-16 DIAGNOSIS — I2692 Saddle embolus of pulmonary artery without acute cor pulmonale: Secondary | ICD-10-CM | POA: Diagnosis not present

## 2017-05-16 DIAGNOSIS — R2242 Localized swelling, mass and lump, left lower limb: Secondary | ICD-10-CM | POA: Diagnosis not present

## 2017-05-16 DIAGNOSIS — R0602 Shortness of breath: Secondary | ICD-10-CM | POA: Diagnosis not present

## 2017-05-16 DIAGNOSIS — I10 Essential (primary) hypertension: Secondary | ICD-10-CM | POA: Diagnosis not present

## 2017-05-16 DIAGNOSIS — R799 Abnormal finding of blood chemistry, unspecified: Secondary | ICD-10-CM | POA: Diagnosis not present

## 2017-05-16 DIAGNOSIS — L03116 Cellulitis of left lower limb: Secondary | ICD-10-CM | POA: Diagnosis not present

## 2017-05-16 NOTE — ED Notes (Signed)
Pt states that she is leaving due to wait times  

## 2017-05-17 DIAGNOSIS — R0602 Shortness of breath: Secondary | ICD-10-CM | POA: Diagnosis not present

## 2017-05-17 DIAGNOSIS — R2242 Localized swelling, mass and lump, left lower limb: Secondary | ICD-10-CM | POA: Diagnosis not present

## 2017-05-24 DIAGNOSIS — L03116 Cellulitis of left lower limb: Secondary | ICD-10-CM | POA: Diagnosis not present

## 2017-06-01 DIAGNOSIS — I82403 Acute embolism and thrombosis of unspecified deep veins of lower extremity, bilateral: Secondary | ICD-10-CM | POA: Diagnosis not present

## 2017-06-01 DIAGNOSIS — R600 Localized edema: Secondary | ICD-10-CM | POA: Diagnosis not present

## 2017-06-03 DIAGNOSIS — R5383 Other fatigue: Secondary | ICD-10-CM | POA: Diagnosis not present

## 2017-06-03 DIAGNOSIS — G4733 Obstructive sleep apnea (adult) (pediatric): Secondary | ICD-10-CM | POA: Diagnosis not present

## 2017-06-03 DIAGNOSIS — J301 Allergic rhinitis due to pollen: Secondary | ICD-10-CM | POA: Diagnosis not present

## 2017-06-03 DIAGNOSIS — J4540 Moderate persistent asthma, uncomplicated: Secondary | ICD-10-CM | POA: Diagnosis not present

## 2017-06-22 DIAGNOSIS — E538 Deficiency of other specified B group vitamins: Secondary | ICD-10-CM | POA: Diagnosis not present

## 2017-07-11 DIAGNOSIS — G4733 Obstructive sleep apnea (adult) (pediatric): Secondary | ICD-10-CM | POA: Diagnosis not present

## 2017-07-12 DIAGNOSIS — R5383 Other fatigue: Secondary | ICD-10-CM | POA: Diagnosis not present

## 2017-07-12 DIAGNOSIS — J4540 Moderate persistent asthma, uncomplicated: Secondary | ICD-10-CM | POA: Diagnosis not present

## 2017-07-12 DIAGNOSIS — J301 Allergic rhinitis due to pollen: Secondary | ICD-10-CM | POA: Diagnosis not present

## 2017-07-12 DIAGNOSIS — G4733 Obstructive sleep apnea (adult) (pediatric): Secondary | ICD-10-CM | POA: Diagnosis not present

## 2017-07-19 DIAGNOSIS — E538 Deficiency of other specified B group vitamins: Secondary | ICD-10-CM | POA: Diagnosis not present

## 2017-07-20 DIAGNOSIS — R5383 Other fatigue: Secondary | ICD-10-CM | POA: Diagnosis not present

## 2017-07-20 DIAGNOSIS — G4733 Obstructive sleep apnea (adult) (pediatric): Secondary | ICD-10-CM | POA: Diagnosis not present

## 2017-07-20 DIAGNOSIS — J4540 Moderate persistent asthma, uncomplicated: Secondary | ICD-10-CM | POA: Diagnosis not present

## 2017-07-20 DIAGNOSIS — J301 Allergic rhinitis due to pollen: Secondary | ICD-10-CM | POA: Diagnosis not present

## 2017-07-21 DIAGNOSIS — S90862A Insect bite (nonvenomous), left foot, initial encounter: Secondary | ICD-10-CM | POA: Diagnosis not present

## 2017-07-25 DIAGNOSIS — E669 Obesity, unspecified: Secondary | ICD-10-CM | POA: Diagnosis not present

## 2017-07-25 DIAGNOSIS — Z6835 Body mass index (BMI) 35.0-35.9, adult: Secondary | ICD-10-CM | POA: Diagnosis not present

## 2017-07-25 DIAGNOSIS — Z1339 Encounter for screening examination for other mental health and behavioral disorders: Secondary | ICD-10-CM | POA: Diagnosis not present

## 2017-07-25 DIAGNOSIS — Z86718 Personal history of other venous thrombosis and embolism: Secondary | ICD-10-CM | POA: Diagnosis not present

## 2017-07-25 DIAGNOSIS — I10 Essential (primary) hypertension: Secondary | ICD-10-CM | POA: Diagnosis not present

## 2017-08-02 DIAGNOSIS — Z79899 Other long term (current) drug therapy: Secondary | ICD-10-CM | POA: Diagnosis not present

## 2017-08-02 DIAGNOSIS — E559 Vitamin D deficiency, unspecified: Secondary | ICD-10-CM | POA: Diagnosis not present

## 2017-08-02 DIAGNOSIS — E785 Hyperlipidemia, unspecified: Secondary | ICD-10-CM | POA: Diagnosis not present

## 2017-08-03 DIAGNOSIS — H353221 Exudative age-related macular degeneration, left eye, with active choroidal neovascularization: Secondary | ICD-10-CM | POA: Diagnosis not present

## 2017-08-04 DIAGNOSIS — Z1212 Encounter for screening for malignant neoplasm of rectum: Secondary | ICD-10-CM | POA: Diagnosis not present

## 2017-08-04 DIAGNOSIS — Z1211 Encounter for screening for malignant neoplasm of colon: Secondary | ICD-10-CM | POA: Diagnosis not present

## 2017-08-17 DIAGNOSIS — I10 Essential (primary) hypertension: Secondary | ICD-10-CM | POA: Diagnosis not present

## 2017-08-17 DIAGNOSIS — L03039 Cellulitis of unspecified toe: Secondary | ICD-10-CM | POA: Diagnosis not present

## 2017-08-17 DIAGNOSIS — Z6838 Body mass index (BMI) 38.0-38.9, adult: Secondary | ICD-10-CM | POA: Diagnosis not present

## 2017-08-17 DIAGNOSIS — E669 Obesity, unspecified: Secondary | ICD-10-CM | POA: Diagnosis not present

## 2017-08-17 DIAGNOSIS — E1121 Type 2 diabetes mellitus with diabetic nephropathy: Secondary | ICD-10-CM | POA: Diagnosis not present

## 2017-08-17 DIAGNOSIS — E538 Deficiency of other specified B group vitamins: Secondary | ICD-10-CM | POA: Diagnosis not present

## 2017-08-24 DIAGNOSIS — R1011 Right upper quadrant pain: Secondary | ICD-10-CM | POA: Diagnosis not present

## 2017-08-24 DIAGNOSIS — K8010 Calculus of gallbladder with chronic cholecystitis without obstruction: Secondary | ICD-10-CM | POA: Diagnosis not present

## 2017-08-24 DIAGNOSIS — Z86718 Personal history of other venous thrombosis and embolism: Secondary | ICD-10-CM | POA: Diagnosis not present

## 2017-08-24 DIAGNOSIS — E119 Type 2 diabetes mellitus without complications: Secondary | ICD-10-CM | POA: Diagnosis not present

## 2017-08-29 DIAGNOSIS — K8000 Calculus of gallbladder with acute cholecystitis without obstruction: Secondary | ICD-10-CM | POA: Diagnosis not present

## 2017-08-29 DIAGNOSIS — Z7901 Long term (current) use of anticoagulants: Secondary | ICD-10-CM | POA: Diagnosis not present

## 2017-08-29 DIAGNOSIS — E119 Type 2 diabetes mellitus without complications: Secondary | ICD-10-CM | POA: Diagnosis not present

## 2017-08-29 DIAGNOSIS — K8020 Calculus of gallbladder without cholecystitis without obstruction: Secondary | ICD-10-CM | POA: Diagnosis not present

## 2017-08-29 DIAGNOSIS — Z86711 Personal history of pulmonary embolism: Secondary | ICD-10-CM | POA: Diagnosis not present

## 2017-08-29 DIAGNOSIS — Z86718 Personal history of other venous thrombosis and embolism: Secondary | ICD-10-CM | POA: Diagnosis not present

## 2017-08-29 DIAGNOSIS — K8010 Calculus of gallbladder with chronic cholecystitis without obstruction: Secondary | ICD-10-CM | POA: Diagnosis not present

## 2017-08-30 DIAGNOSIS — K8010 Calculus of gallbladder with chronic cholecystitis without obstruction: Secondary | ICD-10-CM | POA: Diagnosis not present

## 2017-08-30 DIAGNOSIS — Z86711 Personal history of pulmonary embolism: Secondary | ICD-10-CM | POA: Diagnosis not present

## 2017-08-30 DIAGNOSIS — E119 Type 2 diabetes mellitus without complications: Secondary | ICD-10-CM | POA: Diagnosis not present

## 2017-08-30 DIAGNOSIS — Z7901 Long term (current) use of anticoagulants: Secondary | ICD-10-CM | POA: Diagnosis not present

## 2017-08-30 DIAGNOSIS — Z86718 Personal history of other venous thrombosis and embolism: Secondary | ICD-10-CM | POA: Diagnosis not present

## 2017-09-14 DIAGNOSIS — H353222 Exudative age-related macular degeneration, left eye, with inactive choroidal neovascularization: Secondary | ICD-10-CM | POA: Diagnosis not present

## 2017-10-03 DIAGNOSIS — F419 Anxiety disorder, unspecified: Secondary | ICD-10-CM | POA: Diagnosis not present

## 2017-10-03 DIAGNOSIS — E669 Obesity, unspecified: Secondary | ICD-10-CM | POA: Diagnosis not present

## 2017-10-03 DIAGNOSIS — Z6838 Body mass index (BMI) 38.0-38.9, adult: Secondary | ICD-10-CM | POA: Diagnosis not present

## 2017-10-03 DIAGNOSIS — J309 Allergic rhinitis, unspecified: Secondary | ICD-10-CM | POA: Diagnosis not present

## 2017-10-11 DIAGNOSIS — E538 Deficiency of other specified B group vitamins: Secondary | ICD-10-CM | POA: Diagnosis not present

## 2017-10-13 DIAGNOSIS — H353221 Exudative age-related macular degeneration, left eye, with active choroidal neovascularization: Secondary | ICD-10-CM | POA: Diagnosis not present

## 2017-11-15 DIAGNOSIS — E538 Deficiency of other specified B group vitamins: Secondary | ICD-10-CM | POA: Diagnosis not present

## 2017-11-24 DIAGNOSIS — Z6838 Body mass index (BMI) 38.0-38.9, adult: Secondary | ICD-10-CM | POA: Diagnosis not present

## 2017-11-24 DIAGNOSIS — J019 Acute sinusitis, unspecified: Secondary | ICD-10-CM | POA: Diagnosis not present

## 2017-11-24 DIAGNOSIS — J309 Allergic rhinitis, unspecified: Secondary | ICD-10-CM | POA: Diagnosis not present

## 2017-11-24 DIAGNOSIS — J029 Acute pharyngitis, unspecified: Secondary | ICD-10-CM | POA: Diagnosis not present

## 2017-11-28 DIAGNOSIS — H353221 Exudative age-related macular degeneration, left eye, with active choroidal neovascularization: Secondary | ICD-10-CM | POA: Diagnosis not present

## 2017-12-13 DIAGNOSIS — I10 Essential (primary) hypertension: Secondary | ICD-10-CM | POA: Diagnosis not present

## 2017-12-13 DIAGNOSIS — E785 Hyperlipidemia, unspecified: Secondary | ICD-10-CM | POA: Diagnosis not present

## 2017-12-13 DIAGNOSIS — E1121 Type 2 diabetes mellitus with diabetic nephropathy: Secondary | ICD-10-CM | POA: Diagnosis not present

## 2017-12-13 DIAGNOSIS — J029 Acute pharyngitis, unspecified: Secondary | ICD-10-CM | POA: Diagnosis not present

## 2017-12-13 DIAGNOSIS — Z6838 Body mass index (BMI) 38.0-38.9, adult: Secondary | ICD-10-CM | POA: Diagnosis not present

## 2017-12-13 DIAGNOSIS — Z79899 Other long term (current) drug therapy: Secondary | ICD-10-CM | POA: Diagnosis not present

## 2017-12-19 DIAGNOSIS — J309 Allergic rhinitis, unspecified: Secondary | ICD-10-CM | POA: Diagnosis not present

## 2017-12-19 DIAGNOSIS — J019 Acute sinusitis, unspecified: Secondary | ICD-10-CM | POA: Diagnosis not present

## 2017-12-19 DIAGNOSIS — Z6837 Body mass index (BMI) 37.0-37.9, adult: Secondary | ICD-10-CM | POA: Diagnosis not present

## 2017-12-19 DIAGNOSIS — F419 Anxiety disorder, unspecified: Secondary | ICD-10-CM | POA: Diagnosis not present

## 2017-12-26 DIAGNOSIS — Z23 Encounter for immunization: Secondary | ICD-10-CM | POA: Diagnosis not present

## 2018-01-16 DIAGNOSIS — E538 Deficiency of other specified B group vitamins: Secondary | ICD-10-CM | POA: Diagnosis not present

## 2018-01-25 DIAGNOSIS — H353221 Exudative age-related macular degeneration, left eye, with active choroidal neovascularization: Secondary | ICD-10-CM | POA: Diagnosis not present

## 2018-01-30 DIAGNOSIS — J4540 Moderate persistent asthma, uncomplicated: Secondary | ICD-10-CM | POA: Diagnosis not present

## 2018-01-30 DIAGNOSIS — G4733 Obstructive sleep apnea (adult) (pediatric): Secondary | ICD-10-CM | POA: Diagnosis not present

## 2018-01-30 DIAGNOSIS — R5383 Other fatigue: Secondary | ICD-10-CM | POA: Diagnosis not present

## 2018-01-30 DIAGNOSIS — J301 Allergic rhinitis due to pollen: Secondary | ICD-10-CM | POA: Diagnosis not present

## 2018-02-01 DIAGNOSIS — Z6838 Body mass index (BMI) 38.0-38.9, adult: Secondary | ICD-10-CM | POA: Diagnosis not present

## 2018-02-01 DIAGNOSIS — Z0000 Encounter for general adult medical examination without abnormal findings: Secondary | ICD-10-CM | POA: Diagnosis not present

## 2018-02-01 DIAGNOSIS — I10 Essential (primary) hypertension: Secondary | ICD-10-CM | POA: Diagnosis not present

## 2018-02-01 DIAGNOSIS — Z1211 Encounter for screening for malignant neoplasm of colon: Secondary | ICD-10-CM | POA: Diagnosis not present

## 2018-02-07 DIAGNOSIS — Z6838 Body mass index (BMI) 38.0-38.9, adult: Secondary | ICD-10-CM | POA: Diagnosis not present

## 2018-02-07 DIAGNOSIS — F419 Anxiety disorder, unspecified: Secondary | ICD-10-CM | POA: Diagnosis not present

## 2018-02-07 DIAGNOSIS — J209 Acute bronchitis, unspecified: Secondary | ICD-10-CM | POA: Diagnosis not present

## 2018-02-07 DIAGNOSIS — R6889 Other general symptoms and signs: Secondary | ICD-10-CM | POA: Diagnosis not present

## 2018-02-07 DIAGNOSIS — J029 Acute pharyngitis, unspecified: Secondary | ICD-10-CM | POA: Diagnosis not present

## 2018-03-01 DIAGNOSIS — E538 Deficiency of other specified B group vitamins: Secondary | ICD-10-CM | POA: Diagnosis not present

## 2018-03-01 DIAGNOSIS — Z6838 Body mass index (BMI) 38.0-38.9, adult: Secondary | ICD-10-CM | POA: Diagnosis not present

## 2018-03-01 DIAGNOSIS — M7989 Other specified soft tissue disorders: Secondary | ICD-10-CM | POA: Diagnosis not present

## 2018-03-01 DIAGNOSIS — E559 Vitamin D deficiency, unspecified: Secondary | ICD-10-CM | POA: Diagnosis not present

## 2018-03-01 DIAGNOSIS — I10 Essential (primary) hypertension: Secondary | ICD-10-CM | POA: Diagnosis not present

## 2018-03-01 DIAGNOSIS — Z79899 Other long term (current) drug therapy: Secondary | ICD-10-CM | POA: Diagnosis not present

## 2018-03-07 DIAGNOSIS — H353221 Exudative age-related macular degeneration, left eye, with active choroidal neovascularization: Secondary | ICD-10-CM | POA: Diagnosis not present

## 2018-03-15 DIAGNOSIS — M7989 Other specified soft tissue disorders: Secondary | ICD-10-CM | POA: Diagnosis not present

## 2018-03-15 DIAGNOSIS — I82419 Acute embolism and thrombosis of unspecified femoral vein: Secondary | ICD-10-CM | POA: Diagnosis not present

## 2018-03-15 DIAGNOSIS — I10 Essential (primary) hypertension: Secondary | ICD-10-CM | POA: Diagnosis not present

## 2018-03-15 DIAGNOSIS — Z6839 Body mass index (BMI) 39.0-39.9, adult: Secondary | ICD-10-CM | POA: Diagnosis not present

## 2018-03-23 DIAGNOSIS — Z6838 Body mass index (BMI) 38.0-38.9, adult: Secondary | ICD-10-CM | POA: Diagnosis not present

## 2018-03-23 DIAGNOSIS — Z86718 Personal history of other venous thrombosis and embolism: Secondary | ICD-10-CM | POA: Diagnosis not present

## 2018-03-23 DIAGNOSIS — Z23 Encounter for immunization: Secondary | ICD-10-CM | POA: Diagnosis not present

## 2018-03-23 DIAGNOSIS — S81801A Unspecified open wound, right lower leg, initial encounter: Secondary | ICD-10-CM | POA: Diagnosis not present

## 2018-05-05 DIAGNOSIS — Z86718 Personal history of other venous thrombosis and embolism: Secondary | ICD-10-CM | POA: Diagnosis not present

## 2018-05-05 DIAGNOSIS — R110 Nausea: Secondary | ICD-10-CM | POA: Diagnosis not present

## 2018-05-05 DIAGNOSIS — Z6838 Body mass index (BMI) 38.0-38.9, adult: Secondary | ICD-10-CM | POA: Diagnosis not present

## 2018-05-05 DIAGNOSIS — E669 Obesity, unspecified: Secondary | ICD-10-CM | POA: Diagnosis not present

## 2018-05-11 DIAGNOSIS — Z86718 Personal history of other venous thrombosis and embolism: Secondary | ICD-10-CM | POA: Diagnosis not present

## 2018-05-11 DIAGNOSIS — B379 Candidiasis, unspecified: Secondary | ICD-10-CM | POA: Diagnosis not present

## 2018-05-11 DIAGNOSIS — L03116 Cellulitis of left lower limb: Secondary | ICD-10-CM | POA: Diagnosis not present

## 2018-05-11 DIAGNOSIS — M7989 Other specified soft tissue disorders: Secondary | ICD-10-CM | POA: Diagnosis not present

## 2018-05-19 DIAGNOSIS — Z86718 Personal history of other venous thrombosis and embolism: Secondary | ICD-10-CM | POA: Diagnosis not present

## 2018-05-19 DIAGNOSIS — Z86711 Personal history of pulmonary embolism: Secondary | ICD-10-CM | POA: Diagnosis not present

## 2018-05-22 DIAGNOSIS — M7989 Other specified soft tissue disorders: Secondary | ICD-10-CM | POA: Diagnosis not present

## 2018-05-22 DIAGNOSIS — E1140 Type 2 diabetes mellitus with diabetic neuropathy, unspecified: Secondary | ICD-10-CM | POA: Diagnosis not present

## 2018-05-22 DIAGNOSIS — E538 Deficiency of other specified B group vitamins: Secondary | ICD-10-CM | POA: Diagnosis not present

## 2018-05-22 DIAGNOSIS — I10 Essential (primary) hypertension: Secondary | ICD-10-CM | POA: Diagnosis not present

## 2018-05-25 DIAGNOSIS — E1140 Type 2 diabetes mellitus with diabetic neuropathy, unspecified: Secondary | ICD-10-CM | POA: Diagnosis not present

## 2018-05-25 DIAGNOSIS — E538 Deficiency of other specified B group vitamins: Secondary | ICD-10-CM | POA: Diagnosis not present

## 2018-05-25 DIAGNOSIS — Z79899 Other long term (current) drug therapy: Secondary | ICD-10-CM | POA: Diagnosis not present

## 2018-06-05 DIAGNOSIS — M79669 Pain in unspecified lower leg: Secondary | ICD-10-CM | POA: Diagnosis not present

## 2018-06-05 DIAGNOSIS — E669 Obesity, unspecified: Secondary | ICD-10-CM | POA: Diagnosis not present

## 2018-06-05 DIAGNOSIS — M7989 Other specified soft tissue disorders: Secondary | ICD-10-CM | POA: Diagnosis not present

## 2018-06-05 DIAGNOSIS — Z6837 Body mass index (BMI) 37.0-37.9, adult: Secondary | ICD-10-CM | POA: Diagnosis not present

## 2018-06-09 DIAGNOSIS — K219 Gastro-esophageal reflux disease without esophagitis: Secondary | ICD-10-CM | POA: Diagnosis not present

## 2018-06-09 DIAGNOSIS — M1990 Unspecified osteoarthritis, unspecified site: Secondary | ICD-10-CM | POA: Diagnosis not present

## 2018-06-09 DIAGNOSIS — Z7901 Long term (current) use of anticoagulants: Secondary | ICD-10-CM | POA: Diagnosis not present

## 2018-06-09 DIAGNOSIS — R0602 Shortness of breath: Secondary | ICD-10-CM | POA: Diagnosis not present

## 2018-06-09 DIAGNOSIS — Z79899 Other long term (current) drug therapy: Secondary | ICD-10-CM | POA: Diagnosis not present

## 2018-06-09 DIAGNOSIS — I252 Old myocardial infarction: Secondary | ICD-10-CM | POA: Diagnosis not present

## 2018-06-09 DIAGNOSIS — E7800 Pure hypercholesterolemia, unspecified: Secondary | ICD-10-CM | POA: Diagnosis not present

## 2018-06-09 DIAGNOSIS — Z86711 Personal history of pulmonary embolism: Secondary | ICD-10-CM | POA: Diagnosis not present

## 2018-06-09 DIAGNOSIS — M7989 Other specified soft tissue disorders: Secondary | ICD-10-CM | POA: Diagnosis not present

## 2018-06-09 DIAGNOSIS — E119 Type 2 diabetes mellitus without complications: Secondary | ICD-10-CM | POA: Diagnosis not present

## 2018-06-09 DIAGNOSIS — I10 Essential (primary) hypertension: Secondary | ICD-10-CM | POA: Diagnosis not present

## 2018-06-09 DIAGNOSIS — I82412 Acute embolism and thrombosis of left femoral vein: Secondary | ICD-10-CM | POA: Diagnosis not present

## 2018-06-09 DIAGNOSIS — J45909 Unspecified asthma, uncomplicated: Secondary | ICD-10-CM | POA: Diagnosis not present

## 2018-06-13 DIAGNOSIS — Z09 Encounter for follow-up examination after completed treatment for conditions other than malignant neoplasm: Secondary | ICD-10-CM | POA: Diagnosis not present

## 2018-06-13 DIAGNOSIS — F419 Anxiety disorder, unspecified: Secondary | ICD-10-CM | POA: Diagnosis not present

## 2018-06-13 DIAGNOSIS — M7989 Other specified soft tissue disorders: Secondary | ICD-10-CM | POA: Diagnosis not present

## 2018-06-13 DIAGNOSIS — I82402 Acute embolism and thrombosis of unspecified deep veins of left lower extremity: Secondary | ICD-10-CM | POA: Diagnosis not present

## 2018-07-19 DIAGNOSIS — H353221 Exudative age-related macular degeneration, left eye, with active choroidal neovascularization: Secondary | ICD-10-CM | POA: Diagnosis not present

## 2018-07-21 DIAGNOSIS — Z6836 Body mass index (BMI) 36.0-36.9, adult: Secondary | ICD-10-CM | POA: Diagnosis not present

## 2018-07-21 DIAGNOSIS — I82402 Acute embolism and thrombosis of unspecified deep veins of left lower extremity: Secondary | ICD-10-CM | POA: Diagnosis not present

## 2018-07-21 DIAGNOSIS — M7989 Other specified soft tissue disorders: Secondary | ICD-10-CM | POA: Diagnosis not present

## 2018-07-21 DIAGNOSIS — E669 Obesity, unspecified: Secondary | ICD-10-CM | POA: Diagnosis not present

## 2018-08-10 ENCOUNTER — Ambulatory Visit (INDEPENDENT_AMBULATORY_CARE_PROVIDER_SITE_OTHER): Payer: Medicare Other | Admitting: Vascular Surgery

## 2018-08-10 ENCOUNTER — Encounter: Payer: Self-pay | Admitting: Vascular Surgery

## 2018-08-10 ENCOUNTER — Other Ambulatory Visit: Payer: Self-pay

## 2018-08-10 VITALS — BP 142/71 | HR 78 | Temp 97.9°F | Resp 16 | Ht 65.0 in | Wt 225.2 lb

## 2018-08-10 DIAGNOSIS — I82402 Acute embolism and thrombosis of unspecified deep veins of left lower extremity: Secondary | ICD-10-CM

## 2018-08-10 NOTE — Progress Notes (Signed)
REASON FOR CONSULT:    History of left lower extremity DVT.  The consult is requested by Dr. Rica Records  ASSESSMENT & PLAN:   CHRONIC LEFT LOWER EXTREMITY DVT: This patient has had recurrent DVT of the left lower extremity and is now on Eliquis.  I would agree with continuing this indefinitely and less she can ultimately undergo hematologic work-up for a hypercoagulable state.  She developed a recurrent DVT very quickly once this had been discontinued.  I have discussed with her the importance of intermittent leg elevation and the proper positioning for this.  I think this is will be especially important to help resolve chronic clot and reduce her swelling in the left leg.  I have also written her a prescription for thigh-high compression stockings with a gradient of 15 to 20 mmHg.  I encouraged her to avoid prolonged sitting and standing.  We discussed the importance of exercise.  I think it probably would be worth repeating her venous duplex scan in about 6 months as a baseline to see if she is had any resolution of her chronic clot.  She would like to have this done by her primary care physician which I think would be a lot more convenient than coming back here.  I will be happy to see her back at any time if her symptoms or swelling progressed.  Kathryn Mayo, MD, FACS Beeper [phone] Office: [phone]   HPI:   [name] is a pleasant 73 y.o. female, with a history of DVT of the left lower extremity.  The patient sent for vascular consultation.  I have reviewed the records from the referring office.  The patient was seen on 07/06/2018.  This was a telemedicine visit.  Patient is on Eliquis for a history of a DVT.  At the time of that visit her left leg swelling had improved.  Patient has had a recent venous duplex scan in the emergency department which showed some progression of the DVT in her left lower extremity and her Eliquis dose was increased from 5 mg twice daily to 10 mg twice daily.   She does have a history of type 2 diabetes.  On my history the patient developed a DVT of the left lower extremity in late February 2019.  She tells me that she had extensive left lower extremity swelling.  In addition she had a pulmonary embolus at that time.  She was placed on Eliquis at that time.  She was on it for approximately a year and then the Eliquis was discontinued.  However 3 to 4 weeks later she developed a recurrent DVT in the left lower extremity so she is back on this.  She does try to elevate her legs some.  She wears thigh-high compression stockings which do help with her swelling.  She does describe some achiness and heaviness in her legs which is associated with standing and sitting and relieved somewhat with elevation.  She is unaware of any family history of clotting disorders.  Past Medical History:  Diagnosis Date   Chronic cough    Sinus CT ned 03-19-10; allergy eval neg 03-19-2010>>>IgE 38 pos only for grass; Singulair tiral 03-19-2010   Hilar adenopathy    CT 02-24-2010   Other and unspecified hyperlipidemia    Other pulmonary embolism and infarction    Pneumonia, organism unspecified(486)    Unspecified asthma(493.90)     Family History  Problem Relation Age of Onset   Colon cancer Cousin    Breast  cancer Cousin    Rheum arthritis Mother     SOCIAL HISTORY: Social History   Socioeconomic History   Marital status: Married    Spouse name: Not on file   Number of children: Not on file   Years of education: Not on file   Highest education level: Not on file  Occupational History   Occupation: retired     Comment: Film/video editor strain: Not on file   Food insecurity    Worry: Not on file    Inability: Not on Lexicographer needs    Medical: Not on file    Non-medical: Not on file  Tobacco Use   Smoking status: Never Smoker   Smokeless tobacco: Never Used  Substance and Sexual  Activity   Alcohol use: No   Drug use: Not on file   Sexual activity: Not on file  Lifestyle   Physical activity    Days per week: Not on file    Minutes per session: Not on file   Stress: Not on file  Relationships   Social connections    Talks on phone: Not on file    Gets together: Not on file    Attends religious service: Not on file    Active member of club or organization: Not on file    Attends meetings of clubs or organizations: Not on file    Relationship status: Not on file   Intimate partner violence    Fear of current or ex partner: Not on file    Emotionally abused: Not on file    Physically abused: Not on file    Forced sexual activity: Not on file  Other Topics Concern   Not on file  Social History Narrative   Not on file    Allergies  Allergen Reactions   Prednisone     REACTION: mood swings    Current Outpatient Medications  Medication Sig Dispense Refill   albuterol (PROVENTIL HFA) 108 (90 BASE) MCG/ACT inhaler Inhale 2 puffs into the lungs every 6 (six) hours as needed.       apixaban (ELIQUIS) 5 MG TABS tablet Take 5 mg by mouth 2 (two) times daily.     carvedilol (COREG) 3.125 MG tablet Take 3.125 mg by mouth 2 (two) times daily with a meal.     Choline Fenofibrate (TRILIPIX) 135 MG capsule Take 135 mg by mouth daily.       citalopram (CELEXA) 40 MG tablet Take 1 tablet (40 mg total) by mouth daily.     fluticasone (FLONASE) 50 MCG/ACT nasal spray Place into both nostrils daily.     furosemide (LASIX) 20 MG tablet Take 20 mg by mouth daily.       LORazepam (ATIVAN) 0.5 MG tablet Take 0.5 mg by mouth every 8 (eight) hours.     losartan (COZAAR) 50 MG tablet Take 50 mg by mouth daily.       metFORMIN (GLUCOPHAGE) 500 MG tablet Take 500 mg by mouth daily with breakfast.     montelukast (SINGULAIR) 10 MG tablet Take 10 mg by mouth at bedtime.       potassium chloride (KLOR-CON) 8 MEQ tablet Take 8 mEq by mouth daily.     aspirin  81 MG tablet Take 81 mg by mouth daily.       HYDROcodone-homatropine (HYDROMET) 5-1.5 MG/5ML syrup 1-2 teaspoons by mouth every 6 hours as needed (Patient not taking: Reported on 08/10/2018)  mometasone (NASONEX) 50 MCG/ACT nasal spray 2 sprays by Nasal route 2 (two) times daily as needed.     omeprazole (PRILOSEC) 20 MG capsule Take 1 capsule (20 mg total) by mouth daily.     ranitidine (ZANTAC) 300 MG capsule Take 1 capsule (300 mg total) by mouth at bedtime.     sodium chloride (OCEAN NASAL SPRAY) 0.65 % nasal spray 2 sprays by Nasal route every 4 (four) hours as needed for congestion.     traMADol (ULTRAM) 50 MG tablet 1-2 every 4 hours as needed for cough or chest pain     No current facility-administered medications for this visit.     REVIEW OF SYSTEMS:  [X]  denotes positive finding, [ ]  denotes negative finding Cardiac  Comments:  Chest pain or chest pressure:    Shortness of breath upon exertion: x   Short of breath when lying flat:    Irregular heart rhythm:        Vascular    Pain in calf, thigh, or hip brought on by ambulation:    Pain in feet at night that wakes you up from your sleep:     Blood clot in your veins: x   Leg swelling:  x       Pulmonary    Oxygen at home:    Productive cough:     Wheezing:  x       Neurologic    Sudden weakness in arms or legs:     Sudden numbness in arms or legs:     Sudden onset of difficulty speaking or slurred speech:    Temporary loss of vision in one eye:     Problems with dizziness:         Gastrointestinal    Blood in stool:     Vomited blood:         Genitourinary    Burning when urinating:     Blood in urine:        Psychiatric    Major depression:         Hematologic    Bleeding problems:    Problems with blood clotting too easily:        Skin    Rashes or ulcers:        Constitutional    Fever or chills:     PHYSICAL EXAM:   Vitals:   08/10/18 1452  BP: (!) 142/71  Pulse: 78  Resp: 16   Temp: 97.9 F (36.6 C)  TempSrc: Temporal  SpO2: 95%  Weight: 225 lb 3.2 oz (102.2 kg)  Height: 5\' 5"  (1.651 m)    GENERAL: The patient is a well-nourished female, in no acute distress. The vital signs are documented above. CARDIAC: There is a regular rate and rhythm.  VASCULAR: I do not detect carotid bruits. She has biphasic Doppler signals in the dorsalis pedis and posterior tibial positions bilaterally. She has a palpable right dorsalis pedis pulse.  Because of the swelling in the left leg it was difficult to palpate pedal pulses on the left. She has bilateral lower extremity swelling which is more significant on the left side.  RIGHT LEG CIRCUMFERENCE 15-1/2 INCHES  LEFT LEG CIRCUMFERENCE 17 INCHES     PULMONARY: There is good air exchange bilaterally without wheezing or rales. ABDOMEN: Soft and non-tender with normal pitched bowel sounds.  MUSCULOSKELETAL: There are no major deformities or cyanosis. NEUROLOGIC: No focal weakness or paresthesias are detected. SKIN: There are no ulcers or rashes  noted. PSYCHIATRIC: The patient has a normal affect.  DATA:    VENOUS DUPLEX: I did review the venous duplex scan that was done on 03/15/2018 at [facility].  This showed evidence of residual chronic thrombus in the left femoral vein which had improved from a previous study.  CT chest: I did review the CT chest that was done on 05/16/2017.  This showed no evidence of pulmonary embolus.

## 2018-08-30 DIAGNOSIS — E669 Obesity, unspecified: Secondary | ICD-10-CM | POA: Diagnosis not present

## 2018-08-30 DIAGNOSIS — M7989 Other specified soft tissue disorders: Secondary | ICD-10-CM | POA: Diagnosis not present

## 2018-08-30 DIAGNOSIS — Z6836 Body mass index (BMI) 36.0-36.9, adult: Secondary | ICD-10-CM | POA: Diagnosis not present

## 2018-08-30 DIAGNOSIS — F419 Anxiety disorder, unspecified: Secondary | ICD-10-CM | POA: Diagnosis not present

## 2018-09-01 DIAGNOSIS — H353221 Exudative age-related macular degeneration, left eye, with active choroidal neovascularization: Secondary | ICD-10-CM | POA: Diagnosis not present

## 2018-09-05 DIAGNOSIS — E559 Vitamin D deficiency, unspecified: Secondary | ICD-10-CM | POA: Diagnosis not present

## 2018-09-05 DIAGNOSIS — Z79899 Other long term (current) drug therapy: Secondary | ICD-10-CM | POA: Diagnosis not present

## 2018-09-05 DIAGNOSIS — E1140 Type 2 diabetes mellitus with diabetic neuropathy, unspecified: Secondary | ICD-10-CM | POA: Diagnosis not present

## 2018-09-05 DIAGNOSIS — E538 Deficiency of other specified B group vitamins: Secondary | ICD-10-CM | POA: Diagnosis not present

## 2018-10-10 DIAGNOSIS — J309 Allergic rhinitis, unspecified: Secondary | ICD-10-CM | POA: Diagnosis not present

## 2018-10-10 DIAGNOSIS — F419 Anxiety disorder, unspecified: Secondary | ICD-10-CM | POA: Diagnosis not present

## 2018-10-10 DIAGNOSIS — J209 Acute bronchitis, unspecified: Secondary | ICD-10-CM | POA: Diagnosis not present

## 2018-10-10 DIAGNOSIS — Z6836 Body mass index (BMI) 36.0-36.9, adult: Secondary | ICD-10-CM | POA: Diagnosis not present

## 2018-10-18 DIAGNOSIS — J209 Acute bronchitis, unspecified: Secondary | ICD-10-CM | POA: Diagnosis not present

## 2018-11-01 DIAGNOSIS — H353231 Exudative age-related macular degeneration, bilateral, with active choroidal neovascularization: Secondary | ICD-10-CM | POA: Diagnosis not present

## 2018-11-08 DIAGNOSIS — F419 Anxiety disorder, unspecified: Secondary | ICD-10-CM | POA: Diagnosis not present

## 2018-11-08 DIAGNOSIS — M79669 Pain in unspecified lower leg: Secondary | ICD-10-CM | POA: Diagnosis not present

## 2018-11-08 DIAGNOSIS — E785 Hyperlipidemia, unspecified: Secondary | ICD-10-CM | POA: Diagnosis not present

## 2018-11-29 ENCOUNTER — Other Ambulatory Visit: Payer: Self-pay

## 2018-12-08 DIAGNOSIS — M79669 Pain in unspecified lower leg: Secondary | ICD-10-CM | POA: Diagnosis not present

## 2018-12-08 DIAGNOSIS — E669 Obesity, unspecified: Secondary | ICD-10-CM | POA: Diagnosis not present

## 2018-12-08 DIAGNOSIS — Z6836 Body mass index (BMI) 36.0-36.9, adult: Secondary | ICD-10-CM | POA: Diagnosis not present

## 2018-12-08 DIAGNOSIS — F419 Anxiety disorder, unspecified: Secondary | ICD-10-CM | POA: Diagnosis not present

## 2018-12-13 DIAGNOSIS — H353231 Exudative age-related macular degeneration, bilateral, with active choroidal neovascularization: Secondary | ICD-10-CM | POA: Diagnosis not present

## 2018-12-15 DIAGNOSIS — Z79899 Other long term (current) drug therapy: Secondary | ICD-10-CM | POA: Diagnosis not present

## 2018-12-15 DIAGNOSIS — E559 Vitamin D deficiency, unspecified: Secondary | ICD-10-CM | POA: Diagnosis not present

## 2019-01-11 DIAGNOSIS — F419 Anxiety disorder, unspecified: Secondary | ICD-10-CM | POA: Diagnosis not present

## 2019-01-11 DIAGNOSIS — M5440 Lumbago with sciatica, unspecified side: Secondary | ICD-10-CM | POA: Diagnosis not present

## 2019-01-11 DIAGNOSIS — M79605 Pain in left leg: Secondary | ICD-10-CM | POA: Diagnosis not present

## 2019-01-11 DIAGNOSIS — M25552 Pain in left hip: Secondary | ICD-10-CM | POA: Diagnosis not present

## 2019-01-17 DIAGNOSIS — R600 Localized edema: Secondary | ICD-10-CM | POA: Diagnosis not present

## 2019-01-17 DIAGNOSIS — M79605 Pain in left leg: Secondary | ICD-10-CM | POA: Diagnosis not present

## 2019-01-17 DIAGNOSIS — M7989 Other specified soft tissue disorders: Secondary | ICD-10-CM | POA: Diagnosis not present

## 2019-01-18 DIAGNOSIS — H353231 Exudative age-related macular degeneration, bilateral, with active choroidal neovascularization: Secondary | ICD-10-CM | POA: Diagnosis not present

## 2019-01-22 DIAGNOSIS — S76312A Strain of muscle, fascia and tendon of the posterior muscle group at thigh level, left thigh, initial encounter: Secondary | ICD-10-CM | POA: Diagnosis not present

## 2019-02-22 DIAGNOSIS — M159 Polyosteoarthritis, unspecified: Secondary | ICD-10-CM | POA: Diagnosis not present

## 2019-02-22 DIAGNOSIS — M79669 Pain in unspecified lower leg: Secondary | ICD-10-CM | POA: Diagnosis not present

## 2019-02-22 DIAGNOSIS — I10 Essential (primary) hypertension: Secondary | ICD-10-CM | POA: Diagnosis not present

## 2019-02-22 DIAGNOSIS — F419 Anxiety disorder, unspecified: Secondary | ICD-10-CM | POA: Diagnosis not present

## 2019-02-28 DIAGNOSIS — H353231 Exudative age-related macular degeneration, bilateral, with active choroidal neovascularization: Secondary | ICD-10-CM | POA: Diagnosis not present

## 2019-03-05 DIAGNOSIS — S76312A Strain of muscle, fascia and tendon of the posterior muscle group at thigh level, left thigh, initial encounter: Secondary | ICD-10-CM | POA: Diagnosis not present

## 2019-03-09 DIAGNOSIS — S76312A Strain of muscle, fascia and tendon of the posterior muscle group at thigh level, left thigh, initial encounter: Secondary | ICD-10-CM | POA: Diagnosis not present

## 2019-03-09 DIAGNOSIS — X58XXXA Exposure to other specified factors, initial encounter: Secondary | ICD-10-CM | POA: Diagnosis not present

## 2019-03-09 DIAGNOSIS — M1612 Unilateral primary osteoarthritis, left hip: Secondary | ICD-10-CM | POA: Diagnosis not present

## 2019-03-14 DIAGNOSIS — M76892 Other specified enthesopathies of left lower limb, excluding foot: Secondary | ICD-10-CM | POA: Diagnosis not present

## 2019-03-14 DIAGNOSIS — M1612 Unilateral primary osteoarthritis, left hip: Secondary | ICD-10-CM | POA: Diagnosis not present

## 2019-03-21 DIAGNOSIS — M25512 Pain in left shoulder: Secondary | ICD-10-CM | POA: Diagnosis not present

## 2019-03-21 DIAGNOSIS — M25511 Pain in right shoulder: Secondary | ICD-10-CM | POA: Diagnosis not present

## 2019-03-26 DIAGNOSIS — K219 Gastro-esophageal reflux disease without esophagitis: Secondary | ICD-10-CM | POA: Diagnosis not present

## 2019-03-26 DIAGNOSIS — I10 Essential (primary) hypertension: Secondary | ICD-10-CM | POA: Diagnosis not present

## 2019-03-26 DIAGNOSIS — M79669 Pain in unspecified lower leg: Secondary | ICD-10-CM | POA: Diagnosis not present

## 2019-03-26 DIAGNOSIS — F419 Anxiety disorder, unspecified: Secondary | ICD-10-CM | POA: Diagnosis not present

## 2019-04-18 DIAGNOSIS — H353231 Exudative age-related macular degeneration, bilateral, with active choroidal neovascularization: Secondary | ICD-10-CM | POA: Diagnosis not present

## 2019-05-01 DIAGNOSIS — M5440 Lumbago with sciatica, unspecified side: Secondary | ICD-10-CM | POA: Diagnosis not present

## 2019-05-01 DIAGNOSIS — F419 Anxiety disorder, unspecified: Secondary | ICD-10-CM | POA: Diagnosis not present

## 2019-05-01 DIAGNOSIS — E559 Vitamin D deficiency, unspecified: Secondary | ICD-10-CM | POA: Diagnosis not present

## 2019-05-01 DIAGNOSIS — Z86718 Personal history of other venous thrombosis and embolism: Secondary | ICD-10-CM | POA: Diagnosis not present

## 2019-05-01 DIAGNOSIS — E1121 Type 2 diabetes mellitus with diabetic nephropathy: Secondary | ICD-10-CM | POA: Diagnosis not present

## 2019-05-01 DIAGNOSIS — Z1331 Encounter for screening for depression: Secondary | ICD-10-CM | POA: Diagnosis not present

## 2019-05-01 DIAGNOSIS — E785 Hyperlipidemia, unspecified: Secondary | ICD-10-CM | POA: Diagnosis not present

## 2019-05-01 DIAGNOSIS — E538 Deficiency of other specified B group vitamins: Secondary | ICD-10-CM | POA: Diagnosis not present

## 2019-05-30 DIAGNOSIS — H353231 Exudative age-related macular degeneration, bilateral, with active choroidal neovascularization: Secondary | ICD-10-CM | POA: Diagnosis not present

## 2019-06-01 DIAGNOSIS — K047 Periapical abscess without sinus: Secondary | ICD-10-CM | POA: Diagnosis not present

## 2019-06-01 DIAGNOSIS — E559 Vitamin D deficiency, unspecified: Secondary | ICD-10-CM | POA: Diagnosis not present

## 2019-06-13 DIAGNOSIS — I10 Essential (primary) hypertension: Secondary | ICD-10-CM | POA: Diagnosis not present

## 2019-06-13 DIAGNOSIS — M7989 Other specified soft tissue disorders: Secondary | ICD-10-CM | POA: Diagnosis not present

## 2019-06-13 DIAGNOSIS — M79669 Pain in unspecified lower leg: Secondary | ICD-10-CM | POA: Diagnosis not present

## 2019-06-13 DIAGNOSIS — F419 Anxiety disorder, unspecified: Secondary | ICD-10-CM | POA: Diagnosis not present

## 2019-08-01 DIAGNOSIS — M159 Polyosteoarthritis, unspecified: Secondary | ICD-10-CM | POA: Diagnosis not present

## 2019-08-01 DIAGNOSIS — E785 Hyperlipidemia, unspecified: Secondary | ICD-10-CM | POA: Diagnosis not present

## 2019-08-01 DIAGNOSIS — E538 Deficiency of other specified B group vitamins: Secondary | ICD-10-CM | POA: Diagnosis not present

## 2019-08-01 DIAGNOSIS — E1121 Type 2 diabetes mellitus with diabetic nephropathy: Secondary | ICD-10-CM | POA: Diagnosis not present

## 2019-08-01 DIAGNOSIS — F419 Anxiety disorder, unspecified: Secondary | ICD-10-CM | POA: Diagnosis not present

## 2019-08-01 DIAGNOSIS — K219 Gastro-esophageal reflux disease without esophagitis: Secondary | ICD-10-CM | POA: Diagnosis not present

## 2019-08-01 DIAGNOSIS — E669 Obesity, unspecified: Secondary | ICD-10-CM | POA: Diagnosis not present

## 2019-08-01 DIAGNOSIS — Z6835 Body mass index (BMI) 35.0-35.9, adult: Secondary | ICD-10-CM | POA: Diagnosis not present

## 2019-08-15 DIAGNOSIS — H353221 Exudative age-related macular degeneration, left eye, with active choroidal neovascularization: Secondary | ICD-10-CM | POA: Diagnosis not present

## 2019-08-27 DIAGNOSIS — J019 Acute sinusitis, unspecified: Secondary | ICD-10-CM | POA: Diagnosis not present

## 2019-08-27 DIAGNOSIS — F419 Anxiety disorder, unspecified: Secondary | ICD-10-CM | POA: Diagnosis not present

## 2019-09-18 DIAGNOSIS — R5383 Other fatigue: Secondary | ICD-10-CM | POA: Diagnosis not present

## 2019-09-18 DIAGNOSIS — N182 Chronic kidney disease, stage 2 (mild): Secondary | ICD-10-CM | POA: Diagnosis not present

## 2019-09-18 DIAGNOSIS — E1140 Type 2 diabetes mellitus with diabetic neuropathy, unspecified: Secondary | ICD-10-CM | POA: Diagnosis not present

## 2019-09-18 DIAGNOSIS — E538 Deficiency of other specified B group vitamins: Secondary | ICD-10-CM | POA: Diagnosis not present

## 2019-09-18 DIAGNOSIS — J309 Allergic rhinitis, unspecified: Secondary | ICD-10-CM | POA: Diagnosis not present

## 2019-09-18 DIAGNOSIS — Z6835 Body mass index (BMI) 35.0-35.9, adult: Secondary | ICD-10-CM | POA: Diagnosis not present

## 2019-09-18 DIAGNOSIS — M2550 Pain in unspecified joint: Secondary | ICD-10-CM | POA: Diagnosis not present

## 2019-09-18 DIAGNOSIS — Z6833 Body mass index (BMI) 33.0-33.9, adult: Secondary | ICD-10-CM | POA: Diagnosis not present

## 2019-09-19 DIAGNOSIS — H353231 Exudative age-related macular degeneration, bilateral, with active choroidal neovascularization: Secondary | ICD-10-CM | POA: Diagnosis not present

## 2019-10-22 DIAGNOSIS — M65351 Trigger finger, right little finger: Secondary | ICD-10-CM | POA: Diagnosis not present

## 2019-11-01 DIAGNOSIS — H353231 Exudative age-related macular degeneration, bilateral, with active choroidal neovascularization: Secondary | ICD-10-CM | POA: Diagnosis not present

## 2019-11-06 DIAGNOSIS — E538 Deficiency of other specified B group vitamins: Secondary | ICD-10-CM | POA: Diagnosis not present

## 2019-11-07 DIAGNOSIS — E785 Hyperlipidemia, unspecified: Secondary | ICD-10-CM | POA: Diagnosis not present

## 2019-11-07 DIAGNOSIS — F419 Anxiety disorder, unspecified: Secondary | ICD-10-CM | POA: Diagnosis not present

## 2019-11-07 DIAGNOSIS — Z6833 Body mass index (BMI) 33.0-33.9, adult: Secondary | ICD-10-CM | POA: Diagnosis not present

## 2019-11-07 DIAGNOSIS — M2550 Pain in unspecified joint: Secondary | ICD-10-CM | POA: Diagnosis not present

## 2019-11-07 DIAGNOSIS — Z2821 Immunization not carried out because of patient refusal: Secondary | ICD-10-CM | POA: Diagnosis not present

## 2019-11-07 DIAGNOSIS — B372 Candidiasis of skin and nail: Secondary | ICD-10-CM | POA: Diagnosis not present

## 2019-11-07 DIAGNOSIS — I10 Essential (primary) hypertension: Secondary | ICD-10-CM | POA: Diagnosis not present

## 2019-11-10 DIAGNOSIS — S81819A Laceration without foreign body, unspecified lower leg, initial encounter: Secondary | ICD-10-CM | POA: Diagnosis not present

## 2019-12-10 DIAGNOSIS — E538 Deficiency of other specified B group vitamins: Secondary | ICD-10-CM | POA: Diagnosis not present

## 2019-12-14 DIAGNOSIS — H353231 Exudative age-related macular degeneration, bilateral, with active choroidal neovascularization: Secondary | ICD-10-CM | POA: Diagnosis not present

## 2019-12-18 DIAGNOSIS — Z6835 Body mass index (BMI) 35.0-35.9, adult: Secondary | ICD-10-CM | POA: Diagnosis not present

## 2019-12-18 DIAGNOSIS — E669 Obesity, unspecified: Secondary | ICD-10-CM | POA: Diagnosis not present

## 2019-12-18 DIAGNOSIS — M19011 Primary osteoarthritis, right shoulder: Secondary | ICD-10-CM | POA: Diagnosis not present

## 2019-12-18 DIAGNOSIS — Z6833 Body mass index (BMI) 33.0-33.9, adult: Secondary | ICD-10-CM | POA: Diagnosis not present

## 2019-12-18 DIAGNOSIS — E538 Deficiency of other specified B group vitamins: Secondary | ICD-10-CM | POA: Diagnosis not present

## 2019-12-18 DIAGNOSIS — E559 Vitamin D deficiency, unspecified: Secondary | ICD-10-CM | POA: Diagnosis not present

## 2019-12-18 DIAGNOSIS — F419 Anxiety disorder, unspecified: Secondary | ICD-10-CM | POA: Diagnosis not present

## 2019-12-18 DIAGNOSIS — Z79899 Other long term (current) drug therapy: Secondary | ICD-10-CM | POA: Diagnosis not present

## 2019-12-18 DIAGNOSIS — E785 Hyperlipidemia, unspecified: Secondary | ICD-10-CM | POA: Diagnosis not present

## 2019-12-18 DIAGNOSIS — E1140 Type 2 diabetes mellitus with diabetic neuropathy, unspecified: Secondary | ICD-10-CM | POA: Diagnosis not present

## 2020-01-24 DIAGNOSIS — H353231 Exudative age-related macular degeneration, bilateral, with active choroidal neovascularization: Secondary | ICD-10-CM | POA: Diagnosis not present

## 2020-02-18 DIAGNOSIS — R1011 Right upper quadrant pain: Secondary | ICD-10-CM | POA: Diagnosis not present

## 2020-02-18 DIAGNOSIS — R1012 Left upper quadrant pain: Secondary | ICD-10-CM | POA: Diagnosis not present

## 2020-02-18 DIAGNOSIS — K219 Gastro-esophageal reflux disease without esophagitis: Secondary | ICD-10-CM | POA: Diagnosis not present

## 2020-02-26 DIAGNOSIS — Z1159 Encounter for screening for other viral diseases: Secondary | ICD-10-CM | POA: Diagnosis not present

## 2020-02-26 DIAGNOSIS — Z20828 Contact with and (suspected) exposure to other viral communicable diseases: Secondary | ICD-10-CM | POA: Diagnosis not present

## 2020-03-04 DIAGNOSIS — R1011 Right upper quadrant pain: Secondary | ICD-10-CM | POA: Diagnosis not present

## 2020-03-04 DIAGNOSIS — K449 Diaphragmatic hernia without obstruction or gangrene: Secondary | ICD-10-CM | POA: Diagnosis not present

## 2020-03-04 DIAGNOSIS — E119 Type 2 diabetes mellitus without complications: Secondary | ICD-10-CM | POA: Diagnosis not present

## 2020-03-04 DIAGNOSIS — K259 Gastric ulcer, unspecified as acute or chronic, without hemorrhage or perforation: Secondary | ICD-10-CM | POA: Diagnosis not present

## 2020-03-04 DIAGNOSIS — K219 Gastro-esophageal reflux disease without esophagitis: Secondary | ICD-10-CM | POA: Diagnosis not present

## 2020-03-04 DIAGNOSIS — I10 Essential (primary) hypertension: Secondary | ICD-10-CM | POA: Diagnosis not present

## 2020-03-04 DIAGNOSIS — R1310 Dysphagia, unspecified: Secondary | ICD-10-CM | POA: Diagnosis not present

## 2020-03-06 DIAGNOSIS — H353231 Exudative age-related macular degeneration, bilateral, with active choroidal neovascularization: Secondary | ICD-10-CM | POA: Diagnosis not present

## 2020-03-12 DIAGNOSIS — L821 Other seborrheic keratosis: Secondary | ICD-10-CM | POA: Diagnosis not present

## 2020-03-12 DIAGNOSIS — C44519 Basal cell carcinoma of skin of other part of trunk: Secondary | ICD-10-CM | POA: Diagnosis not present

## 2020-03-18 DIAGNOSIS — E538 Deficiency of other specified B group vitamins: Secondary | ICD-10-CM | POA: Diagnosis not present

## 2020-03-18 DIAGNOSIS — E669 Obesity, unspecified: Secondary | ICD-10-CM | POA: Diagnosis not present

## 2020-03-18 DIAGNOSIS — E1140 Type 2 diabetes mellitus with diabetic neuropathy, unspecified: Secondary | ICD-10-CM | POA: Diagnosis not present

## 2020-03-18 DIAGNOSIS — F419 Anxiety disorder, unspecified: Secondary | ICD-10-CM | POA: Diagnosis not present

## 2020-03-18 DIAGNOSIS — N289 Disorder of kidney and ureter, unspecified: Secondary | ICD-10-CM | POA: Diagnosis not present

## 2020-03-18 DIAGNOSIS — M19011 Primary osteoarthritis, right shoulder: Secondary | ICD-10-CM | POA: Diagnosis not present

## 2020-03-18 DIAGNOSIS — Z6835 Body mass index (BMI) 35.0-35.9, adult: Secondary | ICD-10-CM | POA: Diagnosis not present

## 2020-03-18 DIAGNOSIS — Z6833 Body mass index (BMI) 33.0-33.9, adult: Secondary | ICD-10-CM | POA: Diagnosis not present

## 2020-04-03 DIAGNOSIS — M25511 Pain in right shoulder: Secondary | ICD-10-CM | POA: Diagnosis not present

## 2020-04-17 DIAGNOSIS — H353231 Exudative age-related macular degeneration, bilateral, with active choroidal neovascularization: Secondary | ICD-10-CM | POA: Diagnosis not present

## 2020-04-21 DIAGNOSIS — E559 Vitamin D deficiency, unspecified: Secondary | ICD-10-CM | POA: Diagnosis not present

## 2020-05-09 DIAGNOSIS — H2513 Age-related nuclear cataract, bilateral: Secondary | ICD-10-CM | POA: Diagnosis not present

## 2020-05-27 DIAGNOSIS — E559 Vitamin D deficiency, unspecified: Secondary | ICD-10-CM | POA: Diagnosis not present

## 2020-05-29 DIAGNOSIS — H353231 Exudative age-related macular degeneration, bilateral, with active choroidal neovascularization: Secondary | ICD-10-CM | POA: Diagnosis not present

## 2020-06-03 DIAGNOSIS — K219 Gastro-esophageal reflux disease without esophagitis: Secondary | ICD-10-CM | POA: Diagnosis not present

## 2020-06-03 DIAGNOSIS — H259 Unspecified age-related cataract: Secondary | ICD-10-CM | POA: Diagnosis not present

## 2020-06-03 DIAGNOSIS — Z7984 Long term (current) use of oral hypoglycemic drugs: Secondary | ICD-10-CM | POA: Diagnosis not present

## 2020-06-03 DIAGNOSIS — F419 Anxiety disorder, unspecified: Secondary | ICD-10-CM | POA: Diagnosis not present

## 2020-06-03 DIAGNOSIS — H2511 Age-related nuclear cataract, right eye: Secondary | ICD-10-CM | POA: Diagnosis not present

## 2020-06-03 DIAGNOSIS — Z86711 Personal history of pulmonary embolism: Secondary | ICD-10-CM | POA: Diagnosis not present

## 2020-06-03 DIAGNOSIS — I110 Hypertensive heart disease with heart failure: Secondary | ICD-10-CM | POA: Diagnosis not present

## 2020-06-03 DIAGNOSIS — I509 Heart failure, unspecified: Secondary | ICD-10-CM | POA: Diagnosis not present

## 2020-06-03 DIAGNOSIS — Z7901 Long term (current) use of anticoagulants: Secondary | ICD-10-CM | POA: Diagnosis not present

## 2020-06-03 DIAGNOSIS — E119 Type 2 diabetes mellitus without complications: Secondary | ICD-10-CM | POA: Diagnosis not present

## 2020-06-03 DIAGNOSIS — Z7982 Long term (current) use of aspirin: Secondary | ICD-10-CM | POA: Diagnosis not present

## 2020-06-03 DIAGNOSIS — I10 Essential (primary) hypertension: Secondary | ICD-10-CM | POA: Diagnosis not present

## 2020-06-03 DIAGNOSIS — H25811 Combined forms of age-related cataract, right eye: Secondary | ICD-10-CM | POA: Diagnosis not present

## 2020-06-03 DIAGNOSIS — E1136 Type 2 diabetes mellitus with diabetic cataract: Secondary | ICD-10-CM | POA: Diagnosis not present

## 2020-06-04 DIAGNOSIS — E1121 Type 2 diabetes mellitus with diabetic nephropathy: Secondary | ICD-10-CM | POA: Diagnosis not present

## 2020-06-04 DIAGNOSIS — J309 Allergic rhinitis, unspecified: Secondary | ICD-10-CM | POA: Diagnosis not present

## 2020-06-04 DIAGNOSIS — F419 Anxiety disorder, unspecified: Secondary | ICD-10-CM | POA: Diagnosis not present

## 2020-06-04 DIAGNOSIS — E669 Obesity, unspecified: Secondary | ICD-10-CM | POA: Diagnosis not present

## 2020-06-04 DIAGNOSIS — M19011 Primary osteoarthritis, right shoulder: Secondary | ICD-10-CM | POA: Diagnosis not present

## 2020-06-04 DIAGNOSIS — Z6833 Body mass index (BMI) 33.0-33.9, adult: Secondary | ICD-10-CM | POA: Diagnosis not present

## 2020-06-04 DIAGNOSIS — G4730 Sleep apnea, unspecified: Secondary | ICD-10-CM | POA: Diagnosis not present

## 2020-06-04 DIAGNOSIS — K219 Gastro-esophageal reflux disease without esophagitis: Secondary | ICD-10-CM | POA: Diagnosis not present

## 2020-06-04 DIAGNOSIS — E538 Deficiency of other specified B group vitamins: Secondary | ICD-10-CM | POA: Diagnosis not present

## 2020-06-04 DIAGNOSIS — E785 Hyperlipidemia, unspecified: Secondary | ICD-10-CM | POA: Diagnosis not present

## 2020-06-04 DIAGNOSIS — I10 Essential (primary) hypertension: Secondary | ICD-10-CM | POA: Diagnosis not present

## 2020-06-04 DIAGNOSIS — E1140 Type 2 diabetes mellitus with diabetic neuropathy, unspecified: Secondary | ICD-10-CM | POA: Diagnosis not present

## 2020-06-11 DIAGNOSIS — Z9181 History of falling: Secondary | ICD-10-CM | POA: Diagnosis not present

## 2020-06-11 DIAGNOSIS — Z0000 Encounter for general adult medical examination without abnormal findings: Secondary | ICD-10-CM | POA: Diagnosis not present

## 2020-06-11 DIAGNOSIS — E785 Hyperlipidemia, unspecified: Secondary | ICD-10-CM | POA: Diagnosis not present

## 2020-06-11 DIAGNOSIS — Z1331 Encounter for screening for depression: Secondary | ICD-10-CM | POA: Diagnosis not present

## 2020-06-18 DIAGNOSIS — E1140 Type 2 diabetes mellitus with diabetic neuropathy, unspecified: Secondary | ICD-10-CM | POA: Diagnosis not present

## 2020-06-18 DIAGNOSIS — E785 Hyperlipidemia, unspecified: Secondary | ICD-10-CM | POA: Diagnosis not present

## 2020-06-18 DIAGNOSIS — Z79899 Other long term (current) drug therapy: Secondary | ICD-10-CM | POA: Diagnosis not present

## 2020-06-18 DIAGNOSIS — E559 Vitamin D deficiency, unspecified: Secondary | ICD-10-CM | POA: Diagnosis not present

## 2020-06-18 DIAGNOSIS — E538 Deficiency of other specified B group vitamins: Secondary | ICD-10-CM | POA: Diagnosis not present

## 2020-07-01 DIAGNOSIS — E559 Vitamin D deficiency, unspecified: Secondary | ICD-10-CM | POA: Diagnosis not present

## 2020-07-10 DIAGNOSIS — H353211 Exudative age-related macular degeneration, right eye, with active choroidal neovascularization: Secondary | ICD-10-CM | POA: Diagnosis not present

## 2020-07-10 DIAGNOSIS — H353221 Exudative age-related macular degeneration, left eye, with active choroidal neovascularization: Secondary | ICD-10-CM | POA: Diagnosis not present

## 2020-07-15 DIAGNOSIS — M65351 Trigger finger, right little finger: Secondary | ICD-10-CM | POA: Insufficient documentation

## 2020-08-05 DIAGNOSIS — E119 Type 2 diabetes mellitus without complications: Secondary | ICD-10-CM | POA: Diagnosis not present

## 2020-08-05 DIAGNOSIS — H2512 Age-related nuclear cataract, left eye: Secondary | ICD-10-CM | POA: Diagnosis not present

## 2020-08-05 DIAGNOSIS — Z01818 Encounter for other preprocedural examination: Secondary | ICD-10-CM | POA: Diagnosis not present

## 2020-08-11 DIAGNOSIS — E119 Type 2 diabetes mellitus without complications: Secondary | ICD-10-CM | POA: Diagnosis not present

## 2020-08-11 DIAGNOSIS — S81819A Laceration without foreign body, unspecified lower leg, initial encounter: Secondary | ICD-10-CM | POA: Diagnosis not present

## 2020-08-11 DIAGNOSIS — Z79899 Other long term (current) drug therapy: Secondary | ICD-10-CM | POA: Diagnosis not present

## 2020-08-11 DIAGNOSIS — S81812A Laceration without foreign body, left lower leg, initial encounter: Secondary | ICD-10-CM | POA: Diagnosis not present

## 2020-08-11 DIAGNOSIS — I10 Essential (primary) hypertension: Secondary | ICD-10-CM | POA: Diagnosis not present

## 2020-08-11 DIAGNOSIS — E785 Hyperlipidemia, unspecified: Secondary | ICD-10-CM | POA: Diagnosis not present

## 2020-08-11 DIAGNOSIS — Z5321 Procedure and treatment not carried out due to patient leaving prior to being seen by health care provider: Secondary | ICD-10-CM | POA: Diagnosis not present

## 2020-08-12 DIAGNOSIS — E119 Type 2 diabetes mellitus without complications: Secondary | ICD-10-CM | POA: Diagnosis not present

## 2020-08-12 DIAGNOSIS — H259 Unspecified age-related cataract: Secondary | ICD-10-CM | POA: Diagnosis not present

## 2020-08-12 DIAGNOSIS — Z7901 Long term (current) use of anticoagulants: Secondary | ICD-10-CM | POA: Diagnosis not present

## 2020-08-12 DIAGNOSIS — H25812 Combined forms of age-related cataract, left eye: Secondary | ICD-10-CM | POA: Diagnosis not present

## 2020-08-12 DIAGNOSIS — Z7984 Long term (current) use of oral hypoglycemic drugs: Secondary | ICD-10-CM | POA: Diagnosis not present

## 2020-08-12 DIAGNOSIS — E1136 Type 2 diabetes mellitus with diabetic cataract: Secondary | ICD-10-CM | POA: Diagnosis not present

## 2020-08-12 DIAGNOSIS — I10 Essential (primary) hypertension: Secondary | ICD-10-CM | POA: Diagnosis not present

## 2020-08-12 DIAGNOSIS — H2512 Age-related nuclear cataract, left eye: Secondary | ICD-10-CM | POA: Diagnosis not present

## 2020-08-13 DIAGNOSIS — Z09 Encounter for follow-up examination after completed treatment for conditions other than malignant neoplasm: Secondary | ICD-10-CM | POA: Diagnosis not present

## 2020-08-13 DIAGNOSIS — I10 Essential (primary) hypertension: Secondary | ICD-10-CM | POA: Diagnosis not present

## 2020-08-13 DIAGNOSIS — S81812A Laceration without foreign body, left lower leg, initial encounter: Secondary | ICD-10-CM | POA: Diagnosis not present

## 2020-08-13 DIAGNOSIS — E669 Obesity, unspecified: Secondary | ICD-10-CM | POA: Diagnosis not present

## 2020-08-13 DIAGNOSIS — Z6833 Body mass index (BMI) 33.0-33.9, adult: Secondary | ICD-10-CM | POA: Diagnosis not present

## 2020-08-21 DIAGNOSIS — M19011 Primary osteoarthritis, right shoulder: Secondary | ICD-10-CM | POA: Diagnosis not present

## 2020-08-21 DIAGNOSIS — E538 Deficiency of other specified B group vitamins: Secondary | ICD-10-CM | POA: Diagnosis not present

## 2020-08-21 DIAGNOSIS — F419 Anxiety disorder, unspecified: Secondary | ICD-10-CM | POA: Diagnosis not present

## 2020-08-21 DIAGNOSIS — S81812A Laceration without foreign body, left lower leg, initial encounter: Secondary | ICD-10-CM | POA: Diagnosis not present

## 2020-08-27 DIAGNOSIS — S81812A Laceration without foreign body, left lower leg, initial encounter: Secondary | ICD-10-CM | POA: Diagnosis not present

## 2020-09-04 DIAGNOSIS — H353211 Exudative age-related macular degeneration, right eye, with active choroidal neovascularization: Secondary | ICD-10-CM | POA: Diagnosis not present

## 2020-09-04 DIAGNOSIS — H353221 Exudative age-related macular degeneration, left eye, with active choroidal neovascularization: Secondary | ICD-10-CM | POA: Diagnosis not present

## 2020-09-05 DIAGNOSIS — S81812A Laceration without foreign body, left lower leg, initial encounter: Secondary | ICD-10-CM | POA: Diagnosis not present

## 2020-09-16 DIAGNOSIS — S81812A Laceration without foreign body, left lower leg, initial encounter: Secondary | ICD-10-CM | POA: Diagnosis not present

## 2020-09-23 DIAGNOSIS — E538 Deficiency of other specified B group vitamins: Secondary | ICD-10-CM | POA: Diagnosis not present

## 2020-10-07 DIAGNOSIS — S81812A Laceration without foreign body, left lower leg, initial encounter: Secondary | ICD-10-CM | POA: Diagnosis not present

## 2020-10-16 DIAGNOSIS — H353221 Exudative age-related macular degeneration, left eye, with active choroidal neovascularization: Secondary | ICD-10-CM | POA: Diagnosis not present

## 2020-10-27 DIAGNOSIS — E538 Deficiency of other specified B group vitamins: Secondary | ICD-10-CM | POA: Diagnosis not present

## 2020-10-31 DIAGNOSIS — S81812A Laceration without foreign body, left lower leg, initial encounter: Secondary | ICD-10-CM | POA: Diagnosis not present

## 2020-11-20 DIAGNOSIS — E669 Obesity, unspecified: Secondary | ICD-10-CM | POA: Diagnosis not present

## 2020-11-20 DIAGNOSIS — M159 Polyosteoarthritis, unspecified: Secondary | ICD-10-CM | POA: Diagnosis not present

## 2020-11-20 DIAGNOSIS — F419 Anxiety disorder, unspecified: Secondary | ICD-10-CM | POA: Diagnosis not present

## 2020-11-20 DIAGNOSIS — G4730 Sleep apnea, unspecified: Secondary | ICD-10-CM | POA: Diagnosis not present

## 2020-11-20 DIAGNOSIS — Z6833 Body mass index (BMI) 33.0-33.9, adult: Secondary | ICD-10-CM | POA: Diagnosis not present

## 2020-11-20 DIAGNOSIS — E1121 Type 2 diabetes mellitus with diabetic nephropathy: Secondary | ICD-10-CM | POA: Diagnosis not present

## 2020-11-20 DIAGNOSIS — E1140 Type 2 diabetes mellitus with diabetic neuropathy, unspecified: Secondary | ICD-10-CM | POA: Diagnosis not present

## 2020-11-20 DIAGNOSIS — M19011 Primary osteoarthritis, right shoulder: Secondary | ICD-10-CM | POA: Diagnosis not present

## 2020-11-20 DIAGNOSIS — Z79899 Other long term (current) drug therapy: Secondary | ICD-10-CM | POA: Diagnosis not present

## 2020-11-20 DIAGNOSIS — E785 Hyperlipidemia, unspecified: Secondary | ICD-10-CM | POA: Diagnosis not present

## 2020-11-20 DIAGNOSIS — S81812A Laceration without foreign body, left lower leg, initial encounter: Secondary | ICD-10-CM | POA: Diagnosis not present

## 2020-11-20 DIAGNOSIS — E538 Deficiency of other specified B group vitamins: Secondary | ICD-10-CM | POA: Diagnosis not present

## 2020-11-20 DIAGNOSIS — I10 Essential (primary) hypertension: Secondary | ICD-10-CM | POA: Diagnosis not present

## 2020-11-24 ENCOUNTER — Other Ambulatory Visit: Payer: Self-pay | Admitting: Orthopedic Surgery

## 2020-11-24 DIAGNOSIS — M65341 Trigger finger, right ring finger: Secondary | ICD-10-CM | POA: Diagnosis not present

## 2020-11-24 DIAGNOSIS — M65351 Trigger finger, right little finger: Secondary | ICD-10-CM | POA: Diagnosis not present

## 2020-11-25 DIAGNOSIS — S81812A Laceration without foreign body, left lower leg, initial encounter: Secondary | ICD-10-CM | POA: Diagnosis not present

## 2020-12-04 DIAGNOSIS — H353221 Exudative age-related macular degeneration, left eye, with active choroidal neovascularization: Secondary | ICD-10-CM | POA: Diagnosis not present

## 2020-12-11 ENCOUNTER — Ambulatory Visit (INDEPENDENT_AMBULATORY_CARE_PROVIDER_SITE_OTHER): Payer: Medicare Other | Admitting: Podiatry

## 2020-12-11 ENCOUNTER — Encounter: Payer: Self-pay | Admitting: Podiatry

## 2020-12-11 ENCOUNTER — Other Ambulatory Visit: Payer: Self-pay | Admitting: *Deleted

## 2020-12-11 ENCOUNTER — Other Ambulatory Visit: Payer: Self-pay

## 2020-12-11 ENCOUNTER — Ambulatory Visit (INDEPENDENT_AMBULATORY_CARE_PROVIDER_SITE_OTHER): Payer: Medicare Other

## 2020-12-11 DIAGNOSIS — M7732 Calcaneal spur, left foot: Secondary | ICD-10-CM | POA: Diagnosis not present

## 2020-12-11 DIAGNOSIS — M216X9 Other acquired deformities of unspecified foot: Secondary | ICD-10-CM | POA: Diagnosis not present

## 2020-12-11 DIAGNOSIS — M79672 Pain in left foot: Secondary | ICD-10-CM | POA: Diagnosis not present

## 2020-12-11 DIAGNOSIS — M722 Plantar fascial fibromatosis: Secondary | ICD-10-CM | POA: Diagnosis not present

## 2020-12-11 MED ORDER — BETAMETHASONE SOD PHOS & ACET 6 (3-3) MG/ML IJ SUSP
6.0000 mg | Freq: Once | INTRAMUSCULAR | Status: AC
Start: 2020-12-11 — End: 2020-12-11
  Administered 2020-12-11: 6 mg

## 2020-12-11 NOTE — Progress Notes (Signed)
  Subjective:  Patient ID: [name], female    DOB: 02/05/1945,  MRN: [id number]  Chief Complaint  Patient presents with   Plantar Fasciitis    I have some heel pain on the left foot and it has been going on since September and I have had blood clots in the foot and there is some swelling and my left foot and leg went thru a deck board back in august of this year and is still healing   75 y.o. female presents with the above complaint. History confirmed with patient.   Objective:  Physical Exam: warm, good capillary refill, no trophic changes or ulcerative lesions, normal DP and PT pulses, and normal sensory exam. Left Foot: tenderness to palpation medial calcaneal tuber, no pain with calcaneal squeeze, decreased ankle joint ROM, and +Silverskiold test; hallux valgus with 2nd hammertoe but no pain  Radiographs: X-ray of the left foot: no evidence of calcaneal stress fracture, plantar calcaneal spur, posterior calcaneal spur, and Haglund deformity noted  Assessment:   1. Plantar fasciitis   2. Pain of left heel   3. Calcaneal spur of left foot   4. Equinus deformity of foot    Plan:  Patient was evaluated and treated and all questions answered.  Plantar Fasciitis -XR reviewed with patient -Educated patient on stretching and icing of the affected limb -Night splint dispensed -Injection delivered to the plantar fascia of the left foot.  Procedure: Injection Tendon/Ligament Consent: Verbal consent obtained. Location: Left plantar fascia at the glabrous junction; medial approach. Skin Prep: Alcohol. Injectate: 1 cc 0.5% marcaine plain, 1 cc betamethasone acetate-betamethasone sodium phosphate Disposition: Patient tolerated procedure well. Injection site dressed with a band-aid.  Return in about 4 weeks (around 01/08/2021) for Plantar fasciitis.

## 2020-12-11 NOTE — Patient Instructions (Signed)

## 2020-12-12 NOTE — Addendum Note (Signed)
Addended by: Cranford Mon R on: 12/12/2020 03:36 PM   Modules accepted: Orders

## 2020-12-16 DIAGNOSIS — L821 Other seborrheic keratosis: Secondary | ICD-10-CM | POA: Diagnosis not present

## 2020-12-16 DIAGNOSIS — L300 Nummular dermatitis: Secondary | ICD-10-CM | POA: Diagnosis not present

## 2020-12-18 ENCOUNTER — Encounter (HOSPITAL_BASED_OUTPATIENT_CLINIC_OR_DEPARTMENT_OTHER): Payer: Self-pay | Admitting: Orthopedic Surgery

## 2020-12-23 ENCOUNTER — Encounter (HOSPITAL_BASED_OUTPATIENT_CLINIC_OR_DEPARTMENT_OTHER)
Admission: RE | Admit: 2020-12-23 | Discharge: 2020-12-23 | Disposition: A | Discharge status: Home / Self Care | Payer: Medicare Other | Source: Ambulatory Visit | Attending: Orthopedic Surgery | Admitting: Orthopedic Surgery

## 2020-12-23 DIAGNOSIS — Z01818 Encounter for other preprocedural examination: Secondary | ICD-10-CM | POA: Insufficient documentation

## 2020-12-23 DIAGNOSIS — I10 Essential (primary) hypertension: Secondary | ICD-10-CM | POA: Diagnosis not present

## 2020-12-23 LAB — BASIC METABOLIC PANEL
Anion gap: 6 (ref 5–15)
BUN: 20 mg/dL (ref 8–23)
CO2: 26 mmol/L (ref 22–32)
Calcium: 8.7 mg/dL — ABNORMAL LOW (ref 8.9–10.3)
Chloride: 105 mmol/L (ref 98–111)
Creatinine, Ser: 1.48 mg/dL — ABNORMAL HIGH (ref 0.44–1.00)
GFR, Estimated: 36 mL/min — ABNORMAL LOW (ref >60)
Glucose, Bld: 78 mg/dL (ref 70–99)
Potassium: 4.5 mmol/L (ref 3.5–5.1)
Sodium: 137 mmol/L (ref 135–145)

## 2020-12-23 NOTE — Progress Notes (Signed)

## 2020-12-24 DIAGNOSIS — D649 Anemia, unspecified: Secondary | ICD-10-CM | POA: Diagnosis not present

## 2020-12-29 DIAGNOSIS — S01111A Laceration without foreign body of right eyelid and periocular area, initial encounter: Secondary | ICD-10-CM | POA: Diagnosis not present

## 2020-12-29 DIAGNOSIS — S0181XA Laceration without foreign body of other part of head, initial encounter: Secondary | ICD-10-CM | POA: Diagnosis not present

## 2020-12-29 DIAGNOSIS — W1839XA Other fall on same level, initial encounter: Secondary | ICD-10-CM | POA: Diagnosis not present

## 2020-12-29 DIAGNOSIS — S022XXB Fracture of nasal bones, initial encounter for open fracture: Secondary | ICD-10-CM | POA: Diagnosis not present

## 2020-12-29 DIAGNOSIS — T1490XA Injury, unspecified, initial encounter: Secondary | ICD-10-CM | POA: Diagnosis not present

## 2020-12-29 DIAGNOSIS — R58 Hemorrhage, not elsewhere classified: Secondary | ICD-10-CM | POA: Diagnosis not present

## 2020-12-29 DIAGNOSIS — R0902 Hypoxemia: Secondary | ICD-10-CM | POA: Diagnosis not present

## 2020-12-29 DIAGNOSIS — W19XXXA Unspecified fall, initial encounter: Secondary | ICD-10-CM | POA: Diagnosis not present

## 2020-12-29 DIAGNOSIS — Y999 Unspecified external cause status: Secondary | ICD-10-CM | POA: Diagnosis not present

## 2020-12-29 DIAGNOSIS — Z743 Need for continuous supervision: Secondary | ICD-10-CM | POA: Diagnosis not present

## 2020-12-29 DIAGNOSIS — S199XXA Unspecified injury of neck, initial encounter: Secondary | ICD-10-CM | POA: Diagnosis not present

## 2020-12-29 DIAGNOSIS — R609 Edema, unspecified: Secondary | ICD-10-CM | POA: Diagnosis not present

## 2020-12-29 DIAGNOSIS — J9811 Atelectasis: Secondary | ICD-10-CM | POA: Diagnosis not present

## 2020-12-29 DIAGNOSIS — S0292XA Unspecified fracture of facial bones, initial encounter for closed fracture: Secondary | ICD-10-CM | POA: Diagnosis not present

## 2020-12-29 DIAGNOSIS — S022XXA Fracture of nasal bones, initial encounter for closed fracture: Secondary | ICD-10-CM | POA: Diagnosis not present

## 2020-12-29 DIAGNOSIS — R110 Nausea: Secondary | ICD-10-CM | POA: Diagnosis not present

## 2020-12-29 DIAGNOSIS — M5030 Other cervical disc degeneration, unspecified cervical region: Secondary | ICD-10-CM | POA: Diagnosis not present

## 2020-12-29 DIAGNOSIS — S0121XA Laceration without foreign body of nose, initial encounter: Secondary | ICD-10-CM | POA: Diagnosis not present

## 2020-12-29 DIAGNOSIS — I10 Essential (primary) hypertension: Secondary | ICD-10-CM | POA: Diagnosis not present

## 2020-12-30 ENCOUNTER — Ambulatory Visit (HOSPITAL_BASED_OUTPATIENT_CLINIC_OR_DEPARTMENT_OTHER): Admission: RE | Admit: 2020-12-30 | Payer: Medicare Other | Source: Home / Self Care | Admitting: Orthopedic Surgery

## 2020-12-30 DIAGNOSIS — J9811 Atelectasis: Secondary | ICD-10-CM | POA: Diagnosis not present

## 2020-12-30 HISTORY — DX: Type 2 diabetes mellitus without complications: E11.9

## 2020-12-30 HISTORY — DX: Depression, unspecified: F32.A

## 2020-12-30 HISTORY — DX: Gastro-esophageal reflux disease without esophagitis: K21.9

## 2020-12-30 HISTORY — DX: Unspecified osteoarthritis, unspecified site: M19.90

## 2020-12-30 HISTORY — DX: Essential (primary) hypertension: I10

## 2020-12-30 HISTORY — DX: Acute embolism and thrombosis of unspecified deep veins of unspecified lower extremity: I82.409

## 2020-12-30 HISTORY — DX: Other pulmonary embolism without acute cor pulmonale: I26.99

## 2020-12-30 HISTORY — DX: Anxiety disorder, unspecified: F41.9

## 2020-12-30 SURGERY — RELEASE, A1 PULLEY, FOR TRIGGER FINGER
Anesthesia: Choice | Laterality: Right

## 2021-01-07 DIAGNOSIS — J352 Hypertrophy of adenoids: Secondary | ICD-10-CM | POA: Diagnosis not present

## 2021-01-07 DIAGNOSIS — S022XXA Fracture of nasal bones, initial encounter for closed fracture: Secondary | ICD-10-CM | POA: Insufficient documentation

## 2021-01-07 DIAGNOSIS — Z7901 Long term (current) use of anticoagulants: Secondary | ICD-10-CM | POA: Diagnosis not present

## 2021-01-07 DIAGNOSIS — Z7984 Long term (current) use of oral hypoglycemic drugs: Secondary | ICD-10-CM | POA: Diagnosis not present

## 2021-01-07 DIAGNOSIS — E119 Type 2 diabetes mellitus without complications: Secondary | ICD-10-CM | POA: Diagnosis not present

## 2021-01-08 DIAGNOSIS — Z7984 Long term (current) use of oral hypoglycemic drugs: Secondary | ICD-10-CM | POA: Diagnosis not present

## 2021-01-08 DIAGNOSIS — E119 Type 2 diabetes mellitus without complications: Secondary | ICD-10-CM | POA: Diagnosis not present

## 2021-01-08 DIAGNOSIS — Z7901 Long term (current) use of anticoagulants: Secondary | ICD-10-CM | POA: Diagnosis not present

## 2021-01-08 DIAGNOSIS — S022XXA Fracture of nasal bones, initial encounter for closed fracture: Secondary | ICD-10-CM | POA: Diagnosis not present

## 2021-01-22 ENCOUNTER — Ambulatory Visit: Payer: Medicare Other | Admitting: Podiatry

## 2021-01-22 DIAGNOSIS — F419 Anxiety disorder, unspecified: Secondary | ICD-10-CM | POA: Diagnosis not present

## 2021-01-22 DIAGNOSIS — J019 Acute sinusitis, unspecified: Secondary | ICD-10-CM | POA: Diagnosis not present

## 2021-01-22 DIAGNOSIS — E785 Hyperlipidemia, unspecified: Secondary | ICD-10-CM | POA: Diagnosis not present

## 2021-01-22 DIAGNOSIS — E538 Deficiency of other specified B group vitamins: Secondary | ICD-10-CM | POA: Diagnosis not present

## 2021-01-22 DIAGNOSIS — D649 Anemia, unspecified: Secondary | ICD-10-CM | POA: Diagnosis not present

## 2021-01-22 DIAGNOSIS — S0993XA Unspecified injury of face, initial encounter: Secondary | ICD-10-CM | POA: Diagnosis not present

## 2021-01-22 DIAGNOSIS — Z09 Encounter for follow-up examination after completed treatment for conditions other than malignant neoplasm: Secondary | ICD-10-CM | POA: Diagnosis not present

## 2021-01-22 DIAGNOSIS — M159 Polyosteoarthritis, unspecified: Secondary | ICD-10-CM | POA: Diagnosis not present

## 2021-01-26 ENCOUNTER — Ambulatory Visit: Payer: Medicare Other | Admitting: Podiatry

## 2021-02-02 DIAGNOSIS — D649 Anemia, unspecified: Secondary | ICD-10-CM | POA: Diagnosis not present

## 2021-02-02 DIAGNOSIS — E538 Deficiency of other specified B group vitamins: Secondary | ICD-10-CM | POA: Diagnosis not present

## 2021-02-03 DIAGNOSIS — E119 Type 2 diabetes mellitus without complications: Secondary | ICD-10-CM | POA: Diagnosis not present

## 2021-02-03 DIAGNOSIS — I10 Essential (primary) hypertension: Secondary | ICD-10-CM | POA: Diagnosis not present

## 2021-02-03 DIAGNOSIS — E785 Hyperlipidemia, unspecified: Secondary | ICD-10-CM | POA: Diagnosis not present

## 2021-02-17 DIAGNOSIS — M65341 Trigger finger, right ring finger: Secondary | ICD-10-CM | POA: Diagnosis not present

## 2021-03-19 DIAGNOSIS — H353221 Exudative age-related macular degeneration, left eye, with active choroidal neovascularization: Secondary | ICD-10-CM | POA: Diagnosis not present

## 2021-03-26 DIAGNOSIS — F419 Anxiety disorder, unspecified: Secondary | ICD-10-CM | POA: Diagnosis not present

## 2021-03-26 DIAGNOSIS — M159 Polyosteoarthritis, unspecified: Secondary | ICD-10-CM | POA: Diagnosis not present

## 2021-03-26 DIAGNOSIS — Z6833 Body mass index (BMI) 33.0-33.9, adult: Secondary | ICD-10-CM | POA: Diagnosis not present

## 2021-03-26 DIAGNOSIS — K047 Periapical abscess without sinus: Secondary | ICD-10-CM | POA: Diagnosis not present

## 2021-03-26 DIAGNOSIS — I10 Essential (primary) hypertension: Secondary | ICD-10-CM | POA: Diagnosis not present

## 2021-03-30 DIAGNOSIS — D649 Anemia, unspecified: Secondary | ICD-10-CM | POA: Diagnosis not present

## 2021-03-31 DIAGNOSIS — S022XXS Fracture of nasal bones, sequela: Secondary | ICD-10-CM | POA: Diagnosis not present

## 2021-03-31 DIAGNOSIS — K029 Dental caries, unspecified: Secondary | ICD-10-CM | POA: Diagnosis not present

## 2021-03-31 DIAGNOSIS — Z9181 History of falling: Secondary | ICD-10-CM | POA: Diagnosis not present

## 2021-03-31 DIAGNOSIS — J342 Deviated nasal septum: Secondary | ICD-10-CM | POA: Diagnosis not present

## 2021-03-31 DIAGNOSIS — S0993XS Unspecified injury of face, sequela: Secondary | ICD-10-CM | POA: Diagnosis not present

## 2021-04-01 DIAGNOSIS — M65351 Trigger finger, right little finger: Secondary | ICD-10-CM | POA: Diagnosis not present

## 2021-04-01 DIAGNOSIS — M65341 Trigger finger, right ring finger: Secondary | ICD-10-CM | POA: Diagnosis not present

## 2021-04-02 DIAGNOSIS — D649 Anemia, unspecified: Secondary | ICD-10-CM | POA: Diagnosis not present

## 2021-04-03 DIAGNOSIS — E1122 Type 2 diabetes mellitus with diabetic chronic kidney disease: Secondary | ICD-10-CM | POA: Diagnosis not present

## 2021-04-03 DIAGNOSIS — E785 Hyperlipidemia, unspecified: Secondary | ICD-10-CM | POA: Diagnosis not present

## 2021-04-03 DIAGNOSIS — I10 Essential (primary) hypertension: Secondary | ICD-10-CM | POA: Diagnosis not present

## 2021-04-16 DIAGNOSIS — H353221 Exudative age-related macular degeneration, left eye, with active choroidal neovascularization: Secondary | ICD-10-CM | POA: Diagnosis not present

## 2021-04-29 DIAGNOSIS — H524 Presbyopia: Secondary | ICD-10-CM | POA: Diagnosis not present

## 2021-05-14 DIAGNOSIS — H353221 Exudative age-related macular degeneration, left eye, with active choroidal neovascularization: Secondary | ICD-10-CM | POA: Diagnosis not present

## 2021-06-11 DIAGNOSIS — H353211 Exudative age-related macular degeneration, right eye, with active choroidal neovascularization: Secondary | ICD-10-CM | POA: Diagnosis not present

## 2021-06-15 DIAGNOSIS — E669 Obesity, unspecified: Secondary | ICD-10-CM | POA: Diagnosis not present

## 2021-06-15 DIAGNOSIS — K047 Periapical abscess without sinus: Secondary | ICD-10-CM | POA: Diagnosis not present

## 2021-06-15 DIAGNOSIS — I10 Essential (primary) hypertension: Secondary | ICD-10-CM | POA: Diagnosis not present

## 2021-06-15 DIAGNOSIS — Z6833 Body mass index (BMI) 33.0-33.9, adult: Secondary | ICD-10-CM | POA: Diagnosis not present

## 2021-06-25 DIAGNOSIS — Z139 Encounter for screening, unspecified: Secondary | ICD-10-CM | POA: Diagnosis not present

## 2021-06-25 DIAGNOSIS — E538 Deficiency of other specified B group vitamins: Secondary | ICD-10-CM | POA: Diagnosis not present

## 2021-06-25 DIAGNOSIS — N343 Urethral syndrome, unspecified: Secondary | ICD-10-CM | POA: Diagnosis not present

## 2021-06-25 DIAGNOSIS — E1140 Type 2 diabetes mellitus with diabetic neuropathy, unspecified: Secondary | ICD-10-CM | POA: Diagnosis not present

## 2021-06-25 DIAGNOSIS — F419 Anxiety disorder, unspecified: Secondary | ICD-10-CM | POA: Diagnosis not present

## 2021-06-25 DIAGNOSIS — J45909 Unspecified asthma, uncomplicated: Secondary | ICD-10-CM | POA: Diagnosis not present

## 2021-06-25 DIAGNOSIS — K219 Gastro-esophageal reflux disease without esophagitis: Secondary | ICD-10-CM | POA: Diagnosis not present

## 2021-06-25 DIAGNOSIS — I10 Essential (primary) hypertension: Secondary | ICD-10-CM | POA: Diagnosis not present

## 2021-06-25 DIAGNOSIS — E669 Obesity, unspecified: Secondary | ICD-10-CM | POA: Diagnosis not present

## 2021-06-25 DIAGNOSIS — Z9181 History of falling: Secondary | ICD-10-CM | POA: Diagnosis not present

## 2021-06-25 DIAGNOSIS — Z6832 Body mass index (BMI) 32.0-32.9, adult: Secondary | ICD-10-CM | POA: Diagnosis not present

## 2021-06-25 DIAGNOSIS — Z1331 Encounter for screening for depression: Secondary | ICD-10-CM | POA: Diagnosis not present

## 2021-07-09 DIAGNOSIS — N898 Other specified noninflammatory disorders of vagina: Secondary | ICD-10-CM | POA: Diagnosis not present

## 2021-07-09 DIAGNOSIS — N3281 Overactive bladder: Secondary | ICD-10-CM | POA: Diagnosis not present

## 2021-07-09 DIAGNOSIS — R3129 Other microscopic hematuria: Secondary | ICD-10-CM | POA: Diagnosis not present

## 2021-07-09 DIAGNOSIS — M7989 Other specified soft tissue disorders: Secondary | ICD-10-CM | POA: Diagnosis not present

## 2021-07-09 DIAGNOSIS — N343 Urethral syndrome, unspecified: Secondary | ICD-10-CM | POA: Diagnosis not present

## 2021-07-09 DIAGNOSIS — E1140 Type 2 diabetes mellitus with diabetic neuropathy, unspecified: Secondary | ICD-10-CM | POA: Diagnosis not present

## 2021-07-09 DIAGNOSIS — Z6831 Body mass index (BMI) 31.0-31.9, adult: Secondary | ICD-10-CM | POA: Diagnosis not present

## 2021-07-23 DIAGNOSIS — H353231 Exudative age-related macular degeneration, bilateral, with active choroidal neovascularization: Secondary | ICD-10-CM | POA: Diagnosis not present

## 2021-07-28 DIAGNOSIS — Z6831 Body mass index (BMI) 31.0-31.9, adult: Secondary | ICD-10-CM | POA: Diagnosis not present

## 2021-07-28 DIAGNOSIS — E785 Hyperlipidemia, unspecified: Secondary | ICD-10-CM | POA: Diagnosis not present

## 2021-07-28 DIAGNOSIS — Z79899 Other long term (current) drug therapy: Secondary | ICD-10-CM | POA: Diagnosis not present

## 2021-07-28 DIAGNOSIS — E538 Deficiency of other specified B group vitamins: Secondary | ICD-10-CM | POA: Diagnosis not present

## 2021-07-28 DIAGNOSIS — F419 Anxiety disorder, unspecified: Secondary | ICD-10-CM | POA: Diagnosis not present

## 2021-07-28 DIAGNOSIS — M159 Polyosteoarthritis, unspecified: Secondary | ICD-10-CM | POA: Diagnosis not present

## 2021-07-28 DIAGNOSIS — E669 Obesity, unspecified: Secondary | ICD-10-CM | POA: Diagnosis not present

## 2021-07-28 DIAGNOSIS — E559 Vitamin D deficiency, unspecified: Secondary | ICD-10-CM | POA: Diagnosis not present

## 2021-07-28 DIAGNOSIS — R002 Palpitations: Secondary | ICD-10-CM | POA: Diagnosis not present

## 2021-08-13 DIAGNOSIS — D649 Anemia, unspecified: Secondary | ICD-10-CM | POA: Diagnosis not present

## 2021-08-13 DIAGNOSIS — N289 Disorder of kidney and ureter, unspecified: Secondary | ICD-10-CM | POA: Diagnosis not present

## 2021-08-20 DIAGNOSIS — H353231 Exudative age-related macular degeneration, bilateral, with active choroidal neovascularization: Secondary | ICD-10-CM | POA: Diagnosis not present

## 2021-09-03 DIAGNOSIS — D649 Anemia, unspecified: Secondary | ICD-10-CM | POA: Diagnosis not present

## 2021-09-08 DIAGNOSIS — G8929 Other chronic pain: Secondary | ICD-10-CM | POA: Diagnosis not present

## 2021-09-08 DIAGNOSIS — E538 Deficiency of other specified B group vitamins: Secondary | ICD-10-CM | POA: Diagnosis not present

## 2021-09-08 DIAGNOSIS — E559 Vitamin D deficiency, unspecified: Secondary | ICD-10-CM | POA: Diagnosis not present

## 2021-09-08 DIAGNOSIS — F419 Anxiety disorder, unspecified: Secondary | ICD-10-CM | POA: Diagnosis not present

## 2021-09-11 DIAGNOSIS — K2101 Gastro-esophageal reflux disease with esophagitis, with bleeding: Secondary | ICD-10-CM | POA: Diagnosis not present

## 2021-09-11 DIAGNOSIS — K648 Other hemorrhoids: Secondary | ICD-10-CM | POA: Diagnosis not present

## 2021-09-11 DIAGNOSIS — D126 Benign neoplasm of colon, unspecified: Secondary | ICD-10-CM | POA: Diagnosis not present

## 2021-09-11 DIAGNOSIS — D120 Benign neoplasm of cecum: Secondary | ICD-10-CM | POA: Diagnosis not present

## 2021-09-11 DIAGNOSIS — E119 Type 2 diabetes mellitus without complications: Secondary | ICD-10-CM | POA: Diagnosis not present

## 2021-09-11 DIAGNOSIS — I10 Essential (primary) hypertension: Secondary | ICD-10-CM | POA: Diagnosis not present

## 2021-09-11 DIAGNOSIS — Z86010 Personal history of colonic polyps: Secondary | ICD-10-CM | POA: Diagnosis not present

## 2021-09-11 DIAGNOSIS — D509 Iron deficiency anemia, unspecified: Secondary | ICD-10-CM | POA: Diagnosis not present

## 2021-09-11 DIAGNOSIS — K449 Diaphragmatic hernia without obstruction or gangrene: Secondary | ICD-10-CM | POA: Diagnosis not present

## 2021-09-11 DIAGNOSIS — K2210 Ulcer of esophagus without bleeding: Secondary | ICD-10-CM | POA: Diagnosis not present

## 2021-09-11 DIAGNOSIS — D649 Anemia, unspecified: Secondary | ICD-10-CM | POA: Diagnosis not present

## 2021-09-11 DIAGNOSIS — K635 Polyp of colon: Secondary | ICD-10-CM | POA: Diagnosis not present

## 2021-09-11 DIAGNOSIS — K644 Residual hemorrhoidal skin tags: Secondary | ICD-10-CM | POA: Diagnosis not present

## 2021-09-11 DIAGNOSIS — K219 Gastro-esophageal reflux disease without esophagitis: Secondary | ICD-10-CM | POA: Diagnosis not present

## 2021-09-14 DIAGNOSIS — J069 Acute upper respiratory infection, unspecified: Secondary | ICD-10-CM | POA: Diagnosis not present

## 2021-09-24 DIAGNOSIS — H353231 Exudative age-related macular degeneration, bilateral, with active choroidal neovascularization: Secondary | ICD-10-CM | POA: Diagnosis not present

## 2021-10-08 DIAGNOSIS — M7989 Other specified soft tissue disorders: Secondary | ICD-10-CM | POA: Diagnosis not present

## 2021-10-08 DIAGNOSIS — Z6831 Body mass index (BMI) 31.0-31.9, adult: Secondary | ICD-10-CM | POA: Diagnosis not present

## 2021-10-08 DIAGNOSIS — K219 Gastro-esophageal reflux disease without esophagitis: Secondary | ICD-10-CM | POA: Diagnosis not present

## 2021-10-08 DIAGNOSIS — I10 Essential (primary) hypertension: Secondary | ICD-10-CM | POA: Diagnosis not present

## 2021-10-08 DIAGNOSIS — E669 Obesity, unspecified: Secondary | ICD-10-CM | POA: Diagnosis not present

## 2021-10-08 DIAGNOSIS — J45909 Unspecified asthma, uncomplicated: Secondary | ICD-10-CM | POA: Diagnosis not present

## 2021-10-08 DIAGNOSIS — F419 Anxiety disorder, unspecified: Secondary | ICD-10-CM | POA: Diagnosis not present

## 2021-11-10 DIAGNOSIS — E538 Deficiency of other specified B group vitamins: Secondary | ICD-10-CM | POA: Diagnosis not present

## 2021-11-10 DIAGNOSIS — Z6831 Body mass index (BMI) 31.0-31.9, adult: Secondary | ICD-10-CM | POA: Diagnosis not present

## 2021-11-10 DIAGNOSIS — M159 Polyosteoarthritis, unspecified: Secondary | ICD-10-CM | POA: Diagnosis not present

## 2021-11-10 DIAGNOSIS — E1169 Type 2 diabetes mellitus with other specified complication: Secondary | ICD-10-CM | POA: Diagnosis not present

## 2021-11-10 DIAGNOSIS — E785 Hyperlipidemia, unspecified: Secondary | ICD-10-CM | POA: Diagnosis not present

## 2021-11-10 DIAGNOSIS — D649 Anemia, unspecified: Secondary | ICD-10-CM | POA: Diagnosis not present

## 2021-11-10 DIAGNOSIS — R002 Palpitations: Secondary | ICD-10-CM | POA: Diagnosis not present

## 2021-11-12 DIAGNOSIS — H353231 Exudative age-related macular degeneration, bilateral, with active choroidal neovascularization: Secondary | ICD-10-CM | POA: Diagnosis not present

## 2021-11-23 ENCOUNTER — Encounter: Payer: Self-pay | Admitting: *Deleted

## 2021-11-23 ENCOUNTER — Encounter: Payer: Self-pay | Admitting: Cardiology

## 2021-11-23 DIAGNOSIS — E119 Type 2 diabetes mellitus without complications: Secondary | ICD-10-CM | POA: Insufficient documentation

## 2021-11-23 DIAGNOSIS — F32A Depression, unspecified: Secondary | ICD-10-CM | POA: Insufficient documentation

## 2021-11-23 DIAGNOSIS — K219 Gastro-esophageal reflux disease without esophagitis: Secondary | ICD-10-CM | POA: Insufficient documentation

## 2021-11-23 DIAGNOSIS — I82409 Acute embolism and thrombosis of unspecified deep veins of unspecified lower extremity: Secondary | ICD-10-CM | POA: Insufficient documentation

## 2021-11-23 DIAGNOSIS — R590 Localized enlarged lymph nodes: Secondary | ICD-10-CM | POA: Insufficient documentation

## 2021-11-23 DIAGNOSIS — M1990 Unspecified osteoarthritis, unspecified site: Secondary | ICD-10-CM | POA: Insufficient documentation

## 2021-11-23 DIAGNOSIS — F419 Anxiety disorder, unspecified: Secondary | ICD-10-CM | POA: Insufficient documentation

## 2021-11-24 ENCOUNTER — Ambulatory Visit: Payer: Medicare Other | Attending: Cardiology | Admitting: Cardiology

## 2021-11-24 ENCOUNTER — Encounter: Payer: Self-pay | Admitting: Cardiology

## 2021-11-24 ENCOUNTER — Ambulatory Visit: Payer: Medicare Other | Attending: Cardiology

## 2021-11-24 VITALS — BP 134/68 | HR 77 | Ht 65.0 in | Wt 192.4 lb

## 2021-11-24 DIAGNOSIS — E119 Type 2 diabetes mellitus without complications: Secondary | ICD-10-CM | POA: Insufficient documentation

## 2021-11-24 DIAGNOSIS — I10 Essential (primary) hypertension: Secondary | ICD-10-CM | POA: Insufficient documentation

## 2021-11-24 DIAGNOSIS — Z7901 Long term (current) use of anticoagulants: Secondary | ICD-10-CM | POA: Insufficient documentation

## 2021-11-24 DIAGNOSIS — R002 Palpitations: Secondary | ICD-10-CM | POA: Insufficient documentation

## 2021-11-24 DIAGNOSIS — E785 Hyperlipidemia, unspecified: Secondary | ICD-10-CM

## 2021-11-24 NOTE — Progress Notes (Signed)
Cardiology Office Note:    Date:  11/24/2021   ID:  [name], DOB November 05, 1945, MRN [id number]  PCP:  Nicholos Johns, MD  Cardiologist:  Shirlee More, MD   Referring MD: Nicholos Johns, MD  ASSESSMENT:    1. Palpitations   2. Chronic anticoagulation   3. Essential hypertension   4. Hyperlipidemia, unspecified hyperlipidemia type   5. Diabetes mellitus without complication ([place])    PLAN:    In order of problems listed above:  We will utilize 2-week monitor to try to catch these episodes document her arrhythmia and come up with a treatment plan.  I suspect she is having intermittent atrial tachyarrhythmia.  Also check echocardiogram for right heart function pulmonary hypertension with pulmonary embolism and left heart systolic and diastolic function with her shortness of breath. Continue anticoagulation Continue with current antihypertensive loop diuretic losartan Continue her statin she has severe dyslipidemia baseline Well-controlled A1c at target  Next appointment 3 months   Medication Adjustments/Labs and Tests Ordered: Current medicines are reviewed at length with the patient today.  Concerns regarding medicines are outlined above.  Orders Placed This Encounter  Procedures   LONG TERM MONITOR (3-14 DAYS)   EKG 12-Lead   ECHOCARDIOGRAM COMPLETE   No orders of the defined types were placed in this encounter.   Chief complaint rapid heart rate  History of Present Illness:    [name] is a 76 y.o. female who is being seen today for the evaluation of palpitation at the request of Nicholos Johns, MD.  Recent labs from PCP showed a creatinine 1.60 GFR 34 cc potassium 4.6 her hemoglobin was diminished at 10.9 her MCV was normal platelet count was normal at 337,000.  She had an echocardiogram in 2019 showing normal left ventricular size mild LVH normal ejection fraction 55 to 60% and impaired relaxation there is minimal valvular heart disease trace to mild mitral  regurgitation.  CT angiography of the chest 03/02/2017 showed acute pulmonary embolism.  She is seen by me because of episodes several times a week particular in the evening when she is fatigued where her heart will race quite rapidly for up to 10 minutes and makes her feel very weak little short of breath but she has never captured an episode for heart rate.  She has no known history of heart disease congenital rheumatic or previous arrhythmia She struggles because the consequence of pulmonary embolism with exertional chest pain more than usual activities she has edema in the left leg but no orthopnea exertional chest pain claudication or syncope. She tolerates her anticoagulant without bleeding She complains bitterly of chronic back pain and severe indigestion.  Past Medical History:  Diagnosis Date   Anxiety    Arthritis    Chronic cough    Sinus CT ned 03-19-10; allergy eval neg 03-19-2010>>>IgE 38 pos only for grass; Singulair tiral 03-19-2010   Depression    Diabetes mellitus without complication (HCC)    DVT (deep venous thrombosis) (HCC)    GERD (gastroesophageal reflux disease)    Hilar adenopathy    CT 02-24-2010   Hyperlipidemia    Hypertension    Other and unspecified hyperlipidemia    Other pulmonary embolism and infarction    Pneumonia, organism unspecified(486)    Pulmonary embolism (HCC)    Unspecified asthma(493.90)    Vitamin B12 deficiency     Past Surgical History:  Procedure Laterality Date   CATARACT EXTRACTION Right    KNEE ARTHROSCOPY     bilateral  left knee replacement  01/05/2008   left shoulder  01/04/1993   NASAL SINUS SURGERY  age 82   TONSILLECTOMY  53   TUBAL LIGATION  age 40    Current Medications: Current Meds  Medication Sig   apixaban (ELIQUIS) 5 MG TABS tablet Take 5 mg by mouth daily.   budesonide-formoterol (SYMBICORT) 160-4.5 MCG/ACT inhaler Inhale 2 puffs into the lungs 2 (two) times daily.   carvedilol (COREG) 3.125 MG tablet  Take 3.125 mg by mouth 2 (two) times daily with a meal.   Cetirizine HCl (ZYRTEC PO) Take 1 tablet by mouth daily.   citalopram (CELEXA) 20 MG tablet Take 20 mg by mouth daily.   cyanocobalamin (VITAMIN B12) 1000 MCG tablet Take 1,000 mcg by mouth every 30 (thirty) days.   cyclobenzaprine (FLEXERIL) 10 MG tablet Take 10 mg by mouth 2 (two) times daily as needed for muscle spasms.   Diclofenac Sodium 3 % GEL Apply 1 Application topically daily as needed (pain).   ergocalciferol (VITAMIN D2) 1.25 MG (50000 UT) capsule Take 50,000 Units by mouth once a week.   fenofibrate micronized (LOFIBRA) 134 MG capsule Take 134 mg by mouth daily before breakfast.   fluticasone (FLONASE) 50 MCG/ACT nasal spray Place 2 sprays into both nostrils daily.   furosemide (LASIX) 40 MG tablet Take 40 mg by mouth 2 (two) times daily.   losartan (COZAAR) 50 MG tablet Take 50 mg by mouth daily.   metFORMIN (GLUCOPHAGE) 500 MG tablet Take 500 mg by mouth daily with breakfast. And 250 mg every evening   pantoprazole (PROTONIX) 20 MG tablet Take 20 mg by mouth daily.   ranitidine (ZANTAC) 300 MG capsule Take 300 mg by mouth every evening.   rosuvastatin (CRESTOR) 5 MG tablet Take 2.5 mg by mouth 2 (two) times a week.   traMADol (ULTRAM) 50 MG tablet Take 50 mg by mouth 2 (two) times daily as needed for moderate pain.     Allergies:   Gabapentin and Prednisone   Social History   Socioeconomic History   Marital status: Married    Spouse name: Not on file   Number of children: Not on file   Years of education: Not on file   Highest education level: Not on file  Occupational History   Occupation: retired     Comment: furniture company  Tobacco Use   Smoking status: Never   Smokeless tobacco: Never  Substance and Sexual Activity   Alcohol use: No   Drug use: Never   Sexual activity: Not on file  Other Topics Concern   Not on file  Social History Narrative   Not on file   Social Determinants of Health    Financial Resource Strain: Not on file  Food Insecurity: Not on file  Transportation Needs: Not on file  Physical Activity: Not on file  Stress: Not on file  Social Connections: Not on file     Family History: The patient's family history includes Breast cancer in her cousin; Cancer in her brother; Colon cancer in her cousin; Heart disease in her mother; Hyperlipidemia in her mother; Hypertension in her mother; Rheum arthritis in her mother.  ROS:   ROS Please see the history of present illness.     All other systems reviewed and are negative.  EKGs/Labs/Other Studies Reviewed:    The following studies were reviewed today:   EKG:  EKG is  ordered today.  The ekg ordered today is personally reviewed and demonstrates sinus rhythm and  is normal  Recent Labs: 07/29/2021 cholesterol 252 LDL 185 A1c 6.0 hemoglobin 13.9 creatinine 1.3 potassium 4.5  Physical Exam:    VS:  BP 134/68 (BP Location: Right Arm, Patient Position: Sitting)   Pulse 77   Ht '5\' 5"'$  (1.651 m)   Wt 192 lb 6.4 oz (87.3 kg)   SpO2 98%   BMI 32.02 kg/m     Wt Readings from Last 3 Encounters:  11/24/21 192 lb 6.4 oz (87.3 kg)  07/28/21 191 lb (86.6 kg)  08/10/18 225 lb 3.2 oz (102.2 kg)     GEN:  Well nourished, well developed in no acute distress HEENT: Normal NECK: No JVD; No carotid bruits LYMPHATICS: No lymphadenopathy CARDIAC: RRR, no murmurs, rubs, gallops RESPIRATORY:  Clear to auscultation without rales, wheezing or rhonchi  ABDOMEN: Soft, non-tender, non-distended MUSCULOSKELETAL:  No edema; No deformity  SKIN: Warm and dry NEUROLOGIC:  Alert and oriented x 3 PSYCHIATRIC:  Normal affect     Signed, Shirlee More, MD  11/24/2021 4:19 PM    [facility]

## 2021-11-24 NOTE — Addendum Note (Signed)
Addended by: Edwyna Shell I on: 11/24/2021 04:54 PM   Modules accepted: Orders

## 2021-11-24 NOTE — Patient Instructions (Signed)
Medication Instructions:  Your physician recommends that you continue on your current medications as directed. Please refer to the Current Medication list given to you today.  *If you need a refill on your cardiac medications before your next appointment, please call your pharmacy*   Lab Work: None If you have labs (blood work) drawn today and your tests are completely normal, you will receive your results only by: [place] (if you have MyChart) OR A paper copy in the mail If you have any lab test that is abnormal or we need to change your treatment, we will call you to review the results.   Testing/Procedures: A zio monitor was ordered today. It will remain on for 14 days. You will then return monitor and event diary in provided box. It takes 1-2 weeks for report to be downloaded and returned to [place]. We will call you with the results. If monitor falls off or has orange flashing light, please call [employer]io for further instructions.   Your physician has requested that you have an echocardiogram. Echocardiography is a painless test that uses sound waves to create images of your heart. It provides your doctor with information about the size and shape of your heart and how well your heart's chambers and valves are working. This procedure takes approximately one hour. There are no restrictions for this procedure. Please do NOT wear cologne, perfume, aftershave, or lotions (deodorant is allowed). Please arrive 15 minutes prior to your appointment time.    Follow-Up: At [facility], you and your health needs are our priority.  As part of our continuing mission to provide you with exceptional heart care, we have created designated Provider Care Teams.  These Care Teams include your primary Cardiologist (physician) and Advanced Practice Providers (APPs -  Physician Assistants and Nurse Practitioners) who all work together to provide you with the care you need, when you need it.  We  recommend signing up for the patient portal called "MyChart".  Sign up information is provided on this After Visit Summary.  MyChart is used to connect with patients for Virtual Visits (Telemedicine).  Patients are able to view lab/test results, encounter notes, upcoming appointments, etc.  Non-urgent messages can be sent to your provider as well.   To learn more about what you can do with MyChart, go to [email or web address].    Your next appointment:   8 week(s)  The format for your next appointment:   In Person  Provider:   Shirlee More, MD    Other Instructions None  Important Information About Sugar

## 2021-12-10 ENCOUNTER — Ambulatory Visit: Payer: Medicare Other | Attending: Cardiology

## 2021-12-10 DIAGNOSIS — E785 Hyperlipidemia, unspecified: Secondary | ICD-10-CM | POA: Diagnosis not present

## 2021-12-10 DIAGNOSIS — E119 Type 2 diabetes mellitus without complications: Secondary | ICD-10-CM | POA: Diagnosis not present

## 2021-12-10 DIAGNOSIS — E538 Deficiency of other specified B group vitamins: Secondary | ICD-10-CM | POA: Diagnosis not present

## 2021-12-10 DIAGNOSIS — I10 Essential (primary) hypertension: Secondary | ICD-10-CM

## 2021-12-10 DIAGNOSIS — R002 Palpitations: Secondary | ICD-10-CM | POA: Diagnosis not present

## 2021-12-10 DIAGNOSIS — R0602 Shortness of breath: Secondary | ICD-10-CM

## 2021-12-10 DIAGNOSIS — J45909 Unspecified asthma, uncomplicated: Secondary | ICD-10-CM | POA: Diagnosis not present

## 2021-12-10 DIAGNOSIS — Z7901 Long term (current) use of anticoagulants: Secondary | ICD-10-CM

## 2021-12-10 DIAGNOSIS — G8929 Other chronic pain: Secondary | ICD-10-CM | POA: Diagnosis not present

## 2021-12-10 DIAGNOSIS — E559 Vitamin D deficiency, unspecified: Secondary | ICD-10-CM | POA: Diagnosis not present

## 2021-12-10 DIAGNOSIS — F419 Anxiety disorder, unspecified: Secondary | ICD-10-CM | POA: Diagnosis not present

## 2021-12-10 LAB — ECHOCARDIOGRAM COMPLETE
Area-P 1/2: 3.37 cm2
S' Lateral: 3.7 cm

## 2021-12-11 ENCOUNTER — Telehealth: Payer: Self-pay

## 2021-12-11 NOTE — Telephone Encounter (Signed)
-----   Message from Richardo Priest, MD sent at 12/11/2021  7:49 AM EST ----- This is a good result for cardiac echo is normal

## 2021-12-11 NOTE — Telephone Encounter (Signed)
Patient notified of results.

## 2021-12-15 DIAGNOSIS — R002 Palpitations: Secondary | ICD-10-CM | POA: Diagnosis not present

## 2021-12-15 DIAGNOSIS — I10 Essential (primary) hypertension: Secondary | ICD-10-CM | POA: Diagnosis not present

## 2021-12-21 ENCOUNTER — Telehealth: Payer: Self-pay

## 2021-12-21 MED ORDER — DILTIAZEM HCL ER COATED BEADS 120 MG PO CP24: 120.0000 mg | ORAL_CAPSULE | Freq: Every day | ORAL | 2 refills | Status: DC

## 2021-12-21 NOTE — Telephone Encounter (Signed)
Patient notified of results and recommendation and agreed with plan. Medication sent to confirmed pharmacy.

## 2021-12-21 NOTE — Telephone Encounter (Signed)
-----   Message from Richardo Priest, MD sent at 12/21/2021  7:36 AM EST ----- She is having frequent short episodes of SVT  Add Cardizem CD 180 mg daily.

## 2022-01-07 DIAGNOSIS — H353231 Exudative age-related macular degeneration, bilateral, with active choroidal neovascularization: Secondary | ICD-10-CM | POA: Diagnosis not present

## 2022-01-14 DIAGNOSIS — Z6831 Body mass index (BMI) 31.0-31.9, adult: Secondary | ICD-10-CM | POA: Diagnosis not present

## 2022-01-14 DIAGNOSIS — K219 Gastro-esophageal reflux disease without esophagitis: Secondary | ICD-10-CM | POA: Diagnosis not present

## 2022-01-14 DIAGNOSIS — F419 Anxiety disorder, unspecified: Secondary | ICD-10-CM | POA: Diagnosis not present

## 2022-01-14 DIAGNOSIS — M549 Dorsalgia, unspecified: Secondary | ICD-10-CM | POA: Diagnosis not present

## 2022-01-14 DIAGNOSIS — I10 Essential (primary) hypertension: Secondary | ICD-10-CM | POA: Diagnosis not present

## 2022-01-14 DIAGNOSIS — Z1231 Encounter for screening mammogram for malignant neoplasm of breast: Secondary | ICD-10-CM | POA: Diagnosis not present

## 2022-01-14 DIAGNOSIS — M546 Pain in thoracic spine: Secondary | ICD-10-CM | POA: Diagnosis not present

## 2022-01-18 DIAGNOSIS — E538 Deficiency of other specified B group vitamins: Secondary | ICD-10-CM | POA: Insufficient documentation

## 2022-01-18 NOTE — Progress Notes (Unsigned)
Cardiology Office Note:    Date:  01/19/2022   ID:  [name], DOB [birth year], MRN [id number]  PCP:  Nicholos Johns, MD  Cardiologist:  Shirlee More, MD    Referring MD: Nicholos Johns, MD    ASSESSMENT:    1. Palpitations   2. Chronic anticoagulation   3. Essential hypertension   4. Hyperlipidemia, unspecified hyperlipidemia type   5. Diabetes mellitus without complication ([place])   6. Costochondral chest pain    PLAN:    In order of problems listed above:  In some respects is better with the addition of calcium channel blocker Home blood pressure is ideal 130/60 and no further episodes of rapid heart rhythm with the atrial tachycardia detected on her event monitor Her concern is what is best described as chronic costochondral pain and will go ahead and order a chest x-ray.  She will take Tylenol over-the-counter she has a prescription for tramadol and cannot take nonsteroidal anti-inflammatory drugs with anticoagulant therapy Continue her chronic anticoagulation Continue her statin LDL at target Stable diabetes managed by her PCP   Next appointment: 9 months   Medication Adjustments/Labs and Tests Ordered: Current medicines are reviewed at length with the patient today.  Concerns regarding medicines are outlined above.  No orders of the defined types were placed in this encounter.  No orders of the defined types were placed in this encounter.   Chief Complaint  Patient presents with   Follow-up   Palpitations    History of Present Illness:    [name] is a 77 y.o. female with a hx of hypertension chronic anticoagulation with deep vein thrombosis hyperlipidemia and type 2 diabetes last seen 11/24/2021 for palpitation.  Compliance with diet, lifestyle and medications: Yes  Since initiating long-acting diltiazem she is on no recurrent rapid heart rhythm No shortness of breath edema palpitation or syncope She is very troubled by chronic pain that occurs in her  neck shoulders back but also has very typical positional bilateral costochondral chest pain Spine x-rays performed does not know the results she asked me to order chest x-ray she is worried she may have a problem inside of her chest and she has not worked in her  She had echocardiogram performed 12/10/2021.  Left ventricle normal size wall thickness normal systolic and diastolic function EF 55 to 60%.  Right ventricle normal size and function. Sonographer Comments: Suboptimal subcostal window and suboptimal  parasternal window. Global longitudinal strain was attempted.  IMPRESSIONS   1. Left ventricular ejection fraction, by estimation, is 55 to 60%. The  left ventricle has normal function. Left ventricular endocardial border  not optimally defined to evaluate regional wall motion. Left ventricular  diastolic parameters were normal.   2. Right ventricular systolic function is normal. The right ventricular  size is normal.   3. The mitral valve is normal in structure. No evidence of mitral valve  regurgitation. No evidence of mitral stenosis.   4. The aortic valve is tricuspid. Aortic valve regurgitation is not  visualized. No aortic stenosis is present.   An event monitor reported 12/21/2021 showing frequent episodes of brief SVT. Study Highlights Patch Wear Time:  13 days and 18 hours (2023-11-21T15:46:48-0500 to 2023-12-05T10:43:11-498) Patient had a min HR of 54 bpm, max HR of 214 bpm, and avg HR of 74 bpm. Predominant underlying rhythm was Sinus Rhythm.  There were 6 symptomatic events all sinus rhythm to associated with occasional APCs and 1 associated with a single PVC. There are no pauses  of 3 seconds or greater and no episodes of second or third-degree AV nodal block or sinus node exit block. 1 run of Ventricular Tachycardia occurred lasting 10 beats with a max rate of 214 bpm (avg 160 bpm).  119 Supraventricular Tachycardia runs occurred, the run with the fastest interval lasting  11 beats with a max rate of 176 bpm, the longest lasting 18.1 secs with an avg rate of 117 bpm. Isolated SVEs were occasional (2.8%, 38950), SVE Couplets were rare (<1.0%, 1250), and SVE Triplets  were rare (<1.0%, 412).  There were no episodes of atrial fibrillation or flutter. Isolated VEs were rare (<1.0%), VE Couplets were rare (<1.0%), and no VE Triplets were present. Ventricular Bigeminy was present.  Past Medical History:  Diagnosis Date   Anxiety    Arthritis    Chronic cough    Sinus CT ned 03-19-10; allergy eval neg 03-19-2010>>>IgE 38 pos only for grass; Singulair tiral 03-19-2010   Depression    Diabetes mellitus without complication (HCC)    DVT (deep venous thrombosis) (HCC)    GERD (gastroesophageal reflux disease)    Hilar adenopathy    CT 02-24-2010   Hyperlipidemia    Hypertension    Other and unspecified hyperlipidemia    Other pulmonary embolism and infarction    Pneumonia, organism unspecified(486)    Pulmonary embolism (HCC)    Unspecified asthma(493.90)    Vitamin B12 deficiency     Past Surgical History:  Procedure Laterality Date   CATARACT EXTRACTION Right    KNEE ARTHROSCOPY     bilateral   left knee replacement  01/05/2008   left shoulder  01/04/1993   NASAL SINUS SURGERY  age 39   TONSILLECTOMY  54   TUBAL LIGATION  age 76    Current Medications: Current Meds  Medication Sig   apixaban (ELIQUIS) 5 MG TABS tablet Take 5 mg by mouth daily.   budesonide-formoterol (SYMBICORT) 160-4.5 MCG/ACT inhaler Inhale 2 puffs into the lungs 2 (two) times daily.   carvedilol (COREG) 3.125 MG tablet Take 3.125 mg by mouth 2 (two) times daily with a meal.   Cetirizine HCl (ZYRTEC PO) Take 1 tablet by mouth daily.   citalopram (CELEXA) 20 MG tablet Take 20 mg by mouth daily.   cyanocobalamin (VITAMIN B12) 1000 MCG tablet Take 1,000 mcg by mouth every 30 (thirty) days.   cyclobenzaprine (FLEXERIL) 10 MG tablet Take 10 mg by mouth 2 (two) times daily as needed for  muscle spasms.   Diclofenac Sodium 3 % GEL Apply 1 Application topically daily as needed (pain).   diltiazem (CARDIZEM CD) 120 MG 24 hr capsule Take 1 capsule (120 mg total) by mouth daily.   ergocalciferol (VITAMIN D2) 1.25 MG (50000 UT) capsule Take 50,000 Units by mouth once a week.   fenofibrate micronized (LOFIBRA) 134 MG capsule Take 134 mg by mouth daily before breakfast.   fluticasone (FLONASE) 50 MCG/ACT nasal spray Place 2 sprays into both nostrils daily.   furosemide (LASIX) 40 MG tablet Take 40 mg by mouth 2 (two) times daily.   LORazepam (ATIVAN) 0.5 MG tablet Take 0.75 mg by mouth at bedtime.   losartan (COZAAR) 50 MG tablet Take 50 mg by mouth daily.   metFORMIN (GLUCOPHAGE) 500 MG tablet Take 500 mg by mouth daily with breakfast. And 250 mg every evening   montelukast (SINGULAIR) 10 MG tablet Take 10 mg by mouth at bedtime.   pantoprazole (PROTONIX) 20 MG tablet Take 20 mg by mouth daily.  Potassium Chloride CR (MICRO-K) 8 MEQ CPCR capsule CR Take 8 mEq by mouth as needed (leg cramps).   rosuvastatin (CRESTOR) 5 MG tablet Take 2.5 mg by mouth 2 (two) times a week.   traMADol (ULTRAM) 50 MG tablet Take 50 mg by mouth 2 (two) times daily as needed for moderate pain.     Allergies:   Gabapentin and Prednisone   Social History   Socioeconomic History   Marital status: Married    Spouse name: Not on file   Number of children: Not on file   Years of education: Not on file   Highest education level: Not on file  Occupational History   Occupation: retired     Comment: furniture company  Tobacco Use   Smoking status: Never   Smokeless tobacco: Never  Substance and Sexual Activity   Alcohol use: No   Drug use: Never   Sexual activity: Not on file  Other Topics Concern   Not on file  Social History Narrative   Not on file   Social Determinants of Health   Financial Resource Strain: Not on file  Food Insecurity: Not on file  Transportation Needs: Not on file   Physical Activity: Not on file  Stress: Not on file  Social Connections: Not on file     Family History: The patient's family history includes Breast cancer in her cousin; Cancer in her brother; Colon cancer in her cousin; Heart disease in her mother; Hyperlipidemia in her mother; Hypertension in her mother; Rheum arthritis in her mother. ROS:   Please see the history of present illness.    All other systems reviewed and are negative.  EKGs/Labs/Other Studies Reviewed:    The following studies were reviewed today:    Recent Labs: 11/10/2021 cholesterol 152 LDL 80 A1c 6.2% hemoglobin 11.3 creatinine 1.6 potassium 4.6  Physical Exam:    VS:  BP 110/60 (BP Location: Left Arm, Patient Position: Sitting, Cuff Size: Normal)   Pulse 76   Ht '5\' 5"'$  (1.651 m)   Wt 194 lb (88 kg)   SpO2 97%   BMI 32.28 kg/m     Wt Readings from Last 3 Encounters:  01/19/22 194 lb (88 kg)  11/24/21 192 lb 6.4 oz (87.3 kg)  07/28/21 191 lb (86.6 kg)     GEN: She looks frail well nourished, well developed in no acute distress She has a kyphosis and scoliosis He is diffusely tender along the lower costochondral and rib margins reproducing her complaint HEENT: Normal NECK: No JVD; No carotid bruits LYMPHATICS: No lymphadenopathy CARDIAC: RRR, no murmurs, rubs, gallops RESPIRATORY:  Clear to auscultation without rales, wheezing or rhonchi  ABDOMEN: Soft, non-tender, non-distended MUSCULOSKELETAL:  No edema; No deformity  SKIN: Warm and dry NEUROLOGIC:  Alert and oriented x 3 PSYCHIATRIC:  Normal affect    Signed, Shirlee More, MD  01/19/2022 1:08 PM    [facility]

## 2022-01-19 ENCOUNTER — Ambulatory Visit: Payer: Medicare Other | Attending: Cardiology | Admitting: Cardiology

## 2022-01-19 ENCOUNTER — Encounter: Payer: Self-pay | Admitting: Cardiology

## 2022-01-19 VITALS — BP 110/60 | HR 76 | Ht 65.0 in | Wt 194.0 lb

## 2022-01-19 DIAGNOSIS — R002 Palpitations: Secondary | ICD-10-CM | POA: Diagnosis not present

## 2022-01-19 DIAGNOSIS — Z7901 Long term (current) use of anticoagulants: Secondary | ICD-10-CM

## 2022-01-19 DIAGNOSIS — R0789 Other chest pain: Secondary | ICD-10-CM

## 2022-01-19 DIAGNOSIS — E785 Hyperlipidemia, unspecified: Secondary | ICD-10-CM

## 2022-01-19 DIAGNOSIS — E119 Type 2 diabetes mellitus without complications: Secondary | ICD-10-CM | POA: Diagnosis not present

## 2022-01-19 DIAGNOSIS — I10 Essential (primary) hypertension: Secondary | ICD-10-CM

## 2022-01-19 NOTE — Patient Instructions (Signed)
Medication Instructions:  Your physician recommends that you continue on your current medications as directed. Please refer to the Current Medication list given to you today.  *If you need a refill on your cardiac medications before your next appointment, please call your pharmacy*   Lab Work: None If you have labs (blood work) drawn today and your tests are completely normal, you will receive your results only by: [place] (if you have MyChart) OR A paper copy in the mail If you have any lab test that is abnormal or we need to change your treatment, we will call you to review the results.   Testing/Procedures: A chest x-ray takes a picture of the organs and structures inside the chest, including the heart, lungs, and blood vessels. This test can show several things, including, whether the heart is enlarges; whether fluid is building up in the lungs; and whether pacemaker / defibrillator leads are still in place.    Follow-Up: At [facility], you and your health needs are our priority.  As part of our continuing mission to provide you with exceptional heart care, we have created designated Provider Care Teams.  These Care Teams include your primary Cardiologist (physician) and Advanced Practice Providers (APPs -  Physician Assistants and Nurse Practitioners) who all work together to provide you with the care you need, when you need it.  We recommend signing up for the patient portal called "MyChart".  Sign up information is provided on this After Visit Summary.  MyChart is used to connect with patients for Virtual Visits (Telemedicine).  Patients are able to view lab/test results, encounter notes, upcoming appointments, etc.  Non-urgent messages can be sent to your provider as well.   To learn more about what you can do with MyChart, go to [email or web address].    Your next appointment:   9 month(s)  Provider:   Shirlee More, MD    Other Instructions None

## 2022-01-26 DIAGNOSIS — Z1231 Encounter for screening mammogram for malignant neoplasm of breast: Secondary | ICD-10-CM | POA: Diagnosis not present

## 2022-01-26 DIAGNOSIS — R0602 Shortness of breath: Secondary | ICD-10-CM | POA: Diagnosis not present

## 2022-01-26 DIAGNOSIS — J9811 Atelectasis: Secondary | ICD-10-CM | POA: Diagnosis not present

## 2022-01-26 DIAGNOSIS — R079 Chest pain, unspecified: Secondary | ICD-10-CM | POA: Diagnosis not present

## 2022-02-18 DIAGNOSIS — M159 Polyosteoarthritis, unspecified: Secondary | ICD-10-CM | POA: Diagnosis not present

## 2022-02-18 DIAGNOSIS — E785 Hyperlipidemia, unspecified: Secondary | ICD-10-CM | POA: Diagnosis not present

## 2022-02-18 DIAGNOSIS — E559 Vitamin D deficiency, unspecified: Secondary | ICD-10-CM | POA: Diagnosis not present

## 2022-02-18 DIAGNOSIS — E1169 Type 2 diabetes mellitus with other specified complication: Secondary | ICD-10-CM | POA: Diagnosis not present

## 2022-02-18 DIAGNOSIS — D649 Anemia, unspecified: Secondary | ICD-10-CM | POA: Diagnosis not present

## 2022-02-18 DIAGNOSIS — Z79899 Other long term (current) drug therapy: Secondary | ICD-10-CM | POA: Diagnosis not present

## 2022-02-18 DIAGNOSIS — E538 Deficiency of other specified B group vitamins: Secondary | ICD-10-CM | POA: Diagnosis not present

## 2022-02-18 DIAGNOSIS — Z6831 Body mass index (BMI) 31.0-31.9, adult: Secondary | ICD-10-CM | POA: Diagnosis not present

## 2022-02-18 DIAGNOSIS — F419 Anxiety disorder, unspecified: Secondary | ICD-10-CM | POA: Diagnosis not present

## 2022-02-25 DIAGNOSIS — H353231 Exudative age-related macular degeneration, bilateral, with active choroidal neovascularization: Secondary | ICD-10-CM | POA: Diagnosis not present

## 2022-02-28 DIAGNOSIS — J069 Acute upper respiratory infection, unspecified: Secondary | ICD-10-CM | POA: Diagnosis not present

## 2022-02-28 DIAGNOSIS — R0981 Nasal congestion: Secondary | ICD-10-CM | POA: Diagnosis not present

## 2022-02-28 DIAGNOSIS — R051 Acute cough: Secondary | ICD-10-CM | POA: Diagnosis not present

## 2022-04-02 ENCOUNTER — Other Ambulatory Visit: Payer: Self-pay | Admitting: Cardiology

## 2022-06-03 ENCOUNTER — Telehealth: Payer: Self-pay | Admitting: Cardiology

## 2022-06-03 NOTE — Telephone Encounter (Signed)
Called patient and she reported that since she has been taking Diltiazem she has been having weakness and her legs have been hurting and her left foot has been swelling. She states that she has stopped taking the Diltiazem but her symptoms have not improved. She believes she has gained 3 - 4 lbs. due to her left leg swelling. Please advise

## 2022-06-03 NOTE — Telephone Encounter (Signed)
Pt c/o medication issue:  1. Name of Medication:  diltiazem (CARDIZEM CD) 120 MG 24 hr capsule   2. How are you currently taking this medication (dosage and times per day)?    Take 1 capsule (120 mg total) by mouth daily.   3. Are you having a reaction (difficulty breathing--STAT)? no  4. What is your medication issue? Patient states the medication was making her weak, and her legs hurting and left foot swelling. Patient states she stopped taken the medication about a week ago.    Pt c/o swelling: STAT is pt has developed SOB within 24 hours  If swelling, where is the swelling located? Left foot swelling.   How much weight have you gained and in what time span? Patient thinks she has gained 3-4lbs.   Have you gained 3 pounds in a day or 5 pounds in a week? no  Do you have a log of your daily weights (if so, list)? no  Are you currently taking a fluid pill? Yes   Are you currently SOB? no  Have you traveled recently? no

## 2022-06-07 NOTE — Telephone Encounter (Signed)
Attempted to call the patient. Patient did not answer the phone and no voice mail so unable to leave a message .

## 2022-06-08 ENCOUNTER — Other Ambulatory Visit: Payer: Self-pay

## 2022-06-08 MED ORDER — METOPROLOL SUCCINATE ER 25 MG PO TB24: 25.0000 mg | ORAL_TABLET | Freq: Every day | ORAL | 3 refills | Status: DC

## 2022-06-08 NOTE — Telephone Encounter (Signed)
Received the following recommendation from Dr. Dulce Sellar:  "At times the prescription medications can cause leg swelling and it can be a delayed response  Stop diltiazem  Should be improved within 1 to 2 weeks  I would substitute Toprol-XL 25 mg daily."  Patient informed of Dr. Hulen Shouts recommendation and verbalized understanding and had no further questions at this time. Toprol medication ordered via Epic and sent to the patient's pharmacy

## 2022-10-07 DIAGNOSIS — E785 Hyperlipidemia, unspecified: Secondary | ICD-10-CM | POA: Diagnosis not present

## 2022-10-07 DIAGNOSIS — J019 Acute sinusitis, unspecified: Secondary | ICD-10-CM | POA: Diagnosis not present

## 2022-10-07 DIAGNOSIS — Z6832 Body mass index (BMI) 32.0-32.9, adult: Secondary | ICD-10-CM | POA: Diagnosis not present

## 2022-10-07 DIAGNOSIS — F419 Anxiety disorder, unspecified: Secondary | ICD-10-CM | POA: Diagnosis not present

## 2022-10-07 DIAGNOSIS — E538 Deficiency of other specified B group vitamins: Secondary | ICD-10-CM | POA: Diagnosis not present

## 2022-10-07 DIAGNOSIS — M7989 Other specified soft tissue disorders: Secondary | ICD-10-CM | POA: Diagnosis not present

## 2022-10-28 DIAGNOSIS — H353231 Exudative age-related macular degeneration, bilateral, with active choroidal neovascularization: Secondary | ICD-10-CM | POA: Diagnosis not present

## 2022-11-02 ENCOUNTER — Encounter: Payer: Self-pay | Admitting: Cardiology

## 2022-11-02 ENCOUNTER — Ambulatory Visit: Payer: Medicare PPO | Attending: Cardiology | Admitting: Cardiology

## 2022-11-02 VITALS — BP 126/84 | HR 74 | Ht 65.0 in | Wt 187.8 lb

## 2022-11-02 DIAGNOSIS — E119 Type 2 diabetes mellitus without complications: Secondary | ICD-10-CM

## 2022-11-02 DIAGNOSIS — I471 Supraventricular tachycardia, unspecified: Secondary | ICD-10-CM

## 2022-11-02 DIAGNOSIS — R0789 Other chest pain: Secondary | ICD-10-CM | POA: Diagnosis not present

## 2022-11-02 DIAGNOSIS — E785 Hyperlipidemia, unspecified: Secondary | ICD-10-CM

## 2022-11-02 DIAGNOSIS — Z7901 Long term (current) use of anticoagulants: Secondary | ICD-10-CM | POA: Diagnosis not present

## 2022-11-02 DIAGNOSIS — I10 Essential (primary) hypertension: Secondary | ICD-10-CM

## 2022-11-02 NOTE — Progress Notes (Signed)
40 mg by mouth 2 (two) times daily.   LORazepam (ATIVAN) 0.5 MG tablet Take 0.75 mg by mouth at bedtime.   losartan (COZAAR) 50 MG tablet Take 50 mg by mouth daily.   metFORMIN (GLUCOPHAGE) 500 MG tablet Take 500 mg by mouth daily with breakfast. And 250 mg every evening   montelukast (SINGULAIR) 10 MG tablet Take 10 mg by mouth at bedtime.   pantoprazole (PROTONIX) 20 MG tablet Take 20 mg by mouth daily.   Potassium Chloride CR (MICRO-K) 8 MEQ CPCR capsule CR Take 8 mEq by mouth as needed (leg  cramps).   rosuvastatin (CRESTOR) 5 MG tablet Take 2.5 mg by mouth 2 (two) times a week.   traMADol (ULTRAM) 50 MG tablet Take 50 mg by mouth 2 (two) times daily as needed for moderate pain.   [DISCONTINUED] metoprolol succinate (TOPROL XL) 25 MG 24 hr tablet Take 1 tablet (25 mg total) by mouth daily.      EKGs/Labs/Other Studies Reviewed:    The following studies were reviewed today:  Cardiac Studies & Procedures       ECHOCARDIOGRAM  ECHOCARDIOGRAM COMPLETE 12/10/2021  Narrative ECHOCARDIOGRAM REPORT    Patient Name:   [name] Date of Exam: 12/10/2021 Medical Rec #:  [id number]     Height:       65.0 in Accession #:    [id number]    Weight:       192.4 lb Date of Birth:  1945-08-02     BSA:          1.946 m Patient Age:    76 years      BP:           134/68 mmHg Patient Gender: F             HR:           85 bpm. Exam Location:    Procedure: 2D Echo, Cardiac Doppler and Color Doppler  Indications:    Palpitations [R00.2 (ICD-10-CM)]; Chronic anticoagulation [Z79.01 (ICD-10-CM)]; Essential hypertension [I10 (ICD-10-CM)]; Hyperlipidemia, unspecified hyperlipidemia type [E78.5 (ICD-10-CM)]; Diabetes mellitus without complication (HCC) [E11.9 (ICD-10-CM)]  History:        Patient has no prior history of Echocardiogram examinations. Risk Factors:Hypertension and Dyslipidemia. DVT (deep venous thrombosis).  Sonographer:    Margreta Journey RDCS Referring Phys: [id number] Jazelle Achey J [facility]   Sonographer Comments: Suboptimal subcostal window and suboptimal parasternal window. Global longitudinal strain was attempted. IMPRESSIONS   1. Left ventricular ejection fraction, by estimation, is 55 to 60%. The left ventricle has normal function. Left ventricular endocardial border not optimally defined to evaluate regional wall motion. Left ventricular diastolic parameters were normal. 2. Right ventricular systolic function is normal. The right ventricular size is  normal. 3. The mitral valve is normal in structure. No evidence of mitral valve regurgitation. No evidence of mitral stenosis. 4. The aortic valve is tricuspid. Aortic valve regurgitation is not visualized. No aortic stenosis is present.  FINDINGS Left Ventricle: Left ventricular ejection fraction, by estimation, is 55 to 60%. The left ventricle has normal function. Left ventricular endocardial border not optimally defined to evaluate regional wall motion. The left ventricular internal cavity size was normal in size. There is no left ventricular hypertrophy. Left ventricular diastolic parameters were normal. Normal left ventricular filling pressure.  Right Ventricle: The right ventricular size is normal. No increase in right ventricular wall thickness. Right ventricular systolic function is normal.  Left Atrium: Left atrial size was normal in size.  Right  Cardiology Office Note:    Date:  11/02/2022   ID:  [name], DOB August 15, 1945, MRN [id number]  PCP:  Lucianne Lei, MD  Cardiologist:  Norman Herrlich, MD    Referring MD: Lucianne Lei, MD    ASSESSMENT:    1. SVT (supraventricular tachycardia) (HCC)   2. Essential hypertension   3. Hyperlipidemia, unspecified hyperlipidemia type   4. Costochondral chest pain   5. Diabetes mellitus without complication (HCC)   6. Chronic anticoagulation    PLAN:    In order of problems listed above:  Presently doing well on low-dose carvedilol no breakthrough episodes of SVT continue the same Well-controlled continue her current treatment include diuretic low-dose carvedilol Further episodes of chest pain Lipids at target continue her statin Stable diabetes managed with her PCP She will continue her current anticoagulant   Next appointment: 1 year   Medication Adjustments/Labs and Tests Ordered: Current medicines are reviewed at length with the patient today.  Concerns regarding medicines are outlined above.  No orders of the defined types were placed in this encounter.  No orders of the defined types were placed in this encounter.    History of Present Illness:    [name] is a 77 y.o. female with a hx of atrial arrhythmias brief episodes of SVT hypertension chronic costochondral chest pain syndrome type 2 diabetes hyperlipidemia and chronic anticoagulation last seen 01/19/2022. Compliance with diet, lifestyle and medications: Yes  She took a calcium channel blocker had edema and is now taking low-dose carvedilol no longer taking metoprolol. She noticed she has bruises easily in fact she has a cut on her left hand from going out the door this morning No GI or GU bleeding edema shortness of breath chest pain palpitation or syncope Past Medical History:  Diagnosis Date   Anxiety    Arthritis    Chronic cough    Sinus CT ned 03-19-10; allergy eval neg 03-19-2010>>>IgE 38 pos  only for grass; Singulair tiral 03-19-2010   Depression    Diabetes mellitus without complication (HCC)    DVT (deep venous thrombosis) (HCC)    GERD (gastroesophageal reflux disease)    Hilar adenopathy    CT 02-24-2010   Hyperlipidemia    Hypertension    Other and unspecified hyperlipidemia    Other pulmonary embolism and infarction    Pneumonia, organism unspecified(486)    Pulmonary embolism (HCC)    Unspecified asthma(493.90)    Vitamin B12 deficiency     Current Medications: Current Meds  Medication Sig   apixaban (ELIQUIS) 5 MG TABS tablet Take 5 mg by mouth daily.   budesonide-formoterol (SYMBICORT) 160-4.5 MCG/ACT inhaler Inhale 2 puffs into the lungs 2 (two) times daily.   carvedilol (COREG) 3.125 MG tablet Take 3.125 mg by mouth 2 (two) times daily with a meal.   Cetirizine HCl (ZYRTEC PO) Take 1 tablet by mouth daily.   citalopram (CELEXA) 20 MG tablet Take 20 mg by mouth daily.   cyclobenzaprine (FLEXERIL) 10 MG tablet Take 10 mg by mouth 2 (two) times daily as needed for muscle spasms.   Diclofenac Sodium 3 % GEL Apply 1 Application topically daily as needed (pain).   ergocalciferol (VITAMIN D2) 1.25 MG (50000 UT) capsule Take 50,000 Units by mouth once a week.   fenofibrate micronized (LOFIBRA) 134 MG capsule Take 134 mg by mouth daily before breakfast.   fluticasone (FLONASE) 50 MCG/ACT nasal spray Place 2 sprays into both nostrils daily.   furosemide (LASIX) 40 MG tablet Take  40 mg by mouth 2 (two) times daily.   LORazepam (ATIVAN) 0.5 MG tablet Take 0.75 mg by mouth at bedtime.   losartan (COZAAR) 50 MG tablet Take 50 mg by mouth daily.   metFORMIN (GLUCOPHAGE) 500 MG tablet Take 500 mg by mouth daily with breakfast. And 250 mg every evening   montelukast (SINGULAIR) 10 MG tablet Take 10 mg by mouth at bedtime.   pantoprazole (PROTONIX) 20 MG tablet Take 20 mg by mouth daily.   Potassium Chloride CR (MICRO-K) 8 MEQ CPCR capsule CR Take 8 mEq by mouth as needed (leg  cramps).   rosuvastatin (CRESTOR) 5 MG tablet Take 2.5 mg by mouth 2 (two) times a week.   traMADol (ULTRAM) 50 MG tablet Take 50 mg by mouth 2 (two) times daily as needed for moderate pain.   [DISCONTINUED] metoprolol succinate (TOPROL XL) 25 MG 24 hr tablet Take 1 tablet (25 mg total) by mouth daily.      EKGs/Labs/Other Studies Reviewed:    The following studies were reviewed today:  Cardiac Studies & Procedures       ECHOCARDIOGRAM  ECHOCARDIOGRAM COMPLETE 12/10/2021  Narrative ECHOCARDIOGRAM REPORT    Patient Name:   [name] Date of Exam: 12/10/2021 Medical Rec #:  [id number]     Height:       65.0 in Accession #:    [id number]    Weight:       192.4 lb Date of Birth:  1945-08-02     BSA:          1.946 m Patient Age:    76 years      BP:           134/68 mmHg Patient Gender: F             HR:           85 bpm. Exam Location:    Procedure: 2D Echo, Cardiac Doppler and Color Doppler  Indications:    Palpitations [R00.2 (ICD-10-CM)]; Chronic anticoagulation [Z79.01 (ICD-10-CM)]; Essential hypertension [I10 (ICD-10-CM)]; Hyperlipidemia, unspecified hyperlipidemia type [E78.5 (ICD-10-CM)]; Diabetes mellitus without complication (HCC) [E11.9 (ICD-10-CM)]  History:        Patient has no prior history of Echocardiogram examinations. Risk Factors:Hypertension and Dyslipidemia. DVT (deep venous thrombosis).  Sonographer:    Margreta Journey RDCS Referring Phys: [id number] Jazelle Achey J [facility]   Sonographer Comments: Suboptimal subcostal window and suboptimal parasternal window. Global longitudinal strain was attempted. IMPRESSIONS   1. Left ventricular ejection fraction, by estimation, is 55 to 60%. The left ventricle has normal function. Left ventricular endocardial border not optimally defined to evaluate regional wall motion. Left ventricular diastolic parameters were normal. 2. Right ventricular systolic function is normal. The right ventricular size is  normal. 3. The mitral valve is normal in structure. No evidence of mitral valve regurgitation. No evidence of mitral stenosis. 4. The aortic valve is tricuspid. Aortic valve regurgitation is not visualized. No aortic stenosis is present.  FINDINGS Left Ventricle: Left ventricular ejection fraction, by estimation, is 55 to 60%. The left ventricle has normal function. Left ventricular endocardial border not optimally defined to evaluate regional wall motion. The left ventricular internal cavity size was normal in size. There is no left ventricular hypertrophy. Left ventricular diastolic parameters were normal. Normal left ventricular filling pressure.  Right Ventricle: The right ventricular size is normal. No increase in right ventricular wall thickness. Right ventricular systolic function is normal.  Left Atrium: Left atrial size was normal in size.  Right  40 mg by mouth 2 (two) times daily.   LORazepam (ATIVAN) 0.5 MG tablet Take 0.75 mg by mouth at bedtime.   losartan (COZAAR) 50 MG tablet Take 50 mg by mouth daily.   metFORMIN (GLUCOPHAGE) 500 MG tablet Take 500 mg by mouth daily with breakfast. And 250 mg every evening   montelukast (SINGULAIR) 10 MG tablet Take 10 mg by mouth at bedtime.   pantoprazole (PROTONIX) 20 MG tablet Take 20 mg by mouth daily.   Potassium Chloride CR (MICRO-K) 8 MEQ CPCR capsule CR Take 8 mEq by mouth as needed (leg  cramps).   rosuvastatin (CRESTOR) 5 MG tablet Take 2.5 mg by mouth 2 (two) times a week.   traMADol (ULTRAM) 50 MG tablet Take 50 mg by mouth 2 (two) times daily as needed for moderate pain.   [DISCONTINUED] metoprolol succinate (TOPROL XL) 25 MG 24 hr tablet Take 1 tablet (25 mg total) by mouth daily.      EKGs/Labs/Other Studies Reviewed:    The following studies were reviewed today:  Cardiac Studies & Procedures       ECHOCARDIOGRAM  ECHOCARDIOGRAM COMPLETE 12/10/2021  Narrative ECHOCARDIOGRAM REPORT    Patient Name:   [name] Date of Exam: 12/10/2021 Medical Rec #:  [id number]     Height:       65.0 in Accession #:    [id number]    Weight:       192.4 lb Date of Birth:  1945-08-02     BSA:          1.946 m Patient Age:    76 years      BP:           134/68 mmHg Patient Gender: F             HR:           85 bpm. Exam Location:    Procedure: 2D Echo, Cardiac Doppler and Color Doppler  Indications:    Palpitations [R00.2 (ICD-10-CM)]; Chronic anticoagulation [Z79.01 (ICD-10-CM)]; Essential hypertension [I10 (ICD-10-CM)]; Hyperlipidemia, unspecified hyperlipidemia type [E78.5 (ICD-10-CM)]; Diabetes mellitus without complication (HCC) [E11.9 (ICD-10-CM)]  History:        Patient has no prior history of Echocardiogram examinations. Risk Factors:Hypertension and Dyslipidemia. DVT (deep venous thrombosis).  Sonographer:    Margreta Journey RDCS Referring Phys: [id number] Jazelle Achey J [facility]   Sonographer Comments: Suboptimal subcostal window and suboptimal parasternal window. Global longitudinal strain was attempted. IMPRESSIONS   1. Left ventricular ejection fraction, by estimation, is 55 to 60%. The left ventricle has normal function. Left ventricular endocardial border not optimally defined to evaluate regional wall motion. Left ventricular diastolic parameters were normal. 2. Right ventricular systolic function is normal. The right ventricular size is  normal. 3. The mitral valve is normal in structure. No evidence of mitral valve regurgitation. No evidence of mitral stenosis. 4. The aortic valve is tricuspid. Aortic valve regurgitation is not visualized. No aortic stenosis is present.  FINDINGS Left Ventricle: Left ventricular ejection fraction, by estimation, is 55 to 60%. The left ventricle has normal function. Left ventricular endocardial border not optimally defined to evaluate regional wall motion. The left ventricular internal cavity size was normal in size. There is no left ventricular hypertrophy. Left ventricular diastolic parameters were normal. Normal left ventricular filling pressure.  Right Ventricle: The right ventricular size is normal. No increase in right ventricular wall thickness. Right ventricular systolic function is normal.  Left Atrium: Left atrial size was normal in size.  Right

## 2022-11-02 NOTE — Patient Instructions (Signed)
Medication Instructions:  Your physician recommends that you continue on your current medications as directed. Please refer to the Current Medication list given to you today.  *If you need a refill on your cardiac medications before your next appointment, please call your pharmacy*   Lab Work: None If you have labs (blood work) drawn today and your tests are completely normal, you will receive your results only by: [facility] (if you have MyChart) OR A paper copy in the mail If you have any lab test that is abnormal or we need to change your treatment, we will call you to review the results.   Testing/Procedures: None   Follow-Up: At [facility], you and your health needs are our priority.  As part of our continuing mission to provide you with exceptional heart care, we have created designated Provider Care Teams.  These Care Teams include your primary Cardiologist (physician) and Advanced Practice Providers (APPs -  Physician Assistants and Nurse Practitioners) who all work together to provide you with the care you need, when you need it.  We recommend signing up for the patient portal called "MyChart".  Sign up information is provided on this After Visit Summary.  MyChart is used to connect with patients for Virtual Visits (Telemedicine).  Patients are able to view lab/test results, encounter notes, upcoming appointments, etc.  Non-urgent messages can be sent to your provider as well.   To learn more about what you can do with MyChart, go to [email or web address].    Your next appointment:   1 year  Provider:   Shirlee More, MD    Other Instructions None

## 2022-11-09 DIAGNOSIS — I10 Essential (primary) hypertension: Secondary | ICD-10-CM | POA: Diagnosis not present

## 2022-11-09 DIAGNOSIS — M7989 Other specified soft tissue disorders: Secondary | ICD-10-CM | POA: Diagnosis not present

## 2022-11-09 DIAGNOSIS — M159 Polyosteoarthritis, unspecified: Secondary | ICD-10-CM | POA: Diagnosis not present

## 2022-11-09 DIAGNOSIS — M79669 Pain in unspecified lower leg: Secondary | ICD-10-CM | POA: Diagnosis not present

## 2022-11-09 DIAGNOSIS — F419 Anxiety disorder, unspecified: Secondary | ICD-10-CM | POA: Diagnosis not present

## 2022-11-09 DIAGNOSIS — J309 Allergic rhinitis, unspecified: Secondary | ICD-10-CM | POA: Diagnosis not present

## 2022-11-09 DIAGNOSIS — E538 Deficiency of other specified B group vitamins: Secondary | ICD-10-CM | POA: Diagnosis not present

## 2022-11-09 DIAGNOSIS — K219 Gastro-esophageal reflux disease without esophagitis: Secondary | ICD-10-CM | POA: Diagnosis not present

## 2022-11-09 DIAGNOSIS — M7072 Other bursitis of hip, left hip: Secondary | ICD-10-CM | POA: Diagnosis not present

## 2022-11-15 DIAGNOSIS — M47817 Spondylosis without myelopathy or radiculopathy, lumbosacral region: Secondary | ICD-10-CM | POA: Diagnosis not present

## 2022-11-19 DIAGNOSIS — M65341 Trigger finger, right ring finger: Secondary | ICD-10-CM | POA: Diagnosis not present

## 2022-11-25 DIAGNOSIS — R6883 Chills (without fever): Secondary | ICD-10-CM | POA: Diagnosis not present

## 2022-11-25 DIAGNOSIS — R0981 Nasal congestion: Secondary | ICD-10-CM | POA: Diagnosis not present

## 2022-11-25 DIAGNOSIS — M791 Myalgia, unspecified site: Secondary | ICD-10-CM | POA: Diagnosis not present

## 2022-12-09 DIAGNOSIS — D509 Iron deficiency anemia, unspecified: Secondary | ICD-10-CM | POA: Diagnosis not present

## 2022-12-09 DIAGNOSIS — F419 Anxiety disorder, unspecified: Secondary | ICD-10-CM | POA: Diagnosis not present

## 2022-12-09 DIAGNOSIS — Z8616 Personal history of COVID-19: Secondary | ICD-10-CM | POA: Diagnosis not present

## 2022-12-09 DIAGNOSIS — M720 Palmar fascial fibromatosis [Dupuytren]: Secondary | ICD-10-CM | POA: Diagnosis not present

## 2022-12-09 DIAGNOSIS — Z79899 Other long term (current) drug therapy: Secondary | ICD-10-CM | POA: Diagnosis not present

## 2022-12-09 DIAGNOSIS — E559 Vitamin D deficiency, unspecified: Secondary | ICD-10-CM | POA: Diagnosis not present

## 2022-12-09 DIAGNOSIS — Z6832 Body mass index (BMI) 32.0-32.9, adult: Secondary | ICD-10-CM | POA: Diagnosis not present

## 2022-12-09 DIAGNOSIS — E1169 Type 2 diabetes mellitus with other specified complication: Secondary | ICD-10-CM | POA: Diagnosis not present

## 2022-12-09 DIAGNOSIS — E538 Deficiency of other specified B group vitamins: Secondary | ICD-10-CM | POA: Diagnosis not present

## 2022-12-09 DIAGNOSIS — D649 Anemia, unspecified: Secondary | ICD-10-CM | POA: Diagnosis not present

## 2022-12-09 DIAGNOSIS — L989 Disorder of the skin and subcutaneous tissue, unspecified: Secondary | ICD-10-CM | POA: Diagnosis not present

## 2022-12-16 DIAGNOSIS — L578 Other skin changes due to chronic exposure to nonionizing radiation: Secondary | ICD-10-CM | POA: Diagnosis not present

## 2022-12-16 DIAGNOSIS — L821 Other seborrheic keratosis: Secondary | ICD-10-CM | POA: Diagnosis not present

## 2022-12-16 DIAGNOSIS — L570 Actinic keratosis: Secondary | ICD-10-CM | POA: Diagnosis not present

## 2022-12-23 DIAGNOSIS — H353231 Exudative age-related macular degeneration, bilateral, with active choroidal neovascularization: Secondary | ICD-10-CM | POA: Diagnosis not present

## 2022-12-23 NOTE — Progress Notes (Unsigned)
[facility]  [address] [place],  [place]  [address] [phone]  Clinic Day:  12/24/2022  Referring physician: Lucianne Lei, MD   HISTORY OF PRESENT ILLNESS:  The patient is a 77 y.o. female with history of bilateral pulmonary emboli and an extensive left lower extremity DVT.  Her previous blood clots are related to oral contraception/herbal hormonal therapy being given at the same time.   Her hypercoagulable workup done in the past came back negative for any clotting disorders.  However, she comes in today as she has felt weak over these past few months.  She claims recent blood work at her family office showed a hemoglobin in the 9 range.  She denies having any overt forms of blood loss.  She referred herself here to get her anemia evaluated.   PHYSICAL EXAM:  Blood pressure (!) 195/62, pulse 67, temperature 98.2 F (36.8 C), temperature source Oral, resp. rate 16, height 5\' 5"  (1.651 m), weight 189 lb 12.8 oz (86.1 kg), SpO2 97%. Wt Readings from Last 3 Encounters:  12/24/22 189 lb 12.8 oz (86.1 kg)  11/02/22 187 lb 12.8 oz (85.2 kg)  01/19/22 194 lb (88 kg)   Body mass index is 31.58 kg/m. Performance status (ECOG): {CHL ONC [id number] Physical Exam  LABS:      Latest Ref Rng & Units 12/24/2022   12:22 PM 09/12/2010    3:55 AM 09/11/2010    3:50 AM  CBC  WBC 4.0 - 10.5 K/uL 7.4  7.9  9.8   Hemoglobin 12.0 - 15.0 g/dL 9.5  9.8  91.4   Hematocrit 36.0 - 46.0 % 28.2  30.7  34.7   Platelets 150 - 400 K/uL 373  248  237       Latest Ref Rng & Units 12/24/2022   12:22 PM 12/23/2020   11:30 AM 09/12/2010    3:55 AM  CMP  Glucose 70 - 99 mg/dL 83  78  782   BUN 8 - 23 mg/dL 31  20  10    Creatinine 0.44 - 1.00 mg/dL 9.56  2.13  0.86   Sodium 135 - 145 mmol/L 137  137  140   Potassium 3.5 - 5.1 mmol/L 4.5  4.5  3.6   Chloride 98 - 111 mmol/L 100  105  104   CO2 22 - 32 mmol/L 26  26  32   Calcium 8.9 - 10.3 mg/dL 9.2  8.7  8.5    Total Protein 6.5 - 8.1 g/dL 6.1     Total Bilirubin <1.2 mg/dL 0.3     Alkaline Phos 38 - 126 U/L 38     AST 15 - 41 U/L 38     ALT 0 - 44 U/L 15        No results found for: "CEA1", "CEA" / No results found for: "CEA1", "CEA" No results found for: "PSA1" No results found for: "VHQ469" No results found for: "CAN125"  No results found for: "TOTALPROTELP", "ALBUMINELP", "A1GS", "A2GS", "BETS", "BETA2SER", "GAMS", "MSPIKE", "SPEI" No results found for: "TIBC", "FERRITIN", "IRONPCTSAT" Lab Results  Component Value Date   LDH 175 12/24/2022       Component Value Date/Time   LDH 175 12/24/2022 1222    Review Flowsheet       Latest Ref Rng & Units 12/24/2022  Oncology Labs  LDH 98 - 192 U/L 175      STUDIES:  No results found.    ASSESSMENT & PLAN:  Assessment/Plan:  A 77 y.o. female with *** .The patient understands all the plans discussed today and is in agreement with them.      Udell Blasingame Kirby Funk, MD

## 2022-12-24 ENCOUNTER — Inpatient Hospital Stay: Payer: Self-pay | Attending: Oncology | Admitting: Oncology

## 2022-12-24 ENCOUNTER — Inpatient Hospital Stay: Payer: Medicare PPO

## 2022-12-24 ENCOUNTER — Other Ambulatory Visit: Payer: Self-pay | Admitting: Oncology

## 2022-12-24 ENCOUNTER — Telehealth: Payer: Self-pay

## 2022-12-24 VITALS — BP 195/62 | HR 67 | Temp 98.2°F | Resp 16 | Ht 65.0 in | Wt 189.8 lb

## 2022-12-24 DIAGNOSIS — D508 Other iron deficiency anemias: Secondary | ICD-10-CM

## 2022-12-24 DIAGNOSIS — N289 Disorder of kidney and ureter, unspecified: Secondary | ICD-10-CM | POA: Diagnosis not present

## 2022-12-24 DIAGNOSIS — E611 Iron deficiency: Secondary | ICD-10-CM | POA: Insufficient documentation

## 2022-12-24 DIAGNOSIS — D649 Anemia, unspecified: Secondary | ICD-10-CM | POA: Diagnosis not present

## 2022-12-24 DIAGNOSIS — Z86711 Personal history of pulmonary embolism: Secondary | ICD-10-CM | POA: Insufficient documentation

## 2022-12-24 DIAGNOSIS — Z86718 Personal history of other venous thrombosis and embolism: Secondary | ICD-10-CM | POA: Diagnosis not present

## 2022-12-24 DIAGNOSIS — D509 Iron deficiency anemia, unspecified: Secondary | ICD-10-CM

## 2022-12-24 LAB — CBC WITH DIFFERENTIAL (CANCER CENTER ONLY)
Abs Immature Granulocytes: 0.03 10*3/uL (ref 0.00–0.07)
Basophils Absolute: 0.1 10*3/uL (ref 0.0–0.1)
Basophils Relative: 1 %
Eosinophils Absolute: 0.2 10*3/uL (ref 0.0–0.5)
Eosinophils Relative: 3 %
HCT: 28.2 % — ABNORMAL LOW (ref 36.0–46.0)
Hemoglobin: 9.5 g/dL — ABNORMAL LOW (ref 12.0–15.0)
Immature Granulocytes: 0 %
Lymphocytes Relative: 19 %
Lymphs Abs: 1.4 10*3/uL (ref 0.7–4.0)
MCH: 28.5 pg (ref 26.0–34.0)
MCHC: 33.7 g/dL (ref 30.0–36.0)
MCV: 84.7 fL (ref 80.0–100.0)
Monocytes Absolute: 0.6 10*3/uL (ref 0.1–1.0)
Monocytes Relative: 9 %
Neutro Abs: 5 10*3/uL (ref 1.7–7.7)
Neutrophils Relative %: 68 %
Platelet Count: 373 10*3/uL (ref 150–400)
RBC: 3.33 MIL/uL — ABNORMAL LOW (ref 3.87–5.11)
RDW: 15 % (ref 11.5–15.5)
WBC Count: 7.4 10*3/uL (ref 4.0–10.5)
nRBC: 0 % (ref 0.0–0.2)
nRBC: 0 /100{WBCs}

## 2022-12-24 LAB — IRON AND TIBC
Iron: 52 ug/dL (ref 28–170)
Saturation Ratios: 10 % — ABNORMAL LOW (ref 10.4–31.8)
TIBC: 539 ug/dL — ABNORMAL HIGH (ref 250–450)
UIBC: 487 ug/dL

## 2022-12-24 LAB — FERRITIN: Ferritin: 15 ng/mL (ref 11–307)

## 2022-12-24 LAB — FOLATE: Folate: 16.7 ng/mL (ref >5.9)

## 2022-12-24 LAB — CMP (CANCER CENTER ONLY)
ALT: 15 U/L (ref 0–44)
AST: 38 U/L (ref 15–41)
Albumin: 3.7 g/dL (ref 3.5–5.0)
Alkaline Phosphatase: 38 U/L (ref 38–126)
Anion gap: 10 (ref 5–15)
BUN: 31 mg/dL — ABNORMAL HIGH (ref 8–23)
CO2: 26 mmol/L (ref 22–32)
Calcium: 9.2 mg/dL (ref 8.9–10.3)
Chloride: 100 mmol/L (ref 98–111)
Creatinine: 1.29 mg/dL — ABNORMAL HIGH (ref 0.44–1.00)
GFR, Estimated: 42 mL/min — ABNORMAL LOW (ref >60)
Glucose, Bld: 83 mg/dL (ref 70–99)
Potassium: 4.5 mmol/L (ref 3.5–5.1)
Sodium: 137 mmol/L (ref 135–145)
Total Bilirubin: 0.3 mg/dL (ref <1.2)
Total Protein: 6.1 g/dL — ABNORMAL LOW (ref 6.5–8.1)

## 2022-12-24 LAB — TSH: TSH: 1.505 u[IU]/mL (ref 0.350–4.500)

## 2022-12-24 LAB — VITAMIN B12: Vitamin B-12: 680 pg/mL (ref 180–914)

## 2022-12-24 LAB — LACTATE DEHYDROGENASE: LDH: 175 U/L (ref 98–192)

## 2022-12-24 NOTE — Telephone Encounter (Signed)
Pt has called to req iron results, they are still in process as of 12/24/2022 @ 1425, & 1604.   Latest Reference Range & Units 12/24/22 12:22  Iron 28 - 170 ug/dL 52  UIBC ug/dL 737  TIBC 106 - 269 ug/dL 485 (H)  Saturation Ratios 10.4 - 31.8 % 10 (L)  Ferritin 11 - 307 ng/mL 15  Folate >5.9 ng/mL 16.7  Vitamin B12 180 - 914 pg/mL 680  (H): Data is abnormally high (L): Data is abnormally low  12/27/23- Dr Melvyn Neth reviewed above results, and recommends Iron infusion.

## 2022-12-28 ENCOUNTER — Other Ambulatory Visit: Payer: Self-pay | Admitting: Orthopedic Surgery

## 2022-12-30 ENCOUNTER — Other Ambulatory Visit: Payer: Self-pay | Admitting: Pharmacist

## 2022-12-30 DIAGNOSIS — D509 Iron deficiency anemia, unspecified: Secondary | ICD-10-CM | POA: Insufficient documentation

## 2022-12-31 ENCOUNTER — Inpatient Hospital Stay (HOSPITAL_BASED_OUTPATIENT_CLINIC_OR_DEPARTMENT_OTHER): Payer: Medicare PPO | Admitting: Oncology

## 2022-12-31 ENCOUNTER — Telehealth: Payer: Self-pay | Admitting: Oncology

## 2022-12-31 VITALS — BP 152/60 | HR 72 | Temp 97.9°F | Resp 16 | Ht 65.0 in | Wt 187.1 lb

## 2022-12-31 DIAGNOSIS — N289 Disorder of kidney and ureter, unspecified: Secondary | ICD-10-CM | POA: Diagnosis not present

## 2022-12-31 DIAGNOSIS — Z86718 Personal history of other venous thrombosis and embolism: Secondary | ICD-10-CM | POA: Diagnosis not present

## 2022-12-31 DIAGNOSIS — D649 Anemia, unspecified: Secondary | ICD-10-CM | POA: Diagnosis not present

## 2022-12-31 DIAGNOSIS — D508 Other iron deficiency anemias: Secondary | ICD-10-CM | POA: Diagnosis not present

## 2022-12-31 DIAGNOSIS — E611 Iron deficiency: Secondary | ICD-10-CM | POA: Diagnosis not present

## 2022-12-31 DIAGNOSIS — Z86711 Personal history of pulmonary embolism: Secondary | ICD-10-CM | POA: Diagnosis not present

## 2022-12-31 NOTE — Telephone Encounter (Signed)
Patient has been scheduled. Aware of appt date and time  

## 2022-12-31 NOTE — Telephone Encounter (Signed)
Contacted pt to schedule an appt. Unable to reach via phone, voicemail was left.     Follow-Up Information  Follow-up disposition: Return in about 3 months (around 03/31/2023).  Check out comments: Iv iron next Thursday    Scheduling Message Entered by Domenic Schwab on 12/30/2022 at  9:14 AM Priority: Routine <No visit type provided>  Department: [facility]  Provider:  Appointment Notes:  Please schedule pt for 5 doses of venofer

## 2022-12-31 NOTE — Progress Notes (Signed)
[facility]  [address] [place],  [place]  [address] [phone]  Clinic Day:  12/31/2022  Referring physician: Lucianne Lei, MD   HISTORY OF PRESENT ILLNESS:  The patient is a 77 y.o. female who I recently again seen for anemia.  She comes in today to go over all of her recent labs to determine the etiology behind this.  Since her last visit, she has been doing fairly well.  She denies having any overt forms of blood loss.  However, she does claim to feel excessively fatigued.  Of note, a GI workup done in September 2023 showed no adverse GI tract pathology.  PHYSICAL EXAM:  Blood pressure (!) 152/60, pulse 72, temperature 97.9 F (36.6 C), temperature source Oral, resp. rate 16, height 5\' 5"  (1.651 m), weight 187 lb 1.6 oz (84.9 kg), SpO2 99%. Wt Readings from Last 3 Encounters:  12/31/22 187 lb 1.6 oz (84.9 kg)  12/24/22 189 lb 12.8 oz (86.1 kg)  11/02/22 187 lb 12.8 oz (85.2 kg)   Body mass index is 31.14 kg/m. Performance status (ECOG): 1 - Symptomatic but completely ambulatory Physical Exam Constitutional:      Appearance: Normal appearance. She is not ill-appearing.  HENT:     Mouth/Throat:     Mouth: Mucous membranes are moist.     Pharynx: Oropharynx is clear. No oropharyngeal exudate or posterior oropharyngeal erythema.  Cardiovascular:     Rate and Rhythm: Normal rate and regular rhythm.     Heart sounds: No murmur heard.    No friction rub. No gallop.  Pulmonary:     Effort: Pulmonary effort is normal. No respiratory distress.     Breath sounds: Normal breath sounds. No wheezing, rhonchi or rales.  Abdominal:     General: Bowel sounds are normal. There is no distension.     Palpations: Abdomen is soft. There is no mass.     Tenderness: There is no abdominal tenderness.  Musculoskeletal:        General: No swelling.     Right lower leg: No edema.     Left lower leg: No edema.  Lymphadenopathy:     Cervical: No cervical  adenopathy.     Upper Body:     Right upper body: No supraclavicular or axillary adenopathy.     Left upper body: No supraclavicular or axillary adenopathy.     Lower Body: No right inguinal adenopathy. No left inguinal adenopathy.  Skin:    General: Skin is warm.     Coloration: Skin is not jaundiced.     Findings: No lesion or rash.  Neurological:     General: No focal deficit present.     Mental Status: She is alert and oriented to person, place, and time. Mental status is at baseline.  Psychiatric:        Mood and Affect: Mood normal.        Behavior: Behavior normal.        Thought Content: Thought content normal.    LABS:      Latest Ref Rng & Units 12/24/2022   12:22 PM 09/12/2010    3:55 AM 09/11/2010    3:50 AM  CBC  WBC 4.0 - 10.5 K/uL 7.4  7.9  9.8   Hemoglobin 12.0 - 15.0 g/dL 9.5  9.8  91.4   Hematocrit 36.0 - 46.0 % 28.2  30.7  34.7   Platelets 150 - 400 K/uL 373  248  237  Latest Ref Rng & Units 12/24/2022   12:22 PM 12/23/2020   11:30 AM 09/12/2010    3:55 AM  CMP  Glucose 70 - 99 mg/dL 83  78  161   BUN 8 - 23 mg/dL 31  20  10    Creatinine 0.44 - 1.00 mg/dL 0.96  0.45  4.09   Sodium 135 - 145 mmol/L 137  137  140   Potassium 3.5 - 5.1 mmol/L 4.5  4.5  3.6   Chloride 98 - 111 mmol/L 100  105  104   CO2 22 - 32 mmol/L 26  26  32   Calcium 8.9 - 10.3 mg/dL 9.2  8.7  8.5   Total Protein 6.5 - 8.1 g/dL 6.1     Total Bilirubin <1.2 mg/dL 0.3     Alkaline Phos 38 - 126 U/L 38     AST 15 - 41 U/L 38     ALT 0 - 44 U/L 15       Latest Reference Range & Units 12/24/22 12:22  Iron 28 - 170 ug/dL 52  UIBC ug/dL 811  TIBC 914 - 782 ug/dL 956 (H)  Saturation Ratios 10.4 - 31.8 % 10 (L)  Ferritin 11 - 307 ng/mL 15  Folate >5.9 ng/mL 16.7  Vitamin B12 180 - 914 pg/mL 680  (H): Data is abnormally high (L): Data is abnormally low  ASSESSMENT & PLAN:  Assessment/Plan:  A 77 y.o. female who I recently began seeing for anemia.  When evaluating all of her  recent labs, it appears she is iron deficient.  Based upon this, I will arrange for her to receive IV iron over these next few weeks to rapidly replenish her iron stores and improve her hemoglobin.  She has a mild component of renal insufficiency which may be factoring into her anemia.  This will continue to be followed over time.  Otherwise, I will see her back in 3 months to see how well she responded to her IV iron.  The patient understands all the plans discussed today and is in agreement with them.      Tinea Nobile Kirby Funk, MD

## 2023-01-02 LAB — PROTEIN ELECTROPHORESIS, SERUM
A/G Ratio: 1.1 (ref 0.7–1.7)
Albumin ELP: 3.1 g/dL (ref 2.9–4.4)
Alpha-1-Globulin: 0.2 g/dL (ref 0.0–0.4)
Alpha-2-Globulin: 0.6 g/dL (ref 0.4–1.0)
Beta Globulin: 1.1 g/dL (ref 0.7–1.3)
Gamma Globulin: 0.8 g/dL (ref 0.4–1.8)
Globulin, Total: 2.7 g/dL (ref 2.2–3.9)
Total Protein ELP: 5.8 g/dL — ABNORMAL LOW (ref 6.0–8.5)

## 2023-01-04 ENCOUNTER — Telehealth: Payer: Self-pay

## 2023-01-04 NOTE — Telephone Encounter (Signed)
   Pre-operative Risk Assessment    Patient Name: [name]  DOB: June 30, 1945 MRN: [id number]   Date of last office visit: 11-02-22 Date of next office visit: N/A   Request for Surgical Clearance    Procedure:   right ring finger trigger release  Date of Surgery:  Clearance 01/20/23                                Surgeon:  Der. Franky Curia Surgeon's Group or Practice Name:  [facility] Phone number:  [phone] Fax number:  [phone] Attention Rhonda    Type of Clearance Requested:   - Pharmacy:  Hold Apixaban (Eliquis) please advise   Type of Anesthesia:  Not Indicated   Additional requests/questions:    [name]   01/04/2023, 9:58 AM

## 2023-01-06 ENCOUNTER — Telehealth: Payer: Self-pay | Admitting: *Deleted

## 2023-01-06 ENCOUNTER — Inpatient Hospital Stay: Payer: HMO | Attending: Oncology

## 2023-01-06 ENCOUNTER — Encounter: Payer: Self-pay | Admitting: Oncology

## 2023-01-06 VITALS — BP 164/62 | HR 66 | Temp 98.2°F | Resp 16 | Wt 190.1 lb

## 2023-01-06 DIAGNOSIS — D509 Iron deficiency anemia, unspecified: Secondary | ICD-10-CM | POA: Insufficient documentation

## 2023-01-06 MED ORDER — SODIUM CHLORIDE 0.9 % IV SOLN: INTRAVENOUS | Status: DC

## 2023-01-06 MED ORDER — IRON SUCROSE 20 MG/ML IV SOLN
200.0000 mg | Freq: Once | INTRAVENOUS | Status: AC
  Administered 2023-01-06: 200 mg via INTRAVENOUS
  Filled 2023-01-06: qty 10

## 2023-01-06 NOTE — Telephone Encounter (Signed)
 Per per preop APP today Eligha Bridegroom, NP ok to add pt on 01/12/23 due to procedure date and med hold. We did offer add on 1/6 or 1/7 but pt has cancer appts both days 3 pm.   Med rec and consent are done.

## 2023-01-06 NOTE — Telephone Encounter (Signed)
 Primary Cardiologist:None   Preoperative team, please contact this patient and set up a phone call appointment for further preoperative risk assessment. Please obtain consent and complete medication review. Thank you for your help.   I confirm that guidance regarding antiplatelet and oral anticoagulation therapy has been completed and, if necessary, noted below.  Per office protocol, patient can hold Eliquis for 1-2 days prior to procedure.   I also confirmed the patient resides in the state of [place] . As per [facility] Medical Board telemedicine laws, the patient must reside in the state in which the provider is licensed.   Rosaline EMERSON Bane, NP-C  01/06/2023, 10:15 AM [address], [address] Office [phone] Fax [phone]

## 2023-01-06 NOTE — Telephone Encounter (Signed)
 Patient with diagnosis of hx of DVT on Eliquis for anticoagulation.    Procedure: right ring finger trigger release   Date of procedure: 01/20/2023       CrCl 48 mL/min Platelet count 373 K    Per office protocol, patient can hold Eliquis for 1-2 days prior to procedure.     **This guidance is not considered finalized until pre-operative APP has relayed final recommendations.**

## 2023-01-06 NOTE — Telephone Encounter (Signed)
 Per per preop APP today Kathryn Bane, NP ok to add pt on 01/12/23 due to procedure date and med hold. We did offer add on 1/6 or 1/7 but pt has cancer appts both days 3 pm.   Med rec and consent are done.     Patient Consent for Virtual Visit        [name] has provided verbal consent on 01/06/2023 for a virtual visit (video or telephone).   CONSENT FOR VIRTUAL VISIT FOR:  [name]  By participating in this virtual visit I agree to the following:  I hereby voluntarily request, consent and authorize [facility] and its employed or contracted physicians, physician assistants, nurse practitioners or other licensed health care professionals (the Practitioner), to provide me with telemedicine health care services (the "Services) as deemed necessary by the treating Practitioner. I acknowledge and consent to receive the Services by the Practitioner via telemedicine. I understand that the telemedicine visit will involve communicating with the Practitioner through live audiovisual communication technology and the disclosure of certain medical information by electronic transmission. I acknowledge that I have been given the opportunity to request an in-person assessment or other available alternative prior to the telemedicine visit and am voluntarily participating in the telemedicine visit.  I understand that I have the right to withhold or withdraw my consent to the use of telemedicine in the course of my care at any time, without affecting my right to future care or treatment, and that the Practitioner or I may terminate the telemedicine visit at any time. I understand that I have the right to inspect all information obtained and/or recorded in the course of the telemedicine visit and may receive copies of available information for a reasonable fee.  I understand that some of the potential risks of receiving the Services via telemedicine include:  Delay or interruption in medical  evaluation due to technological equipment failure or disruption; Information transmitted may not be sufficient (e.g. poor resolution of images) to allow for appropriate medical decision making by the Practitioner; and/or  In rare instances, security protocols could fail, causing a breach of personal health information.  Furthermore, I acknowledge that it is my responsibility to provide information about my medical history, conditions and care that is complete and accurate to the best of my ability. I acknowledge that Practitioner's advice, recommendations, and/or decision may be based on factors not within their control, such as incomplete or inaccurate data provided by me or distortions of diagnostic images or specimens that may result from electronic transmissions. I understand that the practice of medicine is not an exact science and that Practitioner makes no warranties or guarantees regarding treatment outcomes. I acknowledge that a copy of this consent can be made available to me via my patient portal [facility] MyChart), or I can request a printed copy by calling the office of [facility].    I understand that my insurance will be billed for this visit.   I have read or had this consent read to me. I understand the contents of this consent, which adequately explains the benefits and risks of the Services being provided via telemedicine.  I have been provided ample opportunity to ask questions regarding this consent and the Services and have had my questions answered to my satisfaction. I give my informed consent for the services to be provided through the use of telemedicine in my medical care

## 2023-01-06 NOTE — Patient Instructions (Signed)
 Iron Sucrose Injection What is this medication? IRON SUCROSE (EYE ern SOO krose) treats low levels of iron (iron deficiency anemia) in people with kidney disease. Iron is a mineral that plays an important role in making red blood cells, which carry oxygen from your lungs to the rest of your body. This medicine may be used for other purposes; ask your health care provider or pharmacist if you have questions. COMMON BRAND NAME(S): Venofer What should I tell my care team before I take this medication? They need to know if you have any of these conditions: Anemia not caused by low iron levels Heart disease High levels of iron in the blood Kidney disease Liver disease An unusual or allergic reaction to iron, other medications, foods, dyes, or preservatives Pregnant or trying to get pregnant Breastfeeding How should I use this medication? This medication is for infusion into a vein. It is given in a hospital or clinic setting. Talk to your care team about the use of this medication in children. While this medication may be prescribed for children as young as 2 years for selected conditions, precautions do apply. Overdosage: If you think you have taken too much of this medicine contact a poison control center or emergency room at once. NOTE: This medicine is only for you. Do not share this medicine with others. What if I miss a dose? Keep appointments for follow-up doses. It is important not to miss your dose. Call your care team if you are unable to keep an appointment. What may interact with this medication? Do not take this medication with any of the following: Deferoxamine Dimercaprol Other iron products This medication may also interact with the following: Chloramphenicol Deferasirox This list may not describe all possible interactions. Give your health care provider a list of all the medicines, herbs, non-prescription drugs, or dietary supplements you use. Also tell them if you smoke,  drink alcohol, or use illegal drugs. Some items may interact with your medicine. What should I watch for while using this medication? Visit your care team regularly. Tell your care team if your symptoms do not start to get better or if they get worse. You may need blood work done while you are taking this medication. You may need to follow a special diet. Talk to your care team. Foods that contain iron include: whole grains/cereals, dried fruits, beans, or peas, leafy green vegetables, and organ meats (liver, kidney). What side effects may I notice from receiving this medication? Side effects that you should report to your care team as soon as possible: Allergic reactions--skin rash, itching, hives, swelling of the face, lips, tongue, or throat Low blood pressure--dizziness, feeling faint or lightheaded, blurry vision Shortness of breath Side effects that usually do not require medical attention (report to your care team if they continue or are bothersome): Flushing Headache Joint pain Muscle pain Nausea Pain, redness, or irritation at injection site This list may not describe all possible side effects. Call your doctor for medical advice about side effects. You may report side effects to FDA at 1-800-FDA-1088. Where should I keep my medication? This medication is given in a hospital or clinic. It will not be stored at home. NOTE: This sheet is a summary. It may not cover all possible information. If you have questions about this medicine, talk to your doctor, pharmacist, or health care provider.  2024 Elsevier/Gold Standard (2022-05-28 00:00:00)

## 2023-01-08 ENCOUNTER — Encounter: Payer: Self-pay | Admitting: Oncology

## 2023-01-08 ENCOUNTER — Other Ambulatory Visit: Payer: Self-pay | Admitting: Oncology

## 2023-01-08 DIAGNOSIS — D509 Iron deficiency anemia, unspecified: Secondary | ICD-10-CM

## 2023-01-10 ENCOUNTER — Inpatient Hospital Stay: Payer: HMO

## 2023-01-10 VITALS — BP 165/71 | HR 74 | Temp 97.7°F | Resp 18

## 2023-01-10 DIAGNOSIS — D509 Iron deficiency anemia, unspecified: Secondary | ICD-10-CM

## 2023-01-10 MED ORDER — IRON SUCROSE 20 MG/ML IV SOLN
200.0000 mg | Freq: Once | INTRAVENOUS | Status: AC
  Administered 2023-01-10: 200 mg via INTRAVENOUS
  Filled 2023-01-10: qty 10

## 2023-01-10 MED ORDER — SODIUM CHLORIDE 0.9 % IV SOLN: INTRAVENOUS | Status: DC

## 2023-01-10 NOTE — Patient Instructions (Signed)
 Iron Sucrose Injection What is this medication? IRON SUCROSE (EYE ern SOO krose) treats low levels of iron (iron deficiency anemia) in people with kidney disease. Iron is a mineral that plays an important role in making red blood cells, which carry oxygen from your lungs to the rest of your body. This medicine may be used for other purposes; ask your health care provider or pharmacist if you have questions. COMMON BRAND NAME(S): Venofer What should I tell my care team before I take this medication? They need to know if you have any of these conditions: Anemia not caused by low iron levels Heart disease High levels of iron in the blood Kidney disease Liver disease An unusual or allergic reaction to iron, other medications, foods, dyes, or preservatives Pregnant or trying to get pregnant Breastfeeding How should I use this medication? This medication is for infusion into a vein. It is given in a hospital or clinic setting. Talk to your care team about the use of this medication in children. While this medication may be prescribed for children as young as 2 years for selected conditions, precautions do apply. Overdosage: If you think you have taken too much of this medicine contact a poison control center or emergency room at once. NOTE: This medicine is only for you. Do not share this medicine with others. What if I miss a dose? Keep appointments for follow-up doses. It is important not to miss your dose. Call your care team if you are unable to keep an appointment. What may interact with this medication? Do not take this medication with any of the following: Deferoxamine Dimercaprol Other iron products This medication may also interact with the following: Chloramphenicol Deferasirox This list may not describe all possible interactions. Give your health care provider a list of all the medicines, herbs, non-prescription drugs, or dietary supplements you use. Also tell them if you smoke,  drink alcohol, or use illegal drugs. Some items may interact with your medicine. What should I watch for while using this medication? Visit your care team regularly. Tell your care team if your symptoms do not start to get better or if they get worse. You may need blood work done while you are taking this medication. You may need to follow a special diet. Talk to your care team. Foods that contain iron include: whole grains/cereals, dried fruits, beans, or peas, leafy green vegetables, and organ meats (liver, kidney). What side effects may I notice from receiving this medication? Side effects that you should report to your care team as soon as possible: Allergic reactions--skin rash, itching, hives, swelling of the face, lips, tongue, or throat Low blood pressure--dizziness, feeling faint or lightheaded, blurry vision Shortness of breath Side effects that usually do not require medical attention (report to your care team if they continue or are bothersome): Flushing Headache Joint pain Muscle pain Nausea Pain, redness, or irritation at injection site This list may not describe all possible side effects. Call your doctor for medical advice about side effects. You may report side effects to FDA at 1-800-FDA-1088. Where should I keep my medication? This medication is given in a hospital or clinic. It will not be stored at home. NOTE: This sheet is a summary. It may not cover all possible information. If you have questions about this medicine, talk to your doctor, pharmacist, or health care provider.  2024 Elsevier/Gold Standard (2022-05-28 00:00:00)

## 2023-01-11 ENCOUNTER — Inpatient Hospital Stay: Payer: HMO

## 2023-01-11 VITALS — BP 164/70 | HR 74 | Temp 98.0°F | Resp 18

## 2023-01-11 DIAGNOSIS — D509 Iron deficiency anemia, unspecified: Secondary | ICD-10-CM

## 2023-01-11 MED ORDER — IRON SUCROSE 20 MG/ML IV SOLN
200.0000 mg | Freq: Once | INTRAVENOUS | Status: AC
  Administered 2023-01-11: 200 mg via INTRAVENOUS
  Filled 2023-01-11: qty 10

## 2023-01-11 NOTE — Patient Instructions (Signed)
 Iron Sucrose Injection What is this medication? IRON SUCROSE (EYE ern SOO krose) treats low levels of iron (iron deficiency anemia) in people with kidney disease. Iron is a mineral that plays an important role in making red blood cells, which carry oxygen from your lungs to the rest of your body. This medicine may be used for other purposes; ask your health care provider or pharmacist if you have questions. COMMON BRAND NAME(S): Venofer What should I tell my care team before I take this medication? They need to know if you have any of these conditions: Anemia not caused by low iron levels Heart disease High levels of iron in the blood Kidney disease Liver disease An unusual or allergic reaction to iron, other medications, foods, dyes, or preservatives Pregnant or trying to get pregnant Breastfeeding How should I use this medication? This medication is for infusion into a vein. It is given in a hospital or clinic setting. Talk to your care team about the use of this medication in children. While this medication may be prescribed for children as young as 2 years for selected conditions, precautions do apply. Overdosage: If you think you have taken too much of this medicine contact a poison control center or emergency room at once. NOTE: This medicine is only for you. Do not share this medicine with others. What if I miss a dose? Keep appointments for follow-up doses. It is important not to miss your dose. Call your care team if you are unable to keep an appointment. What may interact with this medication? Do not take this medication with any of the following: Deferoxamine Dimercaprol Other iron products This medication may also interact with the following: Chloramphenicol Deferasirox This list may not describe all possible interactions. Give your health care provider a list of all the medicines, herbs, non-prescription drugs, or dietary supplements you use. Also tell them if you smoke,  drink alcohol, or use illegal drugs. Some items may interact with your medicine. What should I watch for while using this medication? Visit your care team regularly. Tell your care team if your symptoms do not start to get better or if they get worse. You may need blood work done while you are taking this medication. You may need to follow a special diet. Talk to your care team. Foods that contain iron include: whole grains/cereals, dried fruits, beans, or peas, leafy green vegetables, and organ meats (liver, kidney). What side effects may I notice from receiving this medication? Side effects that you should report to your care team as soon as possible: Allergic reactions--skin rash, itching, hives, swelling of the face, lips, tongue, or throat Low blood pressure--dizziness, feeling faint or lightheaded, blurry vision Shortness of breath Side effects that usually do not require medical attention (report to your care team if they continue or are bothersome): Flushing Headache Joint pain Muscle pain Nausea Pain, redness, or irritation at injection site This list may not describe all possible side effects. Call your doctor for medical advice about side effects. You may report side effects to FDA at 1-800-FDA-1088. Where should I keep my medication? This medication is given in a hospital or clinic. It will not be stored at home. NOTE: This sheet is a summary. It may not cover all possible information. If you have questions about this medicine, talk to your doctor, pharmacist, or health care provider.  2024 Elsevier/Gold Standard (2022-05-28 00:00:00)

## 2023-01-12 ENCOUNTER — Ambulatory Visit: Payer: HMO | Attending: Cardiology | Admitting: Student

## 2023-01-12 DIAGNOSIS — Z01810 Encounter for preprocedural cardiovascular examination: Secondary | ICD-10-CM

## 2023-01-12 NOTE — Progress Notes (Signed)
 Virtual Visit via Telephone Note   Because of [name]'s co-morbid illnesses, she is at least at moderate risk for complications without adequate follow up.  This format is felt to be most appropriate for this patient at this time.  The patient did not have access to video technology/had technical difficulties with video requiring transitioning to audio format only (telephone).  All issues noted in this document were discussed and addressed.  No physical exam could be performed with this format.  Please refer to the patient's chart for her consent to telehealth for [facility].  Evaluation Performed:  Preoperative cardiovascular risk assessment _____________   Date:  01/12/2023   Patient ID:  [name], DOB 05/16/1945, MRN [id number] Patient Location:  Home Provider location:   Office  Primary Care Provider:  Gable Cambric, MD Primary Cardiologist:  Redell Leiter, MD  Chief Complaint / Patient Profile   78 y.o. y/o female with a h/o SVT, hypertension, hyperlipidemia, DVT on anticoagulation, GERD, T2DM who is pending right ring finger trigger release by Dr. Murrell on 01/20/2023 and presents today for telephonic preoperative cardiovascular risk assessment.  History of Present Illness    [name] is a 78 y.o. female who presents via audio/video conferencing for a telehealth visit today.  Pt was last seen in cardiology clinic on 11/02/2022 by Dr. Leiter.  At that time [name] was stable from a cardiac standpoint.  The patient is now pending procedure as outlined above. Since her last visit, she is doing well. Patient denies shortness of breath, dyspnea on exertion, orthopnea or PND. She has chronic left lower extremity edema secondary to history of DVT.  No chest pain, pressure, or tightness. No palpitations. She is active caring for her 76-year-old grandson 3 days a week and performing light to moderate household activities.   Past Medical History    Past Medical  History:  Diagnosis Date   Anxiety    Arthritis    Chronic cough    Sinus CT ned 03-19-10; allergy eval neg 03-19-2010>>>IgE 38 pos only for grass; Singulair tiral 03-19-2010   Depression    Diabetes mellitus without complication (HCC)    DVT (deep venous thrombosis) (HCC)    GERD (gastroesophageal reflux disease)    Hilar adenopathy    CT 02-24-2010   Hyperlipidemia    Hypertension    Other and unspecified hyperlipidemia    Other pulmonary embolism and infarction    Pneumonia, organism unspecified(486)    Pulmonary embolism (HCC)    Unspecified asthma(493.90)    Vitamin B12 deficiency    Past Surgical History:  Procedure Laterality Date   CATARACT EXTRACTION Right    KNEE ARTHROSCOPY     bilateral   left knee replacement  01/05/2008   left shoulder  01/04/1993   NASAL SINUS SURGERY  age 37   TONSILLECTOMY  66   TUBAL LIGATION  age 2    Allergies  Allergies  Allergen Reactions   Gabapentin    Prednisone     REACTION: mood swings    Home Medications    Prior to Admission medications   Medication Sig Start Date End Date Taking? Authorizing Provider  apixaban (ELIQUIS) 5 MG TABS tablet Take 5 mg by mouth daily.    [provider]  budesonide-formoterol (SYMBICORT) 160-4.5 MCG/ACT inhaler Inhale 2 puffs into the lungs 2 (two) times daily.    [provider]  carvedilol (COREG) 3.125 MG tablet Take 3.125 mg by mouth 2 (two) times daily with  a meal.    [provider]  Cetirizine HCl (ZYRTEC PO) Take 1 tablet by mouth daily.    [provider]  citalopram (CELEXA) 20 MG tablet Take 20 mg by mouth daily.    [provider]  cyanocobalamin  (VITAMIN B12) 1000 MCG tablet Take 1,000 mcg by mouth every 30 (thirty) days. Patient not taking: Reported on 11/02/2022    [provider]  cyclobenzaprine (FLEXERIL) 10 MG tablet Take 10 mg by mouth 2 (two) times daily as needed for muscle spasms.    [provider]   Diclofenac Sodium 3 % GEL Apply 1 Application topically daily as needed (pain).    [provider]  ergocalciferol (VITAMIN D2) 1.25 MG (50000 UT) capsule Take 50,000 Units by mouth once a week.    [provider]  fenofibrate micronized (LOFIBRA) 134 MG capsule Take 134 mg by mouth daily before breakfast.    [provider]  fluticasone (FLONASE) 50 MCG/ACT nasal spray Place 2 sprays into both nostrils daily.    [provider]  furosemide (LASIX) 40 MG tablet Take 40 mg by mouth 2 (two) times daily.    [provider]  LORazepam (ATIVAN) 0.5 MG tablet Take 0.75 mg by mouth at bedtime. 01/14/22   [provider]  losartan (COZAAR) 50 MG tablet Take 50 mg by mouth daily.    [provider]  metFORMIN (GLUCOPHAGE) 500 MG tablet Take 500 mg by mouth daily with breakfast. And 250 mg every evening 05/01/15   [provider]  montelukast (SINGULAIR) 10 MG tablet Take 10 mg by mouth at bedtime.    [provider]  pantoprazole  (PROTONIX ) 20 MG tablet Take 20 mg by mouth daily.    [provider]  Potassium Chloride CR (MICRO-K) 8 MEQ CPCR capsule CR Take 8 mEq by mouth as needed (leg cramps).    [provider]  rosuvastatin (CRESTOR) 5 MG tablet Take 2.5 mg by mouth 2 (two) times a week.    [provider]  traMADol  (ULTRAM ) 50 MG tablet Take 50 mg by mouth 2 (two) times daily as needed for moderate pain.    [provider]    Physical Exam    Vital Signs:  [name] does not have vital signs available for review today.  Given telephonic nature of communication, physical exam is limited. AAOx3. NAD. Normal affect.  Speech and respirations are unlabored.  Accessory Clinical Findings    None  Assessment & Plan    Primary Cardiologist: Redell Leiter, MD  Preoperative cardiovascular risk assessment.  Right ring finger trigger release by Dr. Murrell on 01/20/2023.  Chart reviewed  as part of pre-operative protocol coverage. According to the RCRI, patient has a 0.4% risk of MACE. Patient reports activity equivalent to >4.0 METS (taking care of 45-year-old grandson three days a week and performing light to moderate household activities).   Given past medical history and time since last visit, based on ACC/AHA guidelines, Salimah Kukuk would be at acceptable risk for the planned procedure without further cardiovascular testing.   Patient was advised that if she develops new symptoms prior to surgery to contact our office to arrange a follow-up appointment.  she verbalized understanding.  Per Pharm D, patient may hold Eliquis for 1-2 days prior to procedure.    I will route this recommendation to the requesting party via Epic fax function.  Please call with questions.  Time:   Today, I have spent 8 minutes with  the patient with telehealth technology discussing medical history, symptoms, and management plan.     Barnie Hila, NP  01/12/2023, 8:13 AM

## 2023-01-13 ENCOUNTER — Other Ambulatory Visit: Payer: Self-pay

## 2023-01-13 ENCOUNTER — Ambulatory Visit: Payer: Medicare PPO

## 2023-01-13 ENCOUNTER — Encounter (HOSPITAL_BASED_OUTPATIENT_CLINIC_OR_DEPARTMENT_OTHER): Payer: Self-pay | Admitting: Orthopedic Surgery

## 2023-01-13 DIAGNOSIS — E669 Obesity, unspecified: Secondary | ICD-10-CM | POA: Diagnosis not present

## 2023-01-13 DIAGNOSIS — M7989 Other specified soft tissue disorders: Secondary | ICD-10-CM | POA: Diagnosis not present

## 2023-01-13 DIAGNOSIS — E785 Hyperlipidemia, unspecified: Secondary | ICD-10-CM | POA: Diagnosis not present

## 2023-01-13 DIAGNOSIS — F419 Anxiety disorder, unspecified: Secondary | ICD-10-CM | POA: Diagnosis not present

## 2023-01-13 DIAGNOSIS — Z6832 Body mass index (BMI) 32.0-32.9, adult: Secondary | ICD-10-CM | POA: Diagnosis not present

## 2023-01-13 DIAGNOSIS — E538 Deficiency of other specified B group vitamins: Secondary | ICD-10-CM | POA: Diagnosis not present

## 2023-01-13 NOTE — Progress Notes (Signed)
   01/13/23 1414  PAT Phone Screen  Is the patient taking a GLP-1 receptor agonist? No  Do You Have Diabetes? (S)  Yes  Do You Have Hypertension? (S)  Yes  Have You Ever Been to the ER for Asthma? No  Have You Taken Oral Steroids in the Past 3 Months? No  Do you Take Phenteramine or any Other Diet Drugs? No  Recent  Lab Work, EKG, CXR? (S)  Yes  Where was this test performed? (S)  [facility] 12/20  Do you have a history of heart problems? (S)  Yes  Cardiologist Name (S)  Primary Cardiologist Dr Monetta- last OV 11/02/22. Clearance and eliquis instructions received and placed on chart in pre op.  Have you ever had tests on your heart? (S)  Yes  What cardiac tests were performed? (S)  Echo  What date/year were cardiac tests completed? (S)  12/10/21 EF 55-60% no stenosis.  Results viewable: (S)  CH[facility] Media Tab  Any Recent Hospitalizations? No  Height 5' 5 (1.651 m)  Weight 86.2 kg  Pat Appointment Scheduled (S)  Yes (ekg)

## 2023-01-14 ENCOUNTER — Inpatient Hospital Stay: Payer: HMO

## 2023-01-18 ENCOUNTER — Inpatient Hospital Stay: Payer: HMO

## 2023-01-18 VITALS — BP 178/48 | HR 71 | Temp 97.8°F | Resp 18

## 2023-01-18 DIAGNOSIS — D509 Iron deficiency anemia, unspecified: Secondary | ICD-10-CM

## 2023-01-18 MED ORDER — SODIUM CHLORIDE 0.9 % IV SOLN
INTRAVENOUS | Status: DC
Start: 2023-01-18 — End: 2023-01-18

## 2023-01-18 MED ORDER — IRON SUCROSE 20 MG/ML IV SOLN
200.0000 mg | Freq: Once | INTRAVENOUS | Status: AC
  Administered 2023-01-18: 200 mg via INTRAVENOUS
  Filled 2023-01-18: qty 10

## 2023-01-18 NOTE — Patient Instructions (Signed)
 Iron Sucrose Injection What is this medication? IRON SUCROSE (EYE ern SOO krose) treats low levels of iron (iron deficiency anemia) in people with kidney disease. Iron is a mineral that plays an important role in making red blood cells, which carry oxygen from your lungs to the rest of your body. This medicine may be used for other purposes; ask your health care provider or pharmacist if you have questions. COMMON BRAND NAME(S): Venofer What should I tell my care team before I take this medication? They need to know if you have any of these conditions: Anemia not caused by low iron levels Heart disease High levels of iron in the blood Kidney disease Liver disease An unusual or allergic reaction to iron, other medications, foods, dyes, or preservatives Pregnant or trying to get pregnant Breastfeeding How should I use this medication? This medication is for infusion into a vein. It is given in a hospital or clinic setting. Talk to your care team about the use of this medication in children. While this medication may be prescribed for children as young as 2 years for selected conditions, precautions do apply. Overdosage: If you think you have taken too much of this medicine contact a poison control center or emergency room at once. NOTE: This medicine is only for you. Do not share this medicine with others. What if I miss a dose? Keep appointments for follow-up doses. It is important not to miss your dose. Call your care team if you are unable to keep an appointment. What may interact with this medication? Do not take this medication with any of the following: Deferoxamine Dimercaprol Other iron products This medication may also interact with the following: Chloramphenicol Deferasirox This list may not describe all possible interactions. Give your health care provider a list of all the medicines, herbs, non-prescription drugs, or dietary supplements you use. Also tell them if you smoke,  drink alcohol, or use illegal drugs. Some items may interact with your medicine. What should I watch for while using this medication? Visit your care team regularly. Tell your care team if your symptoms do not start to get better or if they get worse. You may need blood work done while you are taking this medication. You may need to follow a special diet. Talk to your care team. Foods that contain iron include: whole grains/cereals, dried fruits, beans, or peas, leafy green vegetables, and organ meats (liver, kidney). What side effects may I notice from receiving this medication? Side effects that you should report to your care team as soon as possible: Allergic reactions--skin rash, itching, hives, swelling of the face, lips, tongue, or throat Low blood pressure--dizziness, feeling faint or lightheaded, blurry vision Shortness of breath Side effects that usually do not require medical attention (report to your care team if they continue or are bothersome): Flushing Headache Joint pain Muscle pain Nausea Pain, redness, or irritation at injection site This list may not describe all possible side effects. Call your doctor for medical advice about side effects. You may report side effects to FDA at 1-800-FDA-1088. Where should I keep my medication? This medication is given in a hospital or clinic. It will not be stored at home. NOTE: This sheet is a summary. It may not cover all possible information. If you have questions about this medicine, talk to your doctor, pharmacist, or health care provider.  2024 Elsevier/Gold Standard (2022-05-28 00:00:00)

## 2023-01-20 ENCOUNTER — Encounter (HOSPITAL_BASED_OUTPATIENT_CLINIC_OR_DEPARTMENT_OTHER)
Admission: RE | Disposition: A | Discharge status: Home / Self Care | Payer: Self-pay | Source: Home / Self Care | Attending: Orthopedic Surgery

## 2023-01-20 ENCOUNTER — Ambulatory Visit (HOSPITAL_BASED_OUTPATIENT_CLINIC_OR_DEPARTMENT_OTHER): Payer: HMO | Admitting: Anesthesiology

## 2023-01-20 ENCOUNTER — Ambulatory Visit (HOSPITAL_BASED_OUTPATIENT_CLINIC_OR_DEPARTMENT_OTHER)
Admission: RE | Admit: 2023-01-20 | Discharge: 2023-01-20 | Disposition: A | Discharge status: Home / Self Care | Payer: HMO | Attending: Orthopedic Surgery | Admitting: Orthopedic Surgery

## 2023-01-20 ENCOUNTER — Ambulatory Visit: Payer: Medicare PPO

## 2023-01-20 ENCOUNTER — Encounter (HOSPITAL_BASED_OUTPATIENT_CLINIC_OR_DEPARTMENT_OTHER): Payer: Self-pay | Admitting: Orthopedic Surgery

## 2023-01-20 ENCOUNTER — Other Ambulatory Visit: Payer: Self-pay

## 2023-01-20 DIAGNOSIS — Z7984 Long term (current) use of oral hypoglycemic drugs: Secondary | ICD-10-CM | POA: Insufficient documentation

## 2023-01-20 DIAGNOSIS — I10 Essential (primary) hypertension: Secondary | ICD-10-CM

## 2023-01-20 DIAGNOSIS — M65341 Trigger finger, right ring finger: Secondary | ICD-10-CM

## 2023-01-20 DIAGNOSIS — E119 Type 2 diabetes mellitus without complications: Secondary | ICD-10-CM | POA: Insufficient documentation

## 2023-01-20 DIAGNOSIS — J45909 Unspecified asthma, uncomplicated: Secondary | ICD-10-CM | POA: Diagnosis not present

## 2023-01-20 DIAGNOSIS — Z79899 Other long term (current) drug therapy: Secondary | ICD-10-CM | POA: Insufficient documentation

## 2023-01-20 DIAGNOSIS — F32A Depression, unspecified: Secondary | ICD-10-CM

## 2023-01-20 DIAGNOSIS — F419 Anxiety disorder, unspecified: Secondary | ICD-10-CM | POA: Diagnosis not present

## 2023-01-20 DIAGNOSIS — E785 Hyperlipidemia, unspecified: Secondary | ICD-10-CM

## 2023-01-20 DIAGNOSIS — K219 Gastro-esophageal reflux disease without esophagitis: Secondary | ICD-10-CM | POA: Diagnosis not present

## 2023-01-20 HISTORY — PX: TRIGGER FINGER RELEASE: SHX641

## 2023-01-20 LAB — GLUCOSE, CAPILLARY
Glucose-Capillary: 83 mg/dL (ref 70–99)
Glucose-Capillary: 93 mg/dL (ref 70–99)

## 2023-01-20 SURGERY — RELEASE, A1 PULLEY, FOR TRIGGER FINGER
Anesthesia: Monitor Anesthesia Care | Site: Finger | Laterality: Right

## 2023-01-20 MED ORDER — SODIUM CHLORIDE 0.9 % IV SOLN: INTRAVENOUS | Status: DC | PRN

## 2023-01-20 MED ORDER — LACTATED RINGERS IV SOLN
INTRAVENOUS | Status: DC
Start: 2023-01-20 — End: 2023-01-20

## 2023-01-20 MED ORDER — CEFAZOLIN SODIUM-DEXTROSE 2-4 GM/100ML-% IV SOLN
2.0000 g | INTRAVENOUS | Status: AC
  Administered 2023-01-20: 2 g via INTRAVENOUS

## 2023-01-20 MED ORDER — OXYCODONE HCL 5 MG/5ML PO SOLN
5.0000 mg | Freq: Once | ORAL | Status: DC | PRN
Start: 2023-01-20 — End: 2023-01-20

## 2023-01-20 MED ORDER — DEXMEDETOMIDINE HCL IN NACL 80 MCG/20ML IV SOLN
INTRAVENOUS | Status: DC | PRN
  Administered 2023-01-20 (×2): 8 ug via INTRAVENOUS

## 2023-01-20 MED ORDER — ACETAMINOPHEN 10 MG/ML IV SOLN
1000.0000 mg | Freq: Once | INTRAVENOUS | Status: DC | PRN
Start: 2023-01-20 — End: 2023-01-20

## 2023-01-20 MED ORDER — FENTANYL CITRATE (PF) 100 MCG/2ML IJ SOLN
INTRAMUSCULAR | Status: DC | PRN
  Administered 2023-01-20: 25 ug via INTRAVENOUS

## 2023-01-20 MED ORDER — FENTANYL CITRATE (PF) 100 MCG/2ML IJ SOLN
INTRAMUSCULAR | Status: AC
  Filled 2023-01-20: qty 2

## 2023-01-20 MED ORDER — PROPOFOL 500 MG/50ML IV EMUL
INTRAVENOUS | Status: DC | PRN
  Administered 2023-01-20: 40 mg via INTRAVENOUS
  Administered 2023-01-20: 80 mg via INTRAVENOUS
  Administered 2023-01-20: 40 mg via INTRAVENOUS

## 2023-01-20 MED ORDER — BUPIVACAINE HCL (PF) 0.25 % IJ SOLN
INTRAMUSCULAR | Status: DC | PRN
  Administered 2023-01-20: 7 mL

## 2023-01-20 MED ORDER — ONDANSETRON HCL 4 MG/2ML IJ SOLN
INTRAMUSCULAR | Status: AC
  Filled 2023-01-20: qty 2

## 2023-01-20 MED ORDER — TRAMADOL HCL 50 MG PO TABS: 50.0000 mg | ORAL_TABLET | Freq: Four times a day (QID) | ORAL | 0 refills | Status: AC | PRN

## 2023-01-20 MED ORDER — CEFAZOLIN SODIUM-DEXTROSE 2-4 GM/100ML-% IV SOLN
INTRAVENOUS | Status: AC
  Filled 2023-01-20: qty 100

## 2023-01-20 MED ORDER — OXYCODONE HCL 5 MG PO TABS: 5.0000 mg | ORAL_TABLET | Freq: Once | ORAL | Status: DC | PRN

## 2023-01-20 MED ORDER — LIDOCAINE 2% (20 MG/ML) 5 ML SYRINGE
INTRAMUSCULAR | Status: AC
  Filled 2023-01-20: qty 5

## 2023-01-20 MED ORDER — LIDOCAINE HCL (PF) 2 % IJ SOLN
INTRAMUSCULAR | Status: DC | PRN
  Administered 2023-01-20: 20 mL via INTRADERMAL

## 2023-01-20 MED ORDER — MIDAZOLAM HCL 2 MG/2ML IJ SOLN
INTRAMUSCULAR | Status: AC
  Filled 2023-01-20: qty 2

## 2023-01-20 MED ORDER — FENTANYL CITRATE (PF) 100 MCG/2ML IJ SOLN
50.0000 ug | Freq: Once | INTRAMUSCULAR | Status: AC
  Administered 2023-01-20: 50 ug via INTRAVENOUS

## 2023-01-20 MED ORDER — FENTANYL CITRATE (PF) 100 MCG/2ML IJ SOLN: 25.0000 ug | INTRAMUSCULAR | Status: DC | PRN

## 2023-01-20 MED ORDER — ONDANSETRON HCL 4 MG/2ML IJ SOLN
4.0000 mg | Freq: Once | INTRAMUSCULAR | Status: AC | PRN
  Administered 2023-01-20: 4 mg via INTRAVENOUS

## 2023-01-20 SURGICAL SUPPLY — 26 items
BLADE SURG 15 STRL LF DISP TIS (BLADE) ×2 IMPLANT
BNDG COHESIVE 2X5 TAN ST LF (GAUZE/BANDAGES/DRESSINGS) ×1 IMPLANT
BNDG ESMARK 4X9 LF (GAUZE/BANDAGES/DRESSINGS) IMPLANT
CHLORAPREP W/TINT 26 (MISCELLANEOUS) ×1 IMPLANT
CORD BIPOLAR FORCEPS 12FT (ELECTRODE) ×1 IMPLANT
COVER BACK TABLE 60X90IN (DRAPES) ×1 IMPLANT
COVER MAYO STAND STRL (DRAPES) ×1 IMPLANT
CUFF TOURN SGL QUICK 18X4 (TOURNIQUET CUFF) ×1 IMPLANT
DRAPE EXTREMITY T 121X128X90 (DISPOSABLE) ×1 IMPLANT
DRAPE SURG 17X23 STRL (DRAPES) ×1 IMPLANT
GAUZE SPONGE 4X4 12PLY STRL (GAUZE/BANDAGES/DRESSINGS) ×1 IMPLANT
GAUZE XEROFORM 1X8 LF (GAUZE/BANDAGES/DRESSINGS) ×1 IMPLANT
GLOVE BIO SURGEON STRL SZ7.5 (GLOVE) ×1 IMPLANT
GLOVE BIOGEL PI IND STRL 8 (GLOVE) ×1 IMPLANT
GOWN STRL REUS W/ TWL LRG LVL3 (GOWN DISPOSABLE) ×1 IMPLANT
GOWN STRL REUS W/TWL XL LVL3 (GOWN DISPOSABLE) ×1 IMPLANT
NDL HYPO 25X1 1.5 SAFETY (NEEDLE) ×1 IMPLANT
NEEDLE HYPO 25X1 1.5 SAFETY (NEEDLE) ×1
NS IRRIG 1000ML POUR BTL (IV SOLUTION) ×1 IMPLANT
PACK BASIN DAY SURGERY FS (CUSTOM PROCEDURE TRAY) ×1 IMPLANT
STOCKINETTE 4X48 STRL (DRAPES) ×1 IMPLANT
SUT ETHILON 4 0 PS 2 18 (SUTURE) ×1 IMPLANT
SYR BULB EAR ULCER 3OZ GRN STR (SYRINGE) ×1 IMPLANT
SYR CONTROL 10ML LL (SYRINGE) ×1 IMPLANT
TOWEL GREEN STERILE FF (TOWEL DISPOSABLE) ×2 IMPLANT
UNDERPAD 30X36 HEAVY ABSORB (UNDERPADS AND DIAPERS) ×1 IMPLANT

## 2023-01-20 NOTE — Anesthesia Procedure Notes (Signed)
Procedure Name: MAC Date/Time: 01/20/2023 9:49 AM  Performed by: Yolanda Bonine, CRNAPre-anesthesia Checklist: Patient identified, Emergency Drugs available, Suction available, Patient being monitored and Timeout performed Oxygen Delivery Method: Simple face mask Airway Equipment and Method: Oral airway Placement Confirmation: positive ETCO2 Dental Injury: Teeth and Oropharynx as per pre-operative assessment

## 2023-01-20 NOTE — Op Note (Signed)
01/20/2023 [facility]  Operative Note  PREOPERATIVE DIAGNOSIS: RIGHT RING FINGER TRIGGER RELEASE  POSTOPERATIVE DIAGNOSIS:  RIGHT RING FINGER TRIGGER RELEASE  PROCEDURE: Procedure(s): RIGHT RING FINGER TRIGGER RELEASE   SURGEON:  [name], MD  ASSISTANT:  none.  ANESTHESIA:  Regional with sedation.  IV FLUIDS:  Per anesthesia flow sheet.  ESTIMATED BLOOD LOSS:  Minimal.  COMPLICATIONS:  None.  SPECIMENS:  None.  TOURNIQUET TIME:  Total Tourniquet Time Documented: Forearm (Right) - 7 minutes Total: Forearm (Right) - 7 minutes   DISPOSITION:  Stable to PACU.  LOCATION: [facility]  INDICATIONS: [name] is a 78 y.o. female with triggering of the ring finger.  This has been injected without lasting resolution.  She wishes to proceed with surgical trigger release.  Risks, benefits and alternatives of surgery were discussed including the risk of blood loss, infection, damage to nerves, vessels, tendons, ligaments, bone, failure of surgery, need for additional surgery, complications with wound healing, continued pain, continued triggering and need for repeat surgery.  She voiced understanding of these risks and elected to proceed.  OPERATIVE COURSE:  After being identified preoperatively by myself, the patient and I agreed upon the procedure and site of procedure.  The surgical site was marked. Surgical consent had been signed. She was given IV Ancef as preoperative antibiotic prophylaxis. She was transported to the operating room and placed on the operating room table in supine position with the Right upper extremity on an arm board. A regional block had been performed by anesthesia in preoperative holding.    The Right upper extremity was prepped and draped in normal sterile orthopedic fashion. A surgical pause was performed between surgeons, anesthesia, and operating room staff, and all were in agreement as to the patient, procedure, and site of  procedure.  Tourniquet at the proximal aspect of the forearm was inflated to 250 mmHg after exsanguination of the arm with an Esmarch bandage.  An incision was made at the volar aspect of the MP joint of the ring finger.  This was carried into the subcutaneous tissues by spreading technique.  Bipolar electrocautery was used to obtain hemostasis.  The radial and ulnar digital nerves were protected throughout the case. The flexor sheath was identified.  The A1 pulley was identified and sharply incised.  It was released in its entirety.  The proximal 1-2 mm of the A2 pulley was vented to allow better excursion of the tendons.  The finger was placed through a range of motion and there was noted to be no catching.  The tendons were brought through the wound and any adherences released.  The wound was then copiously irrigated with sterile saline. It was closed with 4-0 nylon in a horizontal mattress fashion.  It was injected with 0.25% plain Marcaine to aid in postoperative analgesia.  It was dressed with sterile Xeroform, 4x4s, and wrapped lightly with a Coban dressing.  Tourniquet was deflated at 7 minutes.  The fingertips were pink with brisk capillary refill after deflation of the tourniquet.  The operative drapes were broken down and the patient was awoken from anesthesia safely.  She was transferred back to the stretcher and taken to the PACU in stable condition.   I will see her back in the office in 1 week for postoperative followup.  I will give her a prescription for Tramadol 50 mg 1 tab PO q6 hours prn pain, dispense # 20.    [name], MD Electronically signed, 01/20/23

## 2023-01-20 NOTE — Transfer of Care (Signed)
Immediate Anesthesia Transfer of Care Note  Patient: [name]  Procedure(s) Performed: RIGHT RING FINGER TRIGGER RELEASE (Right: Finger)  Patient Location: PACU  Anesthesia Type:MAC  Level of Consciousness: awake, drowsy, and patient cooperative  Airway & Oxygen Therapy: Patient Spontanous Breathing and Patient connected to face mask oxygen  Post-op Assessment: Report given to RN and Post -op Vital signs reviewed and stable  Post vital signs: Reviewed and stable  Last Vitals:  Vitals Value Taken Time  BP 152/56 01/20/23 1023  Temp 36.6 C 01/20/23 1023  Pulse 75 01/20/23 1028  Resp 16 01/20/23 1028  SpO2 99 % 01/20/23 1028  Vitals shown include unfiled device data.  Last Pain:  Vitals:   01/20/23 1023  TempSrc:   PainSc: 0-No pain      Patients Stated Pain Goal: 3 (01/20/23 0803)  Complications: No notable events documented.

## 2023-01-20 NOTE — Discharge Instructions (Addendum)
 [facility] Instructions Hand Surgery  Wound Care: Keep your hand elevated above the level of your heart.  Do not allow it to dangle by your side.  Keep the dressing dry and do not remove it unless your doctor advises you to do so.  He will usually change it at the time of your post-op visit.  Moving your fingers is advised to stimulate circulation but will depend on the site of your surgery.  If you have a splint applied, your doctor will advise you regarding movement.  Activity: Do not drive or operate machinery today.  Rest today and then you may return to your normal activity and work as indicated by your physician.  Diet:  Drink liquids today or eat a light diet.  You may resume a regular diet tomorrow.    General expectations: Pain for two to three days. Fingers may become slightly swollen.  Call your doctor if any of the following occur: Severe pain not relieved by pain medication. Elevated temperature. Dressing soaked with blood. Inability to move fingers. White or bluish color to fingers.    Post Anesthesia Home Care Instructions  Activity: Get plenty of rest for the remainder of the day. A responsible individual must stay with you for 24 hours following the procedure.  For the next 24 hours, DO NOT: -Drive a car -Advertising copywriter -Drink alcoholic beverages -Take any medication unless instructed by your physician -Make any legal decisions or sign important papers.  Meals: Start with liquid foods such as gelatin or soup. Progress to regular foods as tolerated. Avoid greasy, spicy, heavy foods. If nausea and/or vomiting occur, drink only clear liquids until the nausea and/or vomiting subsides. Call your physician if vomiting continues.  Special Instructions/Symptoms: Your throat may feel dry or sore from the anesthesia or the breathing tube placed in your throat during surgery. If this causes discomfort, gargle with warm salt water. The discomfort should disappear  within 24 hours.  If you had a scopolamine patch placed behind your ear for the management of post- operative nausea and/or vomiting:  1. The medication in the patch is effective for 72 hours, after which it should be removed.  Wrap patch in a tissue and discard in the trash. Wash hands thoroughly with soap and water. 2. You may remove the patch earlier than 72 hours if you experience unpleasant side effects which may include dry mouth, dizziness or visual disturbances. 3. Avoid touching the patch. Wash your hands with soap and water after contact with the patch.   Regional Anesthesia Blocks  1. You may not be able to move or feel the "blocked" extremity after a regional anesthetic block. This may last may last from 3-48 hours after placement, but it will go away. The length of time depends on the medication injected and your individual response to the medication. As the nerves start to wake up, you may experience tingling as the movement and feeling returns to your extremity. If the numbness and inability to move your extremity has not gone away after 48 hours, please call your surgeon.   2. The extremity that is blocked will need to be protected until the numbness is gone and the strength has returned. Because you cannot feel it, you will need to take extra care to avoid injury. Because it may be weak, you may have difficulty moving it or using it. You may not know what position it is in without looking at it while the block is in effect.  3. For blocks in the legs and feet, returning to weight bearing and walking needs to be done carefully. You will need to wait until the numbness is entirely gone and the strength has returned. You should be able to move your leg and foot normally before you try and bear weight or walk. You will need someone to be with you when you first try to ensure you do not fall and possibly risk injury.  4. Bruising and tenderness at the needle site are common side effects  and will resolve in a few days.  5. Persistent numbness or new problems with movement should be communicated to the surgeon or the [facility] [phone] [facility] [phone]).

## 2023-01-20 NOTE — Anesthesia Preprocedure Evaluation (Addendum)
Anesthesia Evaluation  Patient identified by MRN, date of birth, ID band Patient awake    Reviewed: Allergy & Precautions, NPO status , Patient's Chart, lab work & pertinent test results, reviewed documented beta blocker date and time   History of Anesthesia Complications Negative for: history of anesthetic complications  Airway Mallampati: III  TM Distance: >3 FB Neck ROM: Full    Dental no notable dental hx.    Pulmonary asthma , neg COPD, PE   breath sounds clear to auscultation       Cardiovascular hypertension, (-) angina (-) CAD and (-) Past MI + dysrhythmias  Rhythm:Regular Rate:Normal     Neuro/Psych neg Seizures PSYCHIATRIC DISORDERS Anxiety Depression       GI/Hepatic ,GERD  Medicated,,(+) neg Cirrhosis        Endo/Other  diabetes    Renal/GU Renal disease     Musculoskeletal  (+) Arthritis ,    Abdominal   Peds  Hematology  (+) Blood dyscrasia, anemia   Anesthesia Other Findings   Reproductive/Obstetrics                              Anesthesia Physical Anesthesia Plan  ASA: 3  Anesthesia Plan: Regional and MAC   Post-op Pain Management:    Induction: Intravenous  PONV Risk Score and Plan: 2 and Ondansetron and Propofol infusion  Airway Management Planned: Natural Airway and Nasal Cannula  Additional Equipment:   Intra-op Plan:   Post-operative Plan:   Informed Consent: I have reviewed the patients History and Physical, chart, labs and discussed the procedure including the risks, benefits and alternatives for the proposed anesthesia with the patient or authorized representative who has indicated his/her understanding and acceptance.     Dental advisory given  Plan Discussed with: CRNA  Anesthesia Plan Comments:          Anesthesia Quick Evaluation

## 2023-01-20 NOTE — Anesthesia Postprocedure Evaluation (Signed)
Anesthesia Post Note  Patient: [name]  Procedure(s) Performed: RIGHT RING FINGER TRIGGER RELEASE (Right: Finger)     Patient location during evaluation: PACU Anesthesia Type: Regional Level of consciousness: awake and alert Pain management: pain level controlled Vital Signs Assessment: post-procedure vital signs reviewed and stable Respiratory status: spontaneous breathing, nonlabored ventilation, respiratory function stable and patient connected to nasal cannula oxygen Cardiovascular status: stable and blood pressure returned to baseline Postop Assessment: no apparent nausea or vomiting Anesthetic complications: no   No notable events documented.  Last Vitals:  Vitals:   01/20/23 1045 01/20/23 1115  BP: (!) 160/53 (!) 174/61  Pulse: 71 70  Resp: 14 16  Temp:  (!) 36.3 C  SpO2: 93% 95%    Last Pain:  Vitals:   01/20/23 1115  TempSrc: Oral  PainSc: 0-No pain                 Mariann Barter

## 2023-01-20 NOTE — Anesthesia Procedure Notes (Signed)
Anesthesia Regional Block: Axillary brachial plexus block   Pre-Anesthetic Checklist: , timeout performed,  Correct Patient, Correct Site, Correct Laterality,  Correct Procedure, Correct Position, site marked,  Risks and benefits discussed,  Surgical consent,  Pre-op evaluation,  At surgeon's request and post-op pain management  Laterality: Right  Prep: Maximum Sterile Barrier Precautions used, chloraprep       Needles:  Injection technique: Single-shot  Needle Type: Echogenic Needle     Needle Length: 5cm  Needle Gauge: 22     Additional Needles:   Procedures:,,,, ultrasound used (permanent image in chart),,    Narrative:  Start time: 01/20/2023 9:05 AM End time: 01/20/2023 9:10 AM Injection made incrementally with aspirations every 5 mL.  Performed by: Personally  Anesthesiologist: Mariann Barter, MD

## 2023-01-20 NOTE — H&P (Signed)
[name] is an 78 y.o. female.   Chief Complaint: trigger digit HPI: 78 yo female with triggering right ring finger.  This has been injected without lasting resolution.  She wishes to proceed with right ring finger trigger release.  Allergies:  Allergies  Allergen Reactions   Gabapentin    Prednisone     REACTION: mood swings    Past Medical History:  Diagnosis Date   Anxiety    Arthritis    Chronic cough    Sinus CT ned 03-19-10; allergy eval neg 03-19-2010>>>IgE 38 pos only for grass; Singulair tiral 03-19-2010   Depression    Diabetes mellitus without complication (HCC)    DVT (deep venous thrombosis) (HCC)    GERD (gastroesophageal reflux disease)    Hilar adenopathy    CT 02-24-2010   Hyperlipidemia    Hypertension    Other and unspecified hyperlipidemia    Other pulmonary embolism and infarction    Pneumonia, organism unspecified(486)    Pulmonary embolism (HCC)    Unspecified asthma(493.90)    Vitamin B12 deficiency     Past Surgical History:  Procedure Laterality Date   CATARACT EXTRACTION Right    KNEE ARTHROSCOPY     bilateral   left knee replacement  01/05/2008   left shoulder  01/04/1993   NASAL SINUS SURGERY  age 76   TONSILLECTOMY  79   TUBAL LIGATION  age 65    Family History: Family History  Problem Relation Age of Onset   Rheum arthritis Mother    Hypertension Mother    Hyperlipidemia Mother    Heart disease Mother    Cancer Brother    Colon cancer Cousin    Breast cancer Cousin     Social History:   reports that she has never smoked. She has never used smokeless tobacco. She reports that she does not drink alcohol and does not use drugs.  Medications: Medications Prior to Admission  Medication Sig Dispense Refill   apixaban (ELIQUIS) 5 MG TABS tablet Take 5 mg by mouth daily.     carvedilol (COREG) 3.125 MG tablet Take 3.125 mg by mouth 2 (two) times daily with a meal.     Cetirizine HCl (ZYRTEC PO) Take 1 tablet by mouth daily.      citalopram (CELEXA) 20 MG tablet Take 20 mg by mouth daily.     cyanocobalamin (VITAMIN B12) 1000 MCG tablet Take 1,000 mcg by mouth every 30 (thirty) days.     cyclobenzaprine (FLEXERIL) 10 MG tablet Take 10 mg by mouth 2 (two) times daily as needed for muscle spasms.     Diclofenac Sodium 3 % GEL Apply 1 Application topically daily as needed (pain).     ergocalciferol (VITAMIN D2) 1.25 MG (50000 UT) capsule Take 50,000 Units by mouth once a week.     furosemide (LASIX) 40 MG tablet Take 40 mg by mouth 2 (two) times daily.     LORazepam (ATIVAN) 0.5 MG tablet Take 0.75 mg by mouth at bedtime.     losartan (COZAAR) 50 MG tablet Take 50 mg by mouth daily.     metFORMIN (GLUCOPHAGE) 500 MG tablet Take 500 mg by mouth daily with breakfast. And 250 mg every evening     montelukast (SINGULAIR) 10 MG tablet Take 10 mg by mouth at bedtime.     pantoprazole (PROTONIX) 20 MG tablet Take 20 mg by mouth daily.     Potassium Chloride CR (MICRO-K) 8 MEQ CPCR capsule CR Take 8 mEq by  mouth as needed (leg cramps).     rosuvastatin (CRESTOR) 5 MG tablet Take 2.5 mg by mouth 2 (two) times a week.     traMADol (ULTRAM) 50 MG tablet Take 50 mg by mouth 2 (two) times daily as needed for moderate pain.     budesonide-formoterol (SYMBICORT) 160-4.5 MCG/ACT inhaler Inhale 2 puffs into the lungs 2 (two) times daily.     fenofibrate micronized (LOFIBRA) 134 MG capsule Take 134 mg by mouth daily before breakfast.     fluticasone (FLONASE) 50 MCG/ACT nasal spray Place 2 sprays into both nostrils daily.      Results for orders placed or performed during the hospital encounter of 01/20/23 (from the past 48 hours)  Glucose, capillary     Status: None   Collection Time: 01/20/23  8:01 AM  Result Value Ref Range   Glucose-Capillary 83 70 - 99 mg/dL    Comment: Glucose reference range applies only to samples taken after fasting for at least 8 hours.    No results found.    Blood pressure (!) 155/66, pulse 72,  temperature 97.8 F (36.6 C), temperature source Temporal, resp. rate 16, height 5\' 5"  (1.651 m), weight 85 kg, SpO2 98%.  General appearance: alert, cooperative, and appears stated age Head: Normocephalic, without obvious abnormality, atraumatic Neck: supple, symmetrical, trachea midline Extremities: Intact sensation and capillary refill all digits.  +epl/fpl/io.  No wounds.  Skin: Skin color, texture, turgor normal. No rashes or lesions Neurologic: Grossly normal Incision/Wound: none  Assessment/Plan Right ring finger trigger digit.  Non operative and operative treatment options have been discussed with the patient and patient wishes to proceed with operative treatment. Risks, benefits, and alternatives of surgery have been discussed and the patient agrees with the plan of care.   [name] 01/20/2023, 8:30 AM

## 2023-01-20 NOTE — Progress Notes (Signed)
 Assisted Dr. Mal Amabile with right, axillary, ultrasound guided block. Side rails up, monitors on throughout procedure. See vital signs in flow sheet. Tolerated Procedure well.

## 2023-01-21 ENCOUNTER — Encounter (HOSPITAL_BASED_OUTPATIENT_CLINIC_OR_DEPARTMENT_OTHER): Payer: Self-pay | Admitting: Orthopedic Surgery

## 2023-01-25 ENCOUNTER — Ambulatory Visit: Payer: Medicare PPO

## 2023-01-27 ENCOUNTER — Ambulatory Visit: Payer: Medicare PPO

## 2023-02-03 ENCOUNTER — Encounter: Payer: Self-pay | Admitting: Oncology

## 2023-02-04 ENCOUNTER — Inpatient Hospital Stay: Payer: HMO

## 2023-02-09 ENCOUNTER — Inpatient Hospital Stay: Payer: HMO | Attending: Oncology

## 2023-02-09 VITALS — BP 159/56 | HR 74 | Temp 98.0°F | Resp 16 | Ht 65.0 in | Wt 187.1 lb

## 2023-02-09 DIAGNOSIS — D509 Iron deficiency anemia, unspecified: Secondary | ICD-10-CM | POA: Diagnosis not present

## 2023-02-09 MED ORDER — SODIUM CHLORIDE 0.9 % IV SOLN: INTRAVENOUS | Status: DC

## 2023-02-09 MED ORDER — IRON SUCROSE 20 MG/ML IV SOLN
200.0000 mg | Freq: Once | INTRAVENOUS | Status: AC
  Administered 2023-02-09: 200 mg via INTRAVENOUS
  Filled 2023-02-09: qty 10

## 2023-02-09 NOTE — Patient Instructions (Signed)

## 2023-02-16 DIAGNOSIS — Z6832 Body mass index (BMI) 32.0-32.9, adult: Secondary | ICD-10-CM | POA: Diagnosis not present

## 2023-02-16 DIAGNOSIS — F419 Anxiety disorder, unspecified: Secondary | ICD-10-CM | POA: Diagnosis not present

## 2023-02-16 DIAGNOSIS — R413 Other amnesia: Secondary | ICD-10-CM | POA: Diagnosis not present

## 2023-02-16 DIAGNOSIS — K219 Gastro-esophageal reflux disease without esophagitis: Secondary | ICD-10-CM | POA: Diagnosis not present

## 2023-02-16 DIAGNOSIS — M7989 Other specified soft tissue disorders: Secondary | ICD-10-CM | POA: Diagnosis not present

## 2023-02-16 DIAGNOSIS — M79669 Pain in unspecified lower leg: Secondary | ICD-10-CM | POA: Diagnosis not present

## 2023-02-16 DIAGNOSIS — I10 Essential (primary) hypertension: Secondary | ICD-10-CM | POA: Diagnosis not present

## 2023-02-16 DIAGNOSIS — J309 Allergic rhinitis, unspecified: Secondary | ICD-10-CM | POA: Diagnosis not present

## 2023-02-17 DIAGNOSIS — H353231 Exudative age-related macular degeneration, bilateral, with active choroidal neovascularization: Secondary | ICD-10-CM | POA: Diagnosis not present

## 2023-03-03 DIAGNOSIS — Z6830 Body mass index (BMI) 30.0-30.9, adult: Secondary | ICD-10-CM | POA: Diagnosis not present

## 2023-03-03 DIAGNOSIS — E559 Vitamin D deficiency, unspecified: Secondary | ICD-10-CM | POA: Diagnosis not present

## 2023-03-03 DIAGNOSIS — E538 Deficiency of other specified B group vitamins: Secondary | ICD-10-CM | POA: Diagnosis not present

## 2023-03-03 DIAGNOSIS — Z79899 Other long term (current) drug therapy: Secondary | ICD-10-CM | POA: Diagnosis not present

## 2023-03-03 DIAGNOSIS — E1169 Type 2 diabetes mellitus with other specified complication: Secondary | ICD-10-CM | POA: Diagnosis not present

## 2023-03-03 DIAGNOSIS — M47812 Spondylosis without myelopathy or radiculopathy, cervical region: Secondary | ICD-10-CM | POA: Diagnosis not present

## 2023-03-03 DIAGNOSIS — F439 Reaction to severe stress, unspecified: Secondary | ICD-10-CM | POA: Diagnosis not present

## 2023-03-06 DIAGNOSIS — Z741 Need for assistance with personal care: Secondary | ICD-10-CM | POA: Diagnosis not present

## 2023-03-06 DIAGNOSIS — G4733 Obstructive sleep apnea (adult) (pediatric): Secondary | ICD-10-CM | POA: Diagnosis not present

## 2023-03-06 DIAGNOSIS — Z96653 Presence of artificial knee joint, bilateral: Secondary | ICD-10-CM | POA: Diagnosis not present

## 2023-03-06 DIAGNOSIS — H3554 Dystrophies primarily involving the retinal pigment epithelium: Secondary | ICD-10-CM | POA: Diagnosis not present

## 2023-03-06 DIAGNOSIS — K253 Acute gastric ulcer without hemorrhage or perforation: Secondary | ICD-10-CM | POA: Diagnosis not present

## 2023-03-06 DIAGNOSIS — R109 Unspecified abdominal pain: Secondary | ICD-10-CM | POA: Diagnosis not present

## 2023-03-06 DIAGNOSIS — E869 Volume depletion, unspecified: Secondary | ICD-10-CM | POA: Diagnosis not present

## 2023-03-06 DIAGNOSIS — E1140 Type 2 diabetes mellitus with diabetic neuropathy, unspecified: Secondary | ICD-10-CM | POA: Diagnosis not present

## 2023-03-06 DIAGNOSIS — K920 Hematemesis: Secondary | ICD-10-CM | POA: Diagnosis not present

## 2023-03-06 DIAGNOSIS — K254 Chronic or unspecified gastric ulcer with hemorrhage: Secondary | ICD-10-CM | POA: Diagnosis not present

## 2023-03-06 DIAGNOSIS — E870 Hyperosmolality and hypernatremia: Secondary | ICD-10-CM | POA: Diagnosis not present

## 2023-03-06 DIAGNOSIS — R197 Diarrhea, unspecified: Secondary | ICD-10-CM | POA: Diagnosis not present

## 2023-03-06 DIAGNOSIS — E1122 Type 2 diabetes mellitus with diabetic chronic kidney disease: Secondary | ICD-10-CM | POA: Diagnosis not present

## 2023-03-06 DIAGNOSIS — R519 Headache, unspecified: Secondary | ICD-10-CM | POA: Diagnosis not present

## 2023-03-06 DIAGNOSIS — E782 Mixed hyperlipidemia: Secondary | ICD-10-CM | POA: Diagnosis not present

## 2023-03-06 DIAGNOSIS — N189 Chronic kidney disease, unspecified: Secondary | ICD-10-CM | POA: Diagnosis not present

## 2023-03-06 DIAGNOSIS — R58 Hemorrhage, not elsewhere classified: Secondary | ICD-10-CM | POA: Diagnosis not present

## 2023-03-06 DIAGNOSIS — K922 Gastrointestinal hemorrhage, unspecified: Secondary | ICD-10-CM | POA: Diagnosis not present

## 2023-03-06 DIAGNOSIS — Z743 Need for continuous supervision: Secondary | ICD-10-CM | POA: Diagnosis not present

## 2023-03-06 DIAGNOSIS — R0902 Hypoxemia: Secondary | ICD-10-CM | POA: Diagnosis not present

## 2023-03-06 DIAGNOSIS — N179 Acute kidney failure, unspecified: Secondary | ICD-10-CM | POA: Diagnosis not present

## 2023-03-06 DIAGNOSIS — I10 Essential (primary) hypertension: Secondary | ICD-10-CM | POA: Diagnosis not present

## 2023-03-06 DIAGNOSIS — D500 Iron deficiency anemia secondary to blood loss (chronic): Secondary | ICD-10-CM | POA: Diagnosis not present

## 2023-03-06 DIAGNOSIS — Z79899 Other long term (current) drug therapy: Secondary | ICD-10-CM | POA: Diagnosis not present

## 2023-03-06 DIAGNOSIS — R112 Nausea with vomiting, unspecified: Secondary | ICD-10-CM | POA: Diagnosis not present

## 2023-03-06 DIAGNOSIS — K5730 Diverticulosis of large intestine without perforation or abscess without bleeding: Secondary | ICD-10-CM | POA: Diagnosis not present

## 2023-03-06 DIAGNOSIS — R42 Dizziness and giddiness: Secondary | ICD-10-CM | POA: Diagnosis not present

## 2023-03-06 DIAGNOSIS — R2689 Other abnormalities of gait and mobility: Secondary | ICD-10-CM | POA: Diagnosis not present

## 2023-03-06 DIAGNOSIS — I129 Hypertensive chronic kidney disease with stage 1 through stage 4 chronic kidney disease, or unspecified chronic kidney disease: Secondary | ICD-10-CM | POA: Diagnosis not present

## 2023-03-06 DIAGNOSIS — K921 Melena: Secondary | ICD-10-CM | POA: Diagnosis not present

## 2023-03-06 DIAGNOSIS — K259 Gastric ulcer, unspecified as acute or chronic, without hemorrhage or perforation: Secondary | ICD-10-CM | POA: Diagnosis not present

## 2023-03-06 DIAGNOSIS — Z86718 Personal history of other venous thrombosis and embolism: Secondary | ICD-10-CM | POA: Diagnosis not present

## 2023-03-06 DIAGNOSIS — Z7901 Long term (current) use of anticoagulants: Secondary | ICD-10-CM | POA: Diagnosis not present

## 2023-03-06 DIAGNOSIS — R531 Weakness: Secondary | ICD-10-CM | POA: Diagnosis not present

## 2023-03-06 DIAGNOSIS — Z86711 Personal history of pulmonary embolism: Secondary | ICD-10-CM | POA: Diagnosis not present

## 2023-03-06 DIAGNOSIS — Z7984 Long term (current) use of oral hypoglycemic drugs: Secondary | ICD-10-CM | POA: Diagnosis not present

## 2023-03-06 DIAGNOSIS — F330 Major depressive disorder, recurrent, mild: Secondary | ICD-10-CM | POA: Diagnosis not present

## 2023-03-06 DIAGNOSIS — K219 Gastro-esophageal reflux disease without esophagitis: Secondary | ICD-10-CM | POA: Diagnosis not present

## 2023-03-06 DIAGNOSIS — M6281 Muscle weakness (generalized): Secondary | ICD-10-CM | POA: Diagnosis not present

## 2023-03-06 DIAGNOSIS — R1110 Vomiting, unspecified: Secondary | ICD-10-CM | POA: Diagnosis not present

## 2023-03-06 DIAGNOSIS — D6832 Hemorrhagic disorder due to extrinsic circulating anticoagulants: Secondary | ICD-10-CM | POA: Diagnosis not present

## 2023-03-06 DIAGNOSIS — E785 Hyperlipidemia, unspecified: Secondary | ICD-10-CM | POA: Diagnosis not present

## 2023-03-06 DIAGNOSIS — I4891 Unspecified atrial fibrillation: Secondary | ICD-10-CM | POA: Diagnosis not present

## 2023-03-06 DIAGNOSIS — D62 Acute posthemorrhagic anemia: Secondary | ICD-10-CM | POA: Diagnosis not present

## 2023-03-07 DIAGNOSIS — K259 Gastric ulcer, unspecified as acute or chronic, without hemorrhage or perforation: Secondary | ICD-10-CM | POA: Diagnosis not present

## 2023-03-07 DIAGNOSIS — K921 Melena: Secondary | ICD-10-CM | POA: Diagnosis not present

## 2023-03-08 DIAGNOSIS — M6281 Muscle weakness (generalized): Secondary | ICD-10-CM | POA: Insufficient documentation

## 2023-03-15 DIAGNOSIS — Z743 Need for continuous supervision: Secondary | ICD-10-CM | POA: Diagnosis not present

## 2023-03-15 DIAGNOSIS — E785 Hyperlipidemia, unspecified: Secondary | ICD-10-CM | POA: Diagnosis not present

## 2023-03-15 DIAGNOSIS — H3530 Unspecified macular degeneration: Secondary | ICD-10-CM | POA: Diagnosis not present

## 2023-03-15 DIAGNOSIS — H3554 Dystrophies primarily involving the retinal pigment epithelium: Secondary | ICD-10-CM | POA: Diagnosis not present

## 2023-03-15 DIAGNOSIS — K219 Gastro-esophageal reflux disease without esophagitis: Secondary | ICD-10-CM | POA: Diagnosis not present

## 2023-03-15 DIAGNOSIS — D500 Iron deficiency anemia secondary to blood loss (chronic): Secondary | ICD-10-CM | POA: Diagnosis not present

## 2023-03-15 DIAGNOSIS — R58 Hemorrhage, not elsewhere classified: Secondary | ICD-10-CM | POA: Diagnosis not present

## 2023-03-15 DIAGNOSIS — Z96653 Presence of artificial knee joint, bilateral: Secondary | ICD-10-CM | POA: Diagnosis not present

## 2023-03-15 DIAGNOSIS — F330 Major depressive disorder, recurrent, mild: Secondary | ICD-10-CM | POA: Diagnosis not present

## 2023-03-15 DIAGNOSIS — I10 Essential (primary) hypertension: Secondary | ICD-10-CM | POA: Diagnosis not present

## 2023-03-15 DIAGNOSIS — E1140 Type 2 diabetes mellitus with diabetic neuropathy, unspecified: Secondary | ICD-10-CM | POA: Diagnosis not present

## 2023-03-15 DIAGNOSIS — R531 Weakness: Secondary | ICD-10-CM | POA: Diagnosis not present

## 2023-03-15 DIAGNOSIS — M6281 Muscle weakness (generalized): Secondary | ICD-10-CM | POA: Diagnosis not present

## 2023-03-15 DIAGNOSIS — Z86718 Personal history of other venous thrombosis and embolism: Secondary | ICD-10-CM | POA: Diagnosis not present

## 2023-03-15 DIAGNOSIS — E782 Mixed hyperlipidemia: Secondary | ICD-10-CM | POA: Diagnosis not present

## 2023-03-15 DIAGNOSIS — M1990 Unspecified osteoarthritis, unspecified site: Secondary | ICD-10-CM | POA: Diagnosis not present

## 2023-03-15 DIAGNOSIS — Z741 Need for assistance with personal care: Secondary | ICD-10-CM | POA: Diagnosis not present

## 2023-03-15 DIAGNOSIS — D6832 Hemorrhagic disorder due to extrinsic circulating anticoagulants: Secondary | ICD-10-CM | POA: Diagnosis not present

## 2023-03-15 DIAGNOSIS — R2689 Other abnormalities of gait and mobility: Secondary | ICD-10-CM | POA: Diagnosis not present

## 2023-03-15 DIAGNOSIS — K922 Gastrointestinal hemorrhage, unspecified: Secondary | ICD-10-CM | POA: Diagnosis not present

## 2023-03-15 DIAGNOSIS — N189 Chronic kidney disease, unspecified: Secondary | ICD-10-CM | POA: Diagnosis not present

## 2023-03-15 DIAGNOSIS — Z86711 Personal history of pulmonary embolism: Secondary | ICD-10-CM | POA: Diagnosis not present

## 2023-03-15 DIAGNOSIS — D509 Iron deficiency anemia, unspecified: Secondary | ICD-10-CM | POA: Diagnosis not present

## 2023-03-17 DIAGNOSIS — H3530 Unspecified macular degeneration: Secondary | ICD-10-CM | POA: Diagnosis not present

## 2023-03-17 DIAGNOSIS — Z86711 Personal history of pulmonary embolism: Secondary | ICD-10-CM | POA: Diagnosis not present

## 2023-03-17 DIAGNOSIS — Z86718 Personal history of other venous thrombosis and embolism: Secondary | ICD-10-CM | POA: Diagnosis not present

## 2023-03-17 DIAGNOSIS — N189 Chronic kidney disease, unspecified: Secondary | ICD-10-CM | POA: Diagnosis not present

## 2023-03-17 DIAGNOSIS — E785 Hyperlipidemia, unspecified: Secondary | ICD-10-CM | POA: Diagnosis not present

## 2023-03-17 DIAGNOSIS — I10 Essential (primary) hypertension: Secondary | ICD-10-CM | POA: Diagnosis not present

## 2023-03-17 DIAGNOSIS — K219 Gastro-esophageal reflux disease without esophagitis: Secondary | ICD-10-CM | POA: Diagnosis not present

## 2023-03-17 DIAGNOSIS — M1990 Unspecified osteoarthritis, unspecified site: Secondary | ICD-10-CM | POA: Diagnosis not present

## 2023-03-17 DIAGNOSIS — K922 Gastrointestinal hemorrhage, unspecified: Secondary | ICD-10-CM | POA: Diagnosis not present

## 2023-03-17 DIAGNOSIS — E1140 Type 2 diabetes mellitus with diabetic neuropathy, unspecified: Secondary | ICD-10-CM | POA: Diagnosis not present

## 2023-03-17 DIAGNOSIS — M6281 Muscle weakness (generalized): Secondary | ICD-10-CM | POA: Diagnosis not present

## 2023-03-17 DIAGNOSIS — Z96653 Presence of artificial knee joint, bilateral: Secondary | ICD-10-CM | POA: Diagnosis not present

## 2023-03-30 NOTE — Progress Notes (Deleted)
 [facility]  [address] [place],  [place]  [address] [phone]  Clinic Day:  03/30/2023  Referring physician: Lucianne Lei, MD   HISTORY OF PRESENT ILLNESS:  The patient is a 78 y.o. female with iron deficiency anemia.  She comes in today to reassess her iron and hemoglobin levels after receiving IV iron in January/February 2025.    Of note, a GI workup done in September 2023 showed no adverse GI tract pathology.  PHYSICAL EXAM:  There were no vitals taken for this visit. Wt Readings from Last 3 Encounters:  02/09/23 187 lb 1.3 oz (84.9 kg)  01/20/23 187 lb 6.3 oz (85 kg)  01/06/23 190 lb 1.3 oz (86.2 kg)   There is no height or weight on file to calculate BMI. Performance status (ECOG): 1 - Symptomatic but completely ambulatory Physical Exam Constitutional:      Appearance: Normal appearance. She is not ill-appearing.  HENT:     Mouth/Throat:     Mouth: Mucous membranes are moist.     Pharynx: Oropharynx is clear. No oropharyngeal exudate or posterior oropharyngeal erythema.  Cardiovascular:     Rate and Rhythm: Normal rate and regular rhythm.     Heart sounds: No murmur heard.    No friction rub. No gallop.  Pulmonary:     Effort: Pulmonary effort is normal. No respiratory distress.     Breath sounds: Normal breath sounds. No wheezing, rhonchi or rales.  Abdominal:     General: Bowel sounds are normal. There is no distension.     Palpations: Abdomen is soft. There is no mass.     Tenderness: There is no abdominal tenderness.  Musculoskeletal:        General: No swelling.     Right lower leg: No edema.     Left lower leg: No edema.  Lymphadenopathy:     Cervical: No cervical adenopathy.     Upper Body:     Right upper body: No supraclavicular or axillary adenopathy.     Left upper body: No supraclavicular or axillary adenopathy.     Lower Body: No right inguinal adenopathy. No left inguinal adenopathy.  Skin:    General:  Skin is warm.     Coloration: Skin is not jaundiced.     Findings: No lesion or rash.  Neurological:     General: No focal deficit present.     Mental Status: She is alert and oriented to person, place, and time. Mental status is at baseline.  Psychiatric:        Mood and Affect: Mood normal.        Behavior: Behavior normal.        Thought Content: Thought content normal.    LABS:      Latest Ref Rng & Units 12/24/2022   12:22 PM 09/12/2010    3:55 AM 09/11/2010    3:50 AM  CBC  WBC 4.0 - 10.5 K/uL 7.4  7.9  9.8   Hemoglobin 12.0 - 15.0 g/dL 9.5  9.8  40.1   Hematocrit 36.0 - 46.0 % 28.2  30.7  34.7   Platelets 150 - 400 K/uL 373  248  237       Latest Ref Rng & Units 12/24/2022   12:22 PM 12/23/2020   11:30 AM 09/12/2010    3:55 AM  CMP  Glucose 70 - 99 mg/dL 83  78  027   BUN 8 - 23 mg/dL 31  20  10   Creatinine 0.44 - 1.00 mg/dL 1.61  0.96  0.45   Sodium 135 - 145 mmol/L 137  137  140   Potassium 3.5 - 5.1 mmol/L 4.5  4.5  3.6   Chloride 98 - 111 mmol/L 100  105  104   CO2 22 - 32 mmol/L 26  26  32   Calcium 8.9 - 10.3 mg/dL 9.2  8.7  8.5   Total Protein 6.5 - 8.1 g/dL 6.1     Total Bilirubin <1.2 mg/dL 0.3     Alkaline Phos 38 - 126 U/L 38     AST 15 - 41 U/L 38     ALT 0 - 44 U/L 15       Latest Reference Range & Units 12/24/22 12:22  Iron 28 - 170 ug/dL 52  UIBC ug/dL 409  TIBC 811 - 914 ug/dL 782 (H)  Saturation Ratios 10.4 - 31.8 % 10 (L)  Ferritin 11 - 307 ng/mL 15  Folate >5.9 ng/mL 16.7  Vitamin B12 180 - 914 pg/mL 680  (H): Data is abnormally high (L): Data is abnormally low  ASSESSMENT & PLAN:  Assessment/Plan:  A 79 y.o. female who I recently began seeing for anemia.  When evaluating all of her recent labs, it appears she is iron deficient.  Based upon this, I will arrange for her to receive IV iron over these next few weeks to rapidly replenish her iron stores and improve her hemoglobin.  She has a mild component of renal insufficiency which may be  factoring into her anemia.  This will continue to be followed over time.  Otherwise, I will see her back in 3 months to see how well she responded to her IV iron.  The patient understands all the plans discussed today and is in agreement with them.      Sharel Behne Kirby Funk, MD

## 2023-03-31 ENCOUNTER — Telehealth: Payer: Self-pay | Admitting: Oncology

## 2023-03-31 ENCOUNTER — Inpatient Hospital Stay: Payer: Medicare PPO | Admitting: Oncology

## 2023-03-31 ENCOUNTER — Inpatient Hospital Stay: Payer: Medicare PPO

## 2023-03-31 DIAGNOSIS — K219 Gastro-esophageal reflux disease without esophagitis: Secondary | ICD-10-CM | POA: Diagnosis not present

## 2023-03-31 DIAGNOSIS — D509 Iron deficiency anemia, unspecified: Secondary | ICD-10-CM | POA: Diagnosis not present

## 2023-03-31 DIAGNOSIS — E1140 Type 2 diabetes mellitus with diabetic neuropathy, unspecified: Secondary | ICD-10-CM | POA: Diagnosis not present

## 2023-03-31 DIAGNOSIS — M6281 Muscle weakness (generalized): Secondary | ICD-10-CM | POA: Diagnosis not present

## 2023-03-31 DIAGNOSIS — Z86718 Personal history of other venous thrombosis and embolism: Secondary | ICD-10-CM | POA: Diagnosis not present

## 2023-03-31 DIAGNOSIS — I10 Essential (primary) hypertension: Secondary | ICD-10-CM | POA: Diagnosis not present

## 2023-03-31 DIAGNOSIS — E785 Hyperlipidemia, unspecified: Secondary | ICD-10-CM | POA: Diagnosis not present

## 2023-03-31 DIAGNOSIS — H3530 Unspecified macular degeneration: Secondary | ICD-10-CM | POA: Diagnosis not present

## 2023-03-31 DIAGNOSIS — N189 Chronic kidney disease, unspecified: Secondary | ICD-10-CM | POA: Diagnosis not present

## 2023-03-31 DIAGNOSIS — M1990 Unspecified osteoarthritis, unspecified site: Secondary | ICD-10-CM | POA: Diagnosis not present

## 2023-03-31 DIAGNOSIS — Z96653 Presence of artificial knee joint, bilateral: Secondary | ICD-10-CM | POA: Diagnosis not present

## 2023-03-31 DIAGNOSIS — Z86711 Personal history of pulmonary embolism: Secondary | ICD-10-CM | POA: Diagnosis not present

## 2023-03-31 NOTE — Telephone Encounter (Signed)
 03/31/23 Daughter called and stated that patient is in a rehab facility.She will call back to reschedule when patient is released.

## 2023-04-04 ENCOUNTER — Telehealth: Payer: Self-pay | Admitting: Oncology

## 2023-04-04 DIAGNOSIS — Z9181 History of falling: Secondary | ICD-10-CM | POA: Diagnosis not present

## 2023-04-04 DIAGNOSIS — Z86711 Personal history of pulmonary embolism: Secondary | ICD-10-CM | POA: Diagnosis not present

## 2023-04-04 DIAGNOSIS — I129 Hypertensive chronic kidney disease with stage 1 through stage 4 chronic kidney disease, or unspecified chronic kidney disease: Secondary | ICD-10-CM | POA: Diagnosis not present

## 2023-04-04 DIAGNOSIS — D509 Iron deficiency anemia, unspecified: Secondary | ICD-10-CM | POA: Diagnosis not present

## 2023-04-04 DIAGNOSIS — Z96653 Presence of artificial knee joint, bilateral: Secondary | ICD-10-CM | POA: Diagnosis not present

## 2023-04-04 DIAGNOSIS — J45909 Unspecified asthma, uncomplicated: Secondary | ICD-10-CM | POA: Diagnosis not present

## 2023-04-04 DIAGNOSIS — K219 Gastro-esophageal reflux disease without esophagitis: Secondary | ICD-10-CM | POA: Diagnosis not present

## 2023-04-04 DIAGNOSIS — E1140 Type 2 diabetes mellitus with diabetic neuropathy, unspecified: Secondary | ICD-10-CM | POA: Diagnosis not present

## 2023-04-04 DIAGNOSIS — N189 Chronic kidney disease, unspecified: Secondary | ICD-10-CM | POA: Diagnosis not present

## 2023-04-04 DIAGNOSIS — Z7982 Long term (current) use of aspirin: Secondary | ICD-10-CM | POA: Diagnosis not present

## 2023-04-04 DIAGNOSIS — E785 Hyperlipidemia, unspecified: Secondary | ICD-10-CM | POA: Diagnosis not present

## 2023-04-04 DIAGNOSIS — M6281 Muscle weakness (generalized): Secondary | ICD-10-CM | POA: Diagnosis not present

## 2023-04-04 DIAGNOSIS — E1122 Type 2 diabetes mellitus with diabetic chronic kidney disease: Secondary | ICD-10-CM | POA: Diagnosis not present

## 2023-04-04 DIAGNOSIS — Z7901 Long term (current) use of anticoagulants: Secondary | ICD-10-CM | POA: Diagnosis not present

## 2023-04-04 DIAGNOSIS — Z79899 Other long term (current) drug therapy: Secondary | ICD-10-CM | POA: Diagnosis not present

## 2023-04-04 DIAGNOSIS — Z7984 Long term (current) use of oral hypoglycemic drugs: Secondary | ICD-10-CM | POA: Diagnosis not present

## 2023-04-04 DIAGNOSIS — Z556 Problems related to health literacy: Secondary | ICD-10-CM | POA: Diagnosis not present

## 2023-04-04 DIAGNOSIS — T45515D Adverse effect of anticoagulants, subsequent encounter: Secondary | ICD-10-CM | POA: Diagnosis not present

## 2023-04-04 DIAGNOSIS — Z86718 Personal history of other venous thrombosis and embolism: Secondary | ICD-10-CM | POA: Diagnosis not present

## 2023-04-04 DIAGNOSIS — M1990 Unspecified osteoarthritis, unspecified site: Secondary | ICD-10-CM | POA: Diagnosis not present

## 2023-04-04 DIAGNOSIS — H3530 Unspecified macular degeneration: Secondary | ICD-10-CM | POA: Diagnosis not present

## 2023-04-04 DIAGNOSIS — D6832 Hemorrhagic disorder due to extrinsic circulating anticoagulants: Secondary | ICD-10-CM | POA: Diagnosis not present

## 2023-04-04 NOTE — Telephone Encounter (Signed)
 04/04/23 Spoke with patient and rescheduled appts.

## 2023-04-08 ENCOUNTER — Encounter: Payer: Self-pay | Admitting: Gastroenterology

## 2023-04-12 DIAGNOSIS — I10 Essential (primary) hypertension: Secondary | ICD-10-CM | POA: Diagnosis not present

## 2023-04-12 DIAGNOSIS — E663 Overweight: Secondary | ICD-10-CM | POA: Diagnosis not present

## 2023-04-12 DIAGNOSIS — K922 Gastrointestinal hemorrhage, unspecified: Secondary | ICD-10-CM | POA: Diagnosis not present

## 2023-04-12 DIAGNOSIS — Z6829 Body mass index (BMI) 29.0-29.9, adult: Secondary | ICD-10-CM | POA: Diagnosis not present

## 2023-04-12 DIAGNOSIS — D649 Anemia, unspecified: Secondary | ICD-10-CM | POA: Diagnosis not present

## 2023-04-12 DIAGNOSIS — Z79899 Other long term (current) drug therapy: Secondary | ICD-10-CM | POA: Diagnosis not present

## 2023-04-13 DIAGNOSIS — R1013 Epigastric pain: Secondary | ICD-10-CM | POA: Diagnosis not present

## 2023-04-13 DIAGNOSIS — K250 Acute gastric ulcer with hemorrhage: Secondary | ICD-10-CM | POA: Diagnosis not present

## 2023-04-13 DIAGNOSIS — N179 Acute kidney failure, unspecified: Secondary | ICD-10-CM | POA: Diagnosis not present

## 2023-04-14 ENCOUNTER — Inpatient Hospital Stay: Attending: Oncology | Admitting: Oncology

## 2023-04-14 ENCOUNTER — Telehealth: Payer: Self-pay | Admitting: Oncology

## 2023-04-14 ENCOUNTER — Other Ambulatory Visit: Payer: Self-pay | Admitting: Oncology

## 2023-04-14 ENCOUNTER — Inpatient Hospital Stay

## 2023-04-14 DIAGNOSIS — D631 Anemia in chronic kidney disease: Secondary | ICD-10-CM | POA: Diagnosis not present

## 2023-04-14 DIAGNOSIS — D509 Iron deficiency anemia, unspecified: Secondary | ICD-10-CM | POA: Insufficient documentation

## 2023-04-14 DIAGNOSIS — D508 Other iron deficiency anemias: Secondary | ICD-10-CM

## 2023-04-14 DIAGNOSIS — D6832 Hemorrhagic disorder due to extrinsic circulating anticoagulants: Secondary | ICD-10-CM | POA: Diagnosis not present

## 2023-04-14 DIAGNOSIS — R2689 Other abnormalities of gait and mobility: Secondary | ICD-10-CM | POA: Diagnosis not present

## 2023-04-14 DIAGNOSIS — N189 Chronic kidney disease, unspecified: Secondary | ICD-10-CM | POA: Insufficient documentation

## 2023-04-14 DIAGNOSIS — D649 Anemia, unspecified: Secondary | ICD-10-CM

## 2023-04-14 DIAGNOSIS — Z79899 Other long term (current) drug therapy: Secondary | ICD-10-CM | POA: Diagnosis not present

## 2023-04-14 DIAGNOSIS — I10 Essential (primary) hypertension: Secondary | ICD-10-CM | POA: Diagnosis not present

## 2023-04-14 DIAGNOSIS — H3554 Dystrophies primarily involving the retinal pigment epithelium: Secondary | ICD-10-CM | POA: Diagnosis not present

## 2023-04-14 LAB — CBC WITH DIFFERENTIAL (CANCER CENTER ONLY)
Abs Immature Granulocytes: 0.03 10*3/uL (ref 0.00–0.07)
Basophils Absolute: 0 10*3/uL (ref 0.0–0.1)
Basophils Relative: 1 %
Eosinophils Absolute: 0.2 10*3/uL (ref 0.0–0.5)
Eosinophils Relative: 3 %
HCT: 31.6 % — ABNORMAL LOW (ref 36.0–46.0)
Hemoglobin: 10 g/dL — ABNORMAL LOW (ref 12.0–15.0)
Immature Granulocytes: 1 %
Lymphocytes Relative: 18 %
Lymphs Abs: 1 10*3/uL (ref 0.7–4.0)
MCH: 27.5 pg (ref 26.0–34.0)
MCHC: 31.6 g/dL (ref 30.0–36.0)
MCV: 87.1 fL (ref 80.0–100.0)
Monocytes Absolute: 0.4 10*3/uL (ref 0.1–1.0)
Monocytes Relative: 8 %
Neutro Abs: 3.7 10*3/uL (ref 1.7–7.7)
Neutrophils Relative %: 69 %
Platelet Count: 283 10*3/uL (ref 150–400)
RBC: 3.63 MIL/uL — ABNORMAL LOW (ref 3.87–5.11)
RDW: 15.6 % — ABNORMAL HIGH (ref 11.5–15.5)
WBC Count: 5.2 10*3/uL (ref 4.0–10.5)
nRBC: 0 % (ref 0.0–0.2)
nRBC: 0 /100{WBCs}

## 2023-04-14 LAB — CMP (CANCER CENTER ONLY)
ALT: 15 U/L (ref 0–44)
AST: 34 U/L (ref 15–41)
Albumin: 3.7 g/dL (ref 3.5–5.0)
Alkaline Phosphatase: 43 U/L (ref 38–126)
Anion gap: 11 (ref 5–15)
BUN: 28 mg/dL — ABNORMAL HIGH (ref 8–23)
CO2: 28 mmol/L (ref 22–32)
Calcium: 8.6 mg/dL — ABNORMAL LOW (ref 8.9–10.3)
Chloride: 92 mmol/L — ABNORMAL LOW (ref 98–111)
Creatinine: 1.61 mg/dL — ABNORMAL HIGH (ref 0.44–1.00)
GFR, Estimated: 32 mL/min — ABNORMAL LOW (ref >60)
Glucose, Bld: 129 mg/dL — ABNORMAL HIGH (ref 70–99)
Potassium: 3.9 mmol/L (ref 3.5–5.1)
Sodium: 130 mmol/L — ABNORMAL LOW (ref 135–145)
Total Bilirubin: 0.4 mg/dL (ref 0.0–1.2)
Total Protein: 6 g/dL — ABNORMAL LOW (ref 6.5–8.1)

## 2023-04-14 LAB — IRON AND TIBC
Iron: 69 ug/dL (ref 28–170)
Saturation Ratios: 17 % (ref 10.4–31.8)
TIBC: 396 ug/dL (ref 250–450)
UIBC: 327 ug/dL

## 2023-04-14 LAB — FOLATE: Folate: 20 ng/mL (ref >5.9)

## 2023-04-14 LAB — TSH: TSH: 0.746 u[IU]/mL (ref 0.350–4.500)

## 2023-04-14 LAB — FERRITIN: Ferritin: 109 ng/mL (ref 11–307)

## 2023-04-14 LAB — VITAMIN B12: Vitamin B-12: 339 pg/mL (ref 180–914)

## 2023-04-14 NOTE — Progress Notes (Signed)
 [facility]  [address] [place],  [place]  [address] [phone]  Clinic Day:  04/14/2023  Referring physician: Tamela Fake, MD   HISTORY OF PRESENT ILLNESS:  The patient is a 78 y.o. female with anemia that appears to be multifactorial, including being due to iron deficiency and chronic renal insufficiency.  She comes in today to reassess her labs after receiving a course of IV iron in January/February 2025.  Overall, the patient has been doing okay.  Of note, the patient was recently hospitalized for anemia and melena.  An EGD in March 2025 showed multiple antral ulcers.  At that time, her initial hemoglobin was 7.7.    PHYSICAL EXAM:  Blood pressure (!) 141/69, pulse 88, temperature 98.3 F (36.8 C), temperature source Oral, resp. rate 16, height 5\' 5"  (1.651 m), weight 181 lb 3.2 oz (82.2 kg), SpO2 98%. Wt Readings from Last 3 Encounters:  04/14/23 181 lb 3.2 oz (82.2 kg)  02/09/23 187 lb 1.3 oz (84.9 kg)  01/20/23 187 lb 6.3 oz (85 kg)   Body mass index is 30.15 kg/m. Performance status (ECOG): 1 - Symptomatic but completely ambulatory Physical Exam Constitutional:      Appearance: Normal appearance. She is not ill-appearing.  HENT:     Mouth/Throat:     Mouth: Mucous membranes are moist.     Pharynx: Oropharynx is clear. No oropharyngeal exudate or posterior oropharyngeal erythema.  Cardiovascular:     Rate and Rhythm: Normal rate and regular rhythm.     Heart sounds: No murmur heard.    No friction rub. No gallop.  Pulmonary:     Effort: Pulmonary effort is normal. No respiratory distress.     Breath sounds: Normal breath sounds. No wheezing, rhonchi or rales.  Abdominal:     General: Bowel sounds are normal. There is no distension.     Palpations: Abdomen is soft. There is no mass.     Tenderness: There is no abdominal tenderness.  Musculoskeletal:        General: No swelling.     Right lower leg: No edema.     Left lower leg:  No edema.  Lymphadenopathy:     Cervical: No cervical adenopathy.     Upper Body:     Right upper body: No supraclavicular or axillary adenopathy.     Left upper body: No supraclavicular or axillary adenopathy.     Lower Body: No right inguinal adenopathy. No left inguinal adenopathy.  Skin:    General: Skin is warm.     Coloration: Skin is not jaundiced.     Findings: No lesion or rash.  Neurological:     General: No focal deficit present.     Mental Status: She is alert and oriented to person, place, and time. Mental status is at baseline.  Psychiatric:        Mood and Affect: Mood normal.        Behavior: Behavior normal.        Thought Content: Thought content normal.   LABS:      Latest Ref Rng & Units 04/14/2023    1:10 PM 12/24/2022   12:22 PM 09/12/2010    3:55 AM  CBC  WBC 4.0 - 10.5 K/uL 5.2  7.4  7.9   Hemoglobin 12.0 - 15.0 g/dL 91.4  9.5  9.8   Hematocrit 36.0 - 46.0 % 31.6  28.2  30.7   Platelets 150 - 400 K/uL 283  373  248       Latest Ref Rng & Units 04/14/2023    1:10 PM 12/24/2022   12:22 PM 12/23/2020   11:30 AM  CMP  Glucose 70 - 99 mg/dL 914  83  78   BUN 8 - 23 mg/dL 28  31  20    Creatinine 0.44 - 1.00 mg/dL 7.82  9.56  2.13   Sodium 135 - 145 mmol/L 130  137  137   Potassium 3.5 - 5.1 mmol/L 3.9  4.5  4.5   Chloride 98 - 111 mmol/L 92  100  105   CO2 22 - 32 mmol/L 28  26  26    Calcium 8.9 - 10.3 mg/dL 8.6  9.2  8.7   Total Protein 6.5 - 8.1 g/dL 6.0  6.1    Total Bilirubin 0.0 - 1.2 mg/dL 0.4  0.3    Alkaline Phos 38 - 126 U/L 43  38    AST 15 - 41 U/L 34  38    ALT 0 - 44 U/L 15  15      Latest Reference Range & Units 04/14/23 13:10  Iron 28 - 170 ug/dL 69  UIBC ug/dL 086  TIBC 578 - 469 ug/dL 629  Saturation Ratios 10.4 - 31.8 % 17  Ferritin 11 - 307 ng/mL 109  Folate >5.9 ng/mL 20.0  Vitamin B12 180 - 914 pg/mL 339    ASSESSMENT & PLAN:  Assessment/Plan:  A 78 y.o. female with anemia secondary to iron deficiency from apparent GI  blood loss, as well as kidney disease.  When evaluating her labs today, her hemoglobin is better than what is has been.  Her iron parameters are also better.  However, her hemoglobin has not returned to normal.  She understands her renal insufficiency is also a likely component factoring into her anemia.  For now, as her hemoglobin is 10, it will be followed conservatively.  I will see her back in 2 months for repeat clinical assessment.   The patient understands all the plans discussed today and is in agreement with them.      Kaycee Mcgaugh Felicia Horde, MD

## 2023-04-14 NOTE — Telephone Encounter (Signed)
 Patient has been scheduled for follow-up visit per 04/14/23 LOS.  Pt noted appt details on personal planner/calendar.

## 2023-04-15 ENCOUNTER — Telehealth: Payer: Self-pay

## 2023-04-15 ENCOUNTER — Other Ambulatory Visit: Payer: Self-pay

## 2023-04-15 NOTE — Telephone Encounter (Addendum)
 Latest Reference Range & Units 04/14/23 13:10  Iron 28 - 170 ug/dL 69  UIBC ug/dL 161  TIBC 096 - 045 ug/dL 409  Saturation Ratios 10.4 - 31.8 % 17  Ferritin 11 - 307 ng/mL 109  Folate >5.9 ng/mL 20.0  Vitamin B12 180 - 914 pg/mL 339  Dr Melvyn Neth reviewed labs, states they are good, no need for IV iron.

## 2023-04-18 ENCOUNTER — Encounter: Payer: Self-pay | Admitting: Oncology

## 2023-04-20 DIAGNOSIS — Z6830 Body mass index (BMI) 30.0-30.9, adult: Secondary | ICD-10-CM | POA: Diagnosis not present

## 2023-04-20 DIAGNOSIS — N1832 Chronic kidney disease, stage 3b: Secondary | ICD-10-CM | POA: Diagnosis not present

## 2023-04-20 DIAGNOSIS — E6609 Other obesity due to excess calories: Secondary | ICD-10-CM | POA: Diagnosis not present

## 2023-04-20 DIAGNOSIS — E66811 Obesity, class 1: Secondary | ICD-10-CM | POA: Diagnosis not present

## 2023-04-20 DIAGNOSIS — N179 Acute kidney failure, unspecified: Secondary | ICD-10-CM | POA: Diagnosis not present

## 2023-04-20 DIAGNOSIS — E539 Vitamin B deficiency, unspecified: Secondary | ICD-10-CM | POA: Diagnosis not present

## 2023-04-28 DIAGNOSIS — E1169 Type 2 diabetes mellitus with other specified complication: Secondary | ICD-10-CM | POA: Diagnosis not present

## 2023-04-28 DIAGNOSIS — D649 Anemia, unspecified: Secondary | ICD-10-CM | POA: Diagnosis not present

## 2023-04-28 DIAGNOSIS — I10 Essential (primary) hypertension: Secondary | ICD-10-CM | POA: Diagnosis not present

## 2023-04-28 DIAGNOSIS — Z79899 Other long term (current) drug therapy: Secondary | ICD-10-CM | POA: Diagnosis not present

## 2023-04-28 DIAGNOSIS — M47812 Spondylosis without myelopathy or radiculopathy, cervical region: Secondary | ICD-10-CM | POA: Diagnosis not present

## 2023-04-28 DIAGNOSIS — E559 Vitamin D deficiency, unspecified: Secondary | ICD-10-CM | POA: Diagnosis not present

## 2023-04-28 DIAGNOSIS — F419 Anxiety disorder, unspecified: Secondary | ICD-10-CM | POA: Diagnosis not present

## 2023-04-28 DIAGNOSIS — Z6830 Body mass index (BMI) 30.0-30.9, adult: Secondary | ICD-10-CM | POA: Diagnosis not present

## 2023-04-28 DIAGNOSIS — E785 Hyperlipidemia, unspecified: Secondary | ICD-10-CM | POA: Diagnosis not present

## 2023-04-28 DIAGNOSIS — E538 Deficiency of other specified B group vitamins: Secondary | ICD-10-CM | POA: Diagnosis not present

## 2023-04-28 DIAGNOSIS — M7989 Other specified soft tissue disorders: Secondary | ICD-10-CM | POA: Diagnosis not present

## 2023-04-29 ENCOUNTER — Telehealth: Payer: Self-pay

## 2023-04-29 NOTE — Telephone Encounter (Signed)
 Patient called and voiced she took herself off eliquis and started taking aspirin 81 mg twice daily for the last week. She was asking if this was ok. I see where she is seen for anemia here. I instructed her to call her PCP Dr Brooksie Cap as she voiced she refills her meds and follow this .She was also asking about and iron  supplement if this was needed and ok with her ulcers I did not see this in his notes. I did make her aware of the ulcers reported on last EGD and to talk with PCP concerning aspirin.

## 2023-05-19 DIAGNOSIS — H353231 Exudative age-related macular degeneration, bilateral, with active choroidal neovascularization: Secondary | ICD-10-CM | POA: Diagnosis not present

## 2023-05-26 ENCOUNTER — Telehealth: Payer: Self-pay | Admitting: Gastroenterology

## 2023-05-26 DIAGNOSIS — F439 Reaction to severe stress, unspecified: Secondary | ICD-10-CM | POA: Diagnosis not present

## 2023-05-26 DIAGNOSIS — Z6830 Body mass index (BMI) 30.0-30.9, adult: Secondary | ICD-10-CM | POA: Diagnosis not present

## 2023-05-26 DIAGNOSIS — E559 Vitamin D deficiency, unspecified: Secondary | ICD-10-CM | POA: Diagnosis not present

## 2023-05-26 DIAGNOSIS — E6609 Other obesity due to excess calories: Secondary | ICD-10-CM | POA: Diagnosis not present

## 2023-05-26 DIAGNOSIS — E1169 Type 2 diabetes mellitus with other specified complication: Secondary | ICD-10-CM | POA: Diagnosis not present

## 2023-05-26 DIAGNOSIS — E538 Deficiency of other specified B group vitamins: Secondary | ICD-10-CM | POA: Diagnosis not present

## 2023-05-26 DIAGNOSIS — M47812 Spondylosis without myelopathy or radiculopathy, cervical region: Secondary | ICD-10-CM | POA: Diagnosis not present

## 2023-05-26 DIAGNOSIS — E66811 Obesity, class 1: Secondary | ICD-10-CM | POA: Diagnosis not present

## 2023-05-26 NOTE — Telephone Encounter (Signed)
 Good Morning Dr Venice Gillis   Patient preferred provider   Patient was previously scheduled with our practice but due to having to have an emergency visit with [facility] in April of 2025,for having a acute gastric ulcer, now a transfer has to be submitted for review.   Please review and advise on scheduling.   Thank you

## 2023-06-01 NOTE — Telephone Encounter (Signed)
 OK to schedule APP clinic please RG

## 2023-06-02 NOTE — Telephone Encounter (Signed)
 Called patient leaving a detailed message to call back and schedule.

## 2023-06-03 ENCOUNTER — Encounter: Payer: Self-pay | Admitting: Physician Assistant

## 2023-06-13 NOTE — Progress Notes (Unsigned)
 [facility]  [address] [place],  [place]  [address] [phone]  Clinic Day:  04/14/2023  Referring physician: Tamela Fake, MD   HISTORY OF PRESENT ILLNESS:  The patient is a 78 y.o. female with anemia that appears to be multifactorial, including being due to iron  deficiency and chronic renal insufficiency.  She comes in today to reassess her labs.  Of note, she last received IV iron  in January/February 2025.  Overall, the patient has been doing okay.  She denies having increased fatigue or any overt forms of blood loss which concern her for worsening anemia.  This patient also had a history of bilateral pulmonary emboli and an extensive left lower extremity DVT which were thought to have developed due to underlying oral contraception/herbal-based hormonal therapy.  A hypercoagulable workup done in the past came back negative for any clotting disorders.  She was previously on Eliquis.  She currently takes aspirin to prevent additional clots in the future.  PHYSICAL EXAM:  Blood pressure (!) 172/74, pulse 78, temperature 98 F (36.7 C), temperature source Oral, resp. rate 14, height 5\' 5"  (1.651 m), weight 179 lb 1.6 oz (81.2 kg), SpO2 100%. Wt Readings from Last 3 Encounters:  06/14/23 179 lb 1.6 oz (81.2 kg)  04/14/23 181 lb 3.2 oz (82.2 kg)  02/09/23 187 lb 1.3 oz (84.9 kg)   Body mass index is 29.8 kg/m. Performance status (ECOG): 1 - Symptomatic but completely ambulatory Physical Exam Constitutional:      Appearance: Normal appearance. She is not ill-appearing.  HENT:     Mouth/Throat:     Mouth: Mucous membranes are moist.     Pharynx: Oropharynx is clear. No oropharyngeal exudate or posterior oropharyngeal erythema.  Cardiovascular:     Rate and Rhythm: Normal rate and regular rhythm.     Heart sounds: No murmur heard.    No friction rub. No gallop.  Pulmonary:     Effort: Pulmonary effort is normal. No respiratory distress.      Breath sounds: Normal breath sounds. No wheezing, rhonchi or rales.  Abdominal:     General: Bowel sounds are normal. There is no distension.     Palpations: Abdomen is soft. There is no mass.     Tenderness: There is no abdominal tenderness.  Musculoskeletal:        General: No swelling.     Right lower leg: No edema.     Left lower leg: 2+ Edema present.  Lymphadenopathy:     Cervical: No cervical adenopathy.     Upper Body:     Right upper body: No supraclavicular or axillary adenopathy.     Left upper body: No supraclavicular or axillary adenopathy.     Lower Body: No right inguinal adenopathy. No left inguinal adenopathy.  Skin:    General: Skin is warm.     Coloration: Skin is not jaundiced.     Findings: No lesion or rash.  Neurological:     General: No focal deficit present.     Mental Status: She is alert and oriented to person, place, and time. Mental status is at baseline.  Psychiatric:        Mood and Affect: Mood normal.        Behavior: Behavior normal.        Thought Content: Thought content normal.    LABS:      Latest Ref Rng & Units 06/14/2023    1:42 PM 04/14/2023    1:10  PM 12/24/2022   12:22 PM  CBC  WBC 4.0 - 10.5 K/uL 4.7  5.2  7.4   Hemoglobin 12.0 - 15.0 g/dL 45.4  09.8  9.5   Hematocrit 36.0 - 46.0 % 31.5  31.6  28.2   Platelets 150 - 400 K/uL 337  283  373       Latest Ref Rng & Units 06/14/2023    1:42 PM 04/14/2023    1:10 PM 12/24/2022   12:22 PM  CMP  Glucose 70 - 99 mg/dL 119  147  83   BUN 8 - 23 mg/dL 34  28  31   Creatinine 0.44 - 1.00 mg/dL 8.29  5.62  1.30   Sodium 135 - 145 mmol/L 140  130  137   Potassium 3.5 - 5.1 mmol/L 4.4  3.9  4.5   Chloride 98 - 111 mmol/L 103  92  100   CO2 22 - 32 mmol/L 26  28  26    Calcium 8.9 - 10.3 mg/dL 9.3  8.6  9.2   Total Protein 6.5 - 8.1 g/dL 6.2  6.0  6.1   Total Bilirubin 0.0 - 1.2 mg/dL 0.4  0.4  0.3   Alkaline Phos 38 - 126 U/L 49  43  38   AST 15 - 41 U/L 29  34  38   ALT 0 - 44 U/L  13  15  15      Latest Reference Range & Units 06/14/23 13:42  Iron  28 - 170 ug/dL 79  UIBC ug/dL 865  TIBC 784 - 696 ug/dL 295  Saturation Ratios 10.4 - 31.8 % 18  Ferritin 11 - 307 ng/mL 37    ASSESSMENT & PLAN:  Assessment/Plan:  A 78 y.o. female with anemia secondary to iron  deficiency and chronic renal insufficiency.  I am pleased as her hemoglobin is above 10 today.  Based upon this, red cell shot therapy is not necessary.  Furthermore, her iron  parameters are holding stable.  There appears to be no need for a repeat IV iron  infusion.  Of note, I did talk to the patient about getting a Doppler ultrasound of her left leg to ensure another DVT has not developed within this extremity.  However, the patient declined wishing to have this done.  She claims she is scheduled to see her vein specialist early next week, who plans to do it at their office.  Overall, the patient appears to be doing fairly well.  Moving forward, her CBC will be checked every 2 months.  I will see her back in 4 months for repeat clinical assessment.  The patient understands all the plans discussed today and is in agreement with them.    Maelle Sheaffer Felicia Horde, MD

## 2023-06-14 ENCOUNTER — Other Ambulatory Visit: Payer: Self-pay | Admitting: Oncology

## 2023-06-14 ENCOUNTER — Inpatient Hospital Stay: Attending: Oncology | Admitting: Oncology

## 2023-06-14 ENCOUNTER — Inpatient Hospital Stay: Attending: Oncology

## 2023-06-14 VITALS — BP 172/74 | HR 78 | Temp 98.0°F | Resp 14 | Ht 65.0 in | Wt 179.1 lb

## 2023-06-14 DIAGNOSIS — D631 Anemia in chronic kidney disease: Secondary | ICD-10-CM | POA: Insufficient documentation

## 2023-06-14 DIAGNOSIS — D508 Other iron deficiency anemias: Secondary | ICD-10-CM

## 2023-06-14 DIAGNOSIS — N189 Chronic kidney disease, unspecified: Secondary | ICD-10-CM | POA: Diagnosis not present

## 2023-06-14 DIAGNOSIS — Z7901 Long term (current) use of anticoagulants: Secondary | ICD-10-CM | POA: Diagnosis not present

## 2023-06-14 DIAGNOSIS — Z86711 Personal history of pulmonary embolism: Secondary | ICD-10-CM | POA: Diagnosis not present

## 2023-06-14 DIAGNOSIS — Z86718 Personal history of other venous thrombosis and embolism: Secondary | ICD-10-CM | POA: Diagnosis not present

## 2023-06-14 DIAGNOSIS — Z79899 Other long term (current) drug therapy: Secondary | ICD-10-CM | POA: Diagnosis not present

## 2023-06-14 DIAGNOSIS — D649 Anemia, unspecified: Secondary | ICD-10-CM

## 2023-06-14 DIAGNOSIS — D509 Iron deficiency anemia, unspecified: Secondary | ICD-10-CM | POA: Insufficient documentation

## 2023-06-14 LAB — CBC WITH DIFFERENTIAL (CANCER CENTER ONLY)
Abs Immature Granulocytes: 0.01 10*3/uL (ref 0.00–0.07)
Basophils Absolute: 0.1 10*3/uL (ref 0.0–0.1)
Basophils Relative: 1 %
Eosinophils Absolute: 0.2 10*3/uL (ref 0.0–0.5)
Eosinophils Relative: 4 %
HCT: 31.5 % — ABNORMAL LOW (ref 36.0–46.0)
Hemoglobin: 10.2 g/dL — ABNORMAL LOW (ref 12.0–15.0)
Immature Granulocytes: 0 %
Lymphocytes Relative: 19 %
Lymphs Abs: 0.9 10*3/uL (ref 0.7–4.0)
MCH: 27.5 pg (ref 26.0–34.0)
MCHC: 32.4 g/dL (ref 30.0–36.0)
MCV: 84.9 fL (ref 80.0–100.0)
Monocytes Absolute: 0.4 10*3/uL (ref 0.1–1.0)
Monocytes Relative: 8 %
Neutro Abs: 3.2 10*3/uL (ref 1.7–7.7)
Neutrophils Relative %: 68 %
Platelet Count: 337 10*3/uL (ref 150–400)
RBC: 3.71 MIL/uL — ABNORMAL LOW (ref 3.87–5.11)
RDW: 15.6 % — ABNORMAL HIGH (ref 11.5–15.5)
WBC Count: 4.7 10*3/uL (ref 4.0–10.5)
nRBC: 0 % (ref 0.0–0.2)

## 2023-06-14 LAB — CMP (CANCER CENTER ONLY)
ALT: 13 U/L (ref 0–44)
AST: 29 U/L (ref 15–41)
Albumin: 4 g/dL (ref 3.5–5.0)
Alkaline Phosphatase: 49 U/L (ref 38–126)
Anion gap: 12 (ref 5–15)
BUN: 34 mg/dL — ABNORMAL HIGH (ref 8–23)
CO2: 26 mmol/L (ref 22–32)
Calcium: 9.3 mg/dL (ref 8.9–10.3)
Chloride: 103 mmol/L (ref 98–111)
Creatinine: 1.38 mg/dL — ABNORMAL HIGH (ref 0.44–1.00)
GFR, Estimated: 39 mL/min — ABNORMAL LOW (ref >60)
Glucose, Bld: 120 mg/dL — ABNORMAL HIGH (ref 70–99)
Potassium: 4.4 mmol/L (ref 3.5–5.1)
Sodium: 140 mmol/L (ref 135–145)
Total Bilirubin: 0.4 mg/dL (ref 0.0–1.2)
Total Protein: 6.2 g/dL — ABNORMAL LOW (ref 6.5–8.1)

## 2023-06-14 LAB — TSH: TSH: 0.205 u[IU]/mL — ABNORMAL LOW (ref 0.350–4.500)

## 2023-06-14 LAB — IRON AND TIBC
Iron: 79 ug/dL (ref 28–170)
Saturation Ratios: 18 % (ref 10.4–31.8)
TIBC: 437 ug/dL (ref 250–450)
UIBC: 358 ug/dL

## 2023-06-14 LAB — FERRITIN: Ferritin: 37 ng/mL (ref 11–307)

## 2023-06-15 ENCOUNTER — Telehealth: Payer: Self-pay

## 2023-06-15 ENCOUNTER — Telehealth: Payer: Self-pay | Admitting: Oncology

## 2023-06-15 NOTE — Telephone Encounter (Signed)
 Patient has been scheduled for follow-up visit per 06/14/23 LOS.  Pt aware of scheduled appt details.

## 2023-06-15 NOTE — Telephone Encounter (Signed)
 Latest Reference Range & Units 06/14/23 13:42  Iron  28 - 170 ug/dL 79  UIBC ug/dL 161  TIBC 096 - 045 ug/dL 409  Saturation Ratios 10.4 - 31.8 % 18  Ferritin 11 - 307 ng/mL 37  Assessment/Plan:  A 78 y.o. female with anemia secondary to iron  deficiency and chronic renal insufficiency.  I am pleased as her hemoglobin is above 10 today.  Based upon this, red cell shot therapy is not necessary.  Furthermore, her iron  parameters are holding stable.  There appears to be no need for a repeat IV iron  infusion.

## 2023-06-16 DIAGNOSIS — H353231 Exudative age-related macular degeneration, bilateral, with active choroidal neovascularization: Secondary | ICD-10-CM | POA: Diagnosis not present

## 2023-06-20 DIAGNOSIS — I872 Venous insufficiency (chronic) (peripheral): Secondary | ICD-10-CM | POA: Diagnosis not present

## 2023-06-22 ENCOUNTER — Ambulatory Visit: Admitting: Gastroenterology

## 2023-06-28 DIAGNOSIS — M79669 Pain in unspecified lower leg: Secondary | ICD-10-CM | POA: Diagnosis not present

## 2023-06-28 DIAGNOSIS — K219 Gastro-esophageal reflux disease without esophagitis: Secondary | ICD-10-CM | POA: Diagnosis not present

## 2023-06-28 DIAGNOSIS — J309 Allergic rhinitis, unspecified: Secondary | ICD-10-CM | POA: Diagnosis not present

## 2023-06-28 DIAGNOSIS — M7989 Other specified soft tissue disorders: Secondary | ICD-10-CM | POA: Diagnosis not present

## 2023-06-28 DIAGNOSIS — Z6829 Body mass index (BMI) 29.0-29.9, adult: Secondary | ICD-10-CM | POA: Diagnosis not present

## 2023-06-28 DIAGNOSIS — R413 Other amnesia: Secondary | ICD-10-CM | POA: Diagnosis not present

## 2023-06-28 DIAGNOSIS — E538 Deficiency of other specified B group vitamins: Secondary | ICD-10-CM | POA: Diagnosis not present

## 2023-06-28 DIAGNOSIS — I10 Essential (primary) hypertension: Secondary | ICD-10-CM | POA: Diagnosis not present

## 2023-06-30 DIAGNOSIS — I872 Venous insufficiency (chronic) (peripheral): Secondary | ICD-10-CM | POA: Diagnosis not present

## 2023-07-26 NOTE — Progress Notes (Addendum)
 07/27/2023 [name] [id number] 29-May-1945  Referring provider: Gable Cambric, MD Primary GI doctor: Dr. Charlanne  ASSESSMENT AND PLAN:  IDA with history of gastric ulcer 03/2023 and piecemeal removed cecal high-grade dysplasia polyp 09/2021 06/14/2023  HGB 10.2 MCV 84.9 Platelets 337 06/14/2023 Iron  79 Ferritin 37 B12 339 Recent Labs    12/24/22 1222 04/14/23 1310 06/14/23 1342  HGB 9.5* 10.0* 10.2*  Follows with hematology gets iron  infusions Follows with Dr. Ezzard in [place] Stopped eliquis , Has follow up August 1st -Continue to follow up with Dr. Ezzard for supportive care -Will get EGD/colon in the hospital due to motility issues and comorbid conditions Risk of bowel prep, conscious sedation, and EGD and colonoscopy were discussed.  Risks include but are not limited to dehydration, pain, bleeding, cardiopulmonary process, bowel perforation, or other possible adverse outcomes..  Treatment plan was discussed with patient, and agreed upon. - ER precautions discussed any further melena, vomiting blood, hematochezia, shortness of breath, chest pain go to the ER.  GERD with history of gastric ulcer 09/11/2021 EGD LA grade B esophagitis and hiatal hernia 03/07/2023 EGD for melena and coffee-ground emesis multiple clean-based ulcers negative H. Pylori started on pantoprazole  40 mg twice daily 6 weeks now on once a day suggested  No NSAIDs, no ETOH, off eliquis, on bASA q 3 day Some AB discomfort denies nausea,vomiting, GERD, dysphagia, melena - continue protonix  40 mg indefinitely -  repeat EGD with colonoscopy at the hospital  Personal history of colon polyps Colonoscopy 09/11/2021 by Dr. Larene in [place] [facility] significant cecal polyp removed piecemeal fashion showed focal high-grade dysplasia and internal hemorrhoids repeat colonoscopy recommended 3 months (12/2021) but never done Has BM every 2-3 days, denies constipation - will get Xray - consider miralax and 2 day prep for  colonoscopy at the hospital  History of DVT/PE  with venous insuff and chronic pain On bASA every 3 days, she is off eliquis since GI bleed On chronic tramadol  due to leg pain Dr. Gable will not refill without our permission - patient from a GI perspective can take the tramadol  with knowledge it can worsening her constipation - continue on miralax ER precautions discussed  Type 2 diabetes with CKD  Patient Care Team: Gable Cambric, MD as PCP - General (Internal Medicine) Monetta Redell PARAS, MD as PCP - Cardiology (Cardiology) Ezzard Valaria LABOR, MD as Consulting Physician (Oncology)  HISTORY OF PRESENT ILLNESS: 78 y.o. female with a past medical history of type 2 diabetes, CKD, history of DVT PE on Eliquis, history of IDA followed by heme-onc receives iron  transfusions, GERD and others listed below presents for evaluation of gastric ulcer.   Patient transferring care to Dr. Charlanne from [facility].  Last seen 04/13/2023 for epigastric pain and gastric ulcer.  Discussed the use of AI scribe software for clinical note transcription with the patient, who gave verbal consent to proceed.  History of Present Illness   [name] is a 78 year old female with gastric ulcers who presents for follow-up.  She was previously admitted to [facility] for melanoma and experienced coffee ground emesis, leading to an EGD on March 07, 2023, which revealed multiple clean-based ulcers. Pathology was negative for H. pylori. She was treated with pantoprazole  40 mg twice daily, later reduced to once daily.  Since her hospitalization, she has minor epigastric abdominal discomfort. No nausea, vomiting, heartburn, dysphagia, or melena. She has regular bowel movements, two to three times daily, with soft stools,  though certain foods can cause constipation. Her weight and appetite are stable, and she is not currently on oral iron .  Her hematologist has discontinued Eliquis, and she is now on low-dose aspirin  every three days. No NSAID or alcohol use. She has a history of tramadol  use for venous insufficiency secondary to multiple DVTs, but her physician will not refill it without permission.  No chest pain or shortness of breath.      She  reports that she has never smoked. She has never used smokeless tobacco. She reports that she does not drink alcohol and does not use drugs.  RELEVANT GI HISTORY, IMAGING AND LABS: Results   EGD: Multiple clean-based ulcers (03/07/2023)  Pathology: Negative for H. pylori (03/07/2023)      CBC    Component Value Date/Time   WBC 4.7 06/14/2023 1342   WBC 7.9 09/12/2010 0355   RBC 3.71 (L) 06/14/2023 1342   HGB 10.2 (L) 06/14/2023 1342   HCT 31.5 (L) 06/14/2023 1342   PLT 337 06/14/2023 1342   MCV 84.9 06/14/2023 1342   MCH 27.5 06/14/2023 1342   MCHC 32.4 06/14/2023 1342   RDW 15.6 (H) 06/14/2023 1342   LYMPHSABS 0.9 06/14/2023 1342   MONOABS 0.4 06/14/2023 1342   EOSABS 0.2 06/14/2023 1342   BASOSABS 0.1 06/14/2023 1342   Recent Labs    12/24/22 1222 04/14/23 1310 06/14/23 1342  HGB 9.5* 10.0* 10.2*    CMP     Component Value Date/Time   NA 140 06/14/2023 1342   K 4.4 06/14/2023 1342   CL 103 06/14/2023 1342   CO2 26 06/14/2023 1342   GLUCOSE 120 (H) 06/14/2023 1342   BUN 34 (H) 06/14/2023 1342   CREATININE 1.38 (H) 06/14/2023 1342   CALCIUM 9.3 06/14/2023 1342   PROT 6.2 (L) 06/14/2023 1342   ALBUMIN 4.0 06/14/2023 1342   AST 29 06/14/2023 1342   ALT 13 06/14/2023 1342   ALKPHOS 49 06/14/2023 1342   BILITOT 0.4 06/14/2023 1342   GFRNONAA 39 (L) 06/14/2023 1342   GFRAA >60 09/12/2010 0355      Latest Ref Rng & Units 06/14/2023    1:42 PM 04/14/2023    1:10 PM 12/24/2022   12:22 PM  Hepatic Function  Total Protein 6.5 - 8.1 g/dL 6.2  6.0  6.1   Albumin 3.5 - 5.0 g/dL 4.0  3.7  3.7   AST 15 - 41 U/L 29  34  38   ALT 0 - 44 U/L 13  15  15    Alk Phosphatase 38 - 126 U/L 49  43  38   Total Bilirubin 0.0 - 1.2 mg/dL 0.4   0.4  0.3       Current Medications:    Current Outpatient Medications (Cardiovascular):    carvedilol (COREG) 3.125 MG tablet, Take 3.125 mg by mouth 2 (two) times daily with a meal.   Choline Fenofibrate (FENOFIBRIC ACID) 135 MG CPDR, Take by mouth.   fenofibrate micronized (LOFIBRA) 134 MG capsule, Take 134 mg by mouth daily before breakfast.   furosemide (LASIX) 40 MG tablet, Take 40 mg by mouth 2 (two) times daily.   losartan (COZAAR) 50 MG tablet, Take 50 mg by mouth daily.   rosuvastatin (CRESTOR) 5 MG tablet, Take 2.5 mg by mouth 2 (two) times a week.  Current Outpatient Medications (Respiratory):    albuterol  (VENTOLIN  HFA) 108 (90 Base) MCG/ACT inhaler, Inhale into the lungs every 6 (six) hours as needed for wheezing or  shortness of breath.   Cetirizine HCl (ZYRTEC PO), Take 1 tablet by mouth daily.   fluticasone (FLONASE) 50 MCG/ACT nasal spray, Place 2 sprays into both nostrils daily.   montelukast (SINGULAIR) 10 MG tablet, Take 10 mg by mouth at bedtime.  Current Outpatient Medications (Analgesics):    aspirin EC 81 MG tablet, Take 81 mg by mouth daily. Swallow whole.   traMADol  (ULTRAM ) 50 MG tablet, Take 1 tablet (50 mg total) by mouth every 6 (six) hours as needed.  Current Outpatient Medications (Hematological):    cyanocobalamin  (VITAMIN B12) 1000 MCG tablet, Take 1,000 mcg by mouth every 30 (thirty) days.   apixaban (ELIQUIS) 5 MG TABS tablet, Take 5 mg by mouth daily. (Patient not taking: Reported on 07/27/2023)  Current Outpatient Medications (Other):    citalopram (CELEXA) 20 MG tablet, Take 20 mg by mouth daily.   cyclobenzaprine (FLEXERIL) 10 MG tablet, Take 10 mg by mouth 2 (two) times daily as needed for muscle spasms.   Diclofenac Sodium 3 % GEL, Apply 1 Application topically daily as needed (pain).   ergocalciferol (VITAMIN D2) 1.25 MG (50000 UT) capsule, Take 50,000 Units by mouth once a week.   LORazepam (ATIVAN) 0.5 MG tablet, Take 0.5-0.75 mg by mouth  at bedtime.   Na Sulfate-K Sulfate-Mg Sulfate concentrate (SUPREP BOWEL PREP KIT) 17.5-3.13-1.6 GM/177ML SOLN, Take 1 kit (354 mLs total) by mouth once for 1 dose.   Potassium Chloride CR (MICRO-K) 8 MEQ CPCR capsule CR, Take 8 mEq by mouth as needed (leg cramps).   pantoprazole  (PROTONIX ) 20 MG tablet, Take 2 tablets (40 mg total) by mouth daily.  Medical History:  Past Medical History:  Diagnosis Date   Anxiety    Arthritis    Chronic cough    Sinus CT ned 03-19-10; allergy eval neg 03-19-2010>>>IgE 38 pos only for grass; Singulair tiral 03-19-2010   Depression    Diabetes mellitus without complication (HCC)    DVT (deep venous thrombosis) (HCC)    GERD (gastroesophageal reflux disease)    Hilar adenopathy    CT 02-24-2010   Hyperlipidemia    Hypertension    Other and unspecified hyperlipidemia    Other pulmonary embolism and infarction    Pneumonia, organism unspecified(486)    Pulmonary embolism (HCC)    Unspecified asthma(493.90)    Vitamin B12 deficiency    Allergies:  Allergies  Allergen Reactions   Gabapentin    Prednisone     REACTION: mood swings     Surgical History:  She  has a past surgical history that includes left shoulder (01/04/1993); Knee arthroscopy; left knee replacement (01/05/2008); Nasal sinus surgery (age 54); Tubal ligation (age 63); Tonsillectomy (1987); Cataract extraction (Right); and Trigger finger release (Right, 01/20/2023). Family History:  Her family history includes Breast cancer in her cousin; Cancer in her brother; Colon cancer in her cousin; Heart disease in her mother; Hyperlipidemia in her mother; Hypertension in her mother; Rheum arthritis in her mother.  REVIEW OF SYSTEMS  : All other systems reviewed and negative except where noted in the History of Present Illness.  PHYSICAL EXAM: BP (!) 162/68   Pulse 70   Ht 5' 5 (1.651 m)   Wt 179 lb 12.8 oz (81.6 kg)   BMI 29.92 kg/m  General Appearance: Elderly, in no apparent  distress. Respiratory: Respiratory effort normal, BS equal bilaterally without rales, rhonchi, wheezing. Cardio: RRR with no MRGs. Bilateral lower non pitting edema. Abdomen: Soft,  Obese ,active bowel sounds. No tenderness .  Without guarding and Without rebound. No masses. Rectal: Not evaluated Musculoskeletal: Full ROM, walks with walker, unable to get on table in exam room even with assistance Neuro: Alert and  oriented x4;  No focal deficits. Psych:  Cooperative. Normal mood and affect.    Alan JONELLE Coombs, PA-C 11:56 AM

## 2023-07-27 ENCOUNTER — Encounter: Payer: Self-pay | Admitting: Physician Assistant

## 2023-07-27 ENCOUNTER — Ambulatory Visit (INDEPENDENT_AMBULATORY_CARE_PROVIDER_SITE_OTHER)
Admission: RE | Admit: 2023-07-27 | Discharge: 2023-07-27 | Disposition: A | Discharge status: Home / Self Care | Source: Ambulatory Visit | Attending: Physician Assistant | Admitting: Physician Assistant

## 2023-07-27 ENCOUNTER — Ambulatory Visit: Admitting: Physician Assistant

## 2023-07-27 VITALS — BP 162/68 | HR 70 | Ht 65.0 in | Wt 179.8 lb

## 2023-07-27 DIAGNOSIS — D126 Benign neoplasm of colon, unspecified: Secondary | ICD-10-CM

## 2023-07-27 DIAGNOSIS — K259 Gastric ulcer, unspecified as acute or chronic, without hemorrhage or perforation: Secondary | ICD-10-CM

## 2023-07-27 DIAGNOSIS — E119 Type 2 diabetes mellitus without complications: Secondary | ICD-10-CM | POA: Diagnosis not present

## 2023-07-27 DIAGNOSIS — K219 Gastro-esophageal reflux disease without esophagitis: Secondary | ICD-10-CM

## 2023-07-27 DIAGNOSIS — R140 Abdominal distension (gaseous): Secondary | ICD-10-CM | POA: Diagnosis not present

## 2023-07-27 DIAGNOSIS — D509 Iron deficiency anemia, unspecified: Secondary | ICD-10-CM

## 2023-07-27 MED ORDER — PANTOPRAZOLE SODIUM 20 MG PO TBEC: 40.0000 mg | DELAYED_RELEASE_TABLET | Freq: Every day | ORAL | 3 refills | Status: DC

## 2023-07-27 MED ORDER — NA SULFATE-K SULFATE-MG SULF 17.5-3.13-1.6 GM/177ML PO SOLN: 1.0000 | Freq: Once | ORAL | 0 refills | Status: AC

## 2023-07-27 NOTE — Patient Instructions (Addendum)
 Your provider has requested that you have an abdominal x ray before leaving today. Please go to the basement floor to our Radiology department for the test.   Please take your proton pump inhibitor medication, protonix  40 mg daily  Please take this medication 30 minutes to 1 hour before meals- this makes it more effective.  Avoid spicy and acidic foods Avoid fatty foods Limit your intake of coffee, tea, alcohol, and carbonated drinks Work to maintain a healthy weight Keep the head of the bed elevated at least 3 inches with blocks or a wedge pillow if you are having any nighttime symptoms Stay upright for 2 hours after eating Avoid meals and snacks three to four hours before bedtime  Miralax is an osmotic laxative.  It only brings more water into the stool.  This is safe to take daily.  Can take up to 17 gram of miralax twice a day.  Mix with juice or coffee.  Start 1 capful at night for 3-4 days and reassess your response in 3-4 days.  You can increase and decrease the dose based on your response.  Remember, it can take up to 3-4 days to take effect OR for the effects to wear off.   I often pair this with benefiber in the morning to help assure the stool is not too loose.  Toileting tips to help with your constipation - Drink at least 64-80 ounces of water/liquid per day. - Establish a time to try to move your bowels every day.  For many people, this is after a cup of coffee or after a meal such as breakfast. - Sit all of the way back on the toilet keeping your back fairly straight and while sitting up, try to rest the tops of your forearms on your upper thighs.   - Raising your feet with a step stool/squatty potty can be helpful to improve the angle that allows your stool to pass through the rectum. - Relax the rectum feeling it bulge toward the toilet water.  If you feel your rectum raising toward your body, you are contracting rather than relaxing. - Breathe in and slowly exhale.  Belly breath by expanding your belly towards your belly button. Keep belly expanded as you gently direct pressure down and back to the anus.  A low pitched GRRR sound can assist with increasing intra-abdominal pressure.  (Can also trying to blow on a pinwheel and make it move, this helps with the same belly breathing) - Repeat 3-4 times. If unsuccessful, contract the pelvic floor to restore normal tone and get off the toilet.  Avoid excessive straining. - To reduce excessive wiping by teaching your anus to normally contract, place hands on outer aspect of knees and resist knee movement outward.  Hold 5-10 second then place hands just inside of knees and resist inward movement of knees.  Hold 5 seconds.  Repeat a few times each way.  Go to the ER if unable to pass gas, severe AB pain, unable to hold down food, any shortness of breath of chest pain.  You have been scheduled for an endoscopy and colonoscopy. Please follow the written instructions given to you at your visit today.  If you use inhalers (even only as needed), please bring them with you on the day of your procedure.  DO NOT TAKE 7 DAYS PRIOR TO TEST- Trulicity (dulaglutide) Ozempic, Wegovy (semaglutide) Mounjaro (tirzepatide) Bydureon Bcise (exanatide extended release)  DO NOT TAKE 1 DAY PRIOR TO YOUR TEST Rybelsus (  semaglutide) Adlyxin (lixisenatide) Victoza (liraglutide) Byetta (exanatide) ___________________________________________________________________________  [name] have been scheduled for a hospital Colonoscopy/Endoscopy at [facility] on 10/20/2023 at 7:30 am, Separate instructions have been given   You will be contacted by our office prior to your procedure for directions on holding your ELIQUIS.  If you do not hear from our office 1 week prior to your scheduled procedure, please call [phone] to discuss.   Due to recent changes in healthcare laws, you may see the results of your imaging and laboratory  studies on MyChart before your provider has had a chance to review them.  We understand that in some cases there may be results that are confusing or concerning to you. Not all laboratory results come back in the same time frame and the provider may be waiting for multiple results in order to interpret others.  Please give us  48 hours in order for your provider to thoroughly review all the results before contacting the office for clarification of your results.    I appreciate the  opportunity to care for you  Thank You   [facility]

## 2023-07-28 ENCOUNTER — Ambulatory Visit: Payer: Self-pay | Admitting: Physician Assistant

## 2023-07-28 DIAGNOSIS — E538 Deficiency of other specified B group vitamins: Secondary | ICD-10-CM | POA: Diagnosis not present

## 2023-07-29 ENCOUNTER — Telehealth: Payer: Self-pay | Admitting: *Deleted

## 2023-07-29 NOTE — Telephone Encounter (Signed)
 Pharmacy please advise on holding Eliquis prior to endocopy scheduled for 10/20/2023. Thank you.

## 2023-07-29 NOTE — Telephone Encounter (Signed)
 [facility] Pre-operative Risk Assessment     Request for surgical clearance:     Endoscopy Procedure  What type of surgery is being performed?     Colonoscopy/Endoscopy   When is this surgery scheduled?     10/20/2023  What type of clearance is required ?   Pharmacy  Are there any medications that need to be held prior to surgery and how long? Eliquis 2 days  Practice name and name of physician performing surgery?      [facility]  What is your office phone and fax number?      Phone- [phone]  Fax- [phone]  Anesthesia type (None, local, MAC, general) ?       MAC   Please route your response to [employer]

## 2023-08-01 ENCOUNTER — Telehealth: Payer: Self-pay

## 2023-08-01 MED ORDER — PANTOPRAZOLE SODIUM 40 MG PO TBEC: 40.0000 mg | DELAYED_RELEASE_TABLET | Freq: Every day | ORAL | 3 refills | Status: DC

## 2023-08-01 NOTE — Telephone Encounter (Signed)
 Patient called into the office this morning regarding Pantoprazole  prescription. Patient states that she typically takes 40 mg tablet but 20 mg tablets were sent to her pharmacy and it caused her to have dry mouth. Patient requested a new prescription for Pantoprazole  40 mg tablet. Patient confirmed pharmacy on file and had no other concerns.

## 2023-08-03 NOTE — Telephone Encounter (Signed)
   Patient Name: [name]  DOB: 01/31/1945 MRN: [id number]  Primary Cardiologist: Redell Leiter, MD  Clinical pharmacists have reviewed the patient's past medical history, labs, and current medications as part of preoperative protocol coverage. The following recommendations have been made:   Patient with diagnosis of DVT/PE on Eliquis for anticoagulation.  Med list says patient is not currently taking Eliquis.   Procedure: Colonoscopy/Endoscopy  Date of procedure: 10/20/23     CrCl 43 ml/min Platelet count 337K   Per office protocol, patient can hold Eliquis for 2 days prior to procedure.     I will route this recommendation to the requesting party via Epic fax function and remove from pre-op pool.  Please call with questions.  Lamarr Satterfield, NP 08/03/2023, 10:13 AM

## 2023-08-03 NOTE — Telephone Encounter (Signed)
 Confirmed with patient she is no longer on Eliquis as stated by [facility]  , removed from her list so pop-up would discontinue

## 2023-08-03 NOTE — Telephone Encounter (Signed)
Called patient and left a message for her to return my call.

## 2023-08-03 NOTE — Addendum Note (Signed)
 Addended by: BETTIE GRAYCE HERO on: 08/03/2023 03:01 PM   Modules accepted: Orders

## 2023-08-03 NOTE — Telephone Encounter (Signed)
 Patient with diagnosis of DVT/PE on Eliquis for anticoagulation.  Med list says patient is not currently taking Eliquis.  Procedure: Colonoscopy/Endoscopy  Date of procedure: 10/20/23    CrCl 43 ml/min Platelet count 337K  Per office protocol, patient can hold Eliquis for 2 days prior to procedure.    **This guidance is not considered finalized until pre-operative APP has relayed final recommendations.**

## 2023-08-03 NOTE — Telephone Encounter (Signed)
 [name]

## 2023-08-04 DIAGNOSIS — H353231 Exudative age-related macular degeneration, bilateral, with active choroidal neovascularization: Secondary | ICD-10-CM | POA: Diagnosis not present

## 2023-08-05 ENCOUNTER — Telehealth: Payer: Self-pay | Admitting: Physician Assistant

## 2023-08-05 NOTE — Telephone Encounter (Signed)
 Patient is calling due her being put on tramadol  by Dr. Charlanne, and is now requesting that we call the living facility where she is currently living at in order for her to be able to take her tramadol . Patient is requesting to speak to Dr. Charlanne or his nurse. Please advise.

## 2023-08-05 NOTE — Telephone Encounter (Signed)
 Unable to reach pt. Unable to leave pt voice message.

## 2023-08-08 NOTE — Telephone Encounter (Signed)
 2nd attempt to reach patient/ Mailbox is full, unable to leave a vm. Looks like Tramadol  was last prescribed by patient's orthopedic.

## 2023-08-09 NOTE — Telephone Encounter (Signed)
 Fax sent to Dr. Gable through Epic. Pt made aware.  Pt verbalized understanding with all questions answered.

## 2023-08-09 NOTE — Telephone Encounter (Signed)
 Pt stated that her PCP will not prescribe the Tramadol  for the pt due to her GI issues and history without an approval from her GI provider. Pt chart was reviewed and noted that pt had an office visit on 07/27/2023 with Alan Coombs PA.  Notes from that office visit stated the following.  History of DVT/PE  with venous insuff and chronic pain On bASA every 3 days, she is off eliquis since GI bleed On chronic tramadol  due to leg pain Dr. Gable will not refill without our permission ER precautions discussed  Please review and advise

## 2023-08-15 ENCOUNTER — Inpatient Hospital Stay: Attending: Oncology

## 2023-08-15 DIAGNOSIS — Z79899 Other long term (current) drug therapy: Secondary | ICD-10-CM | POA: Insufficient documentation

## 2023-08-15 DIAGNOSIS — D649 Anemia, unspecified: Secondary | ICD-10-CM

## 2023-08-15 DIAGNOSIS — N189 Chronic kidney disease, unspecified: Secondary | ICD-10-CM | POA: Insufficient documentation

## 2023-08-15 DIAGNOSIS — D509 Iron deficiency anemia, unspecified: Secondary | ICD-10-CM | POA: Insufficient documentation

## 2023-08-15 DIAGNOSIS — D508 Other iron deficiency anemias: Secondary | ICD-10-CM

## 2023-08-15 DIAGNOSIS — D631 Anemia in chronic kidney disease: Secondary | ICD-10-CM | POA: Diagnosis not present

## 2023-08-15 LAB — CBC WITH DIFFERENTIAL (CANCER CENTER ONLY)
Abs Immature Granulocytes: 0.01 K/uL (ref 0.00–0.07)
Basophils Absolute: 0.1 K/uL (ref 0.0–0.1)
Basophils Relative: 1 %
Eosinophils Absolute: 0.2 K/uL (ref 0.0–0.5)
Eosinophils Relative: 4 %
HCT: 31 % — ABNORMAL LOW (ref 36.0–46.0)
Hemoglobin: 9.9 g/dL — ABNORMAL LOW (ref 12.0–15.0)
Immature Granulocytes: 0 %
Lymphocytes Relative: 24 %
Lymphs Abs: 1.2 K/uL (ref 0.7–4.0)
MCH: 26.5 pg (ref 26.0–34.0)
MCHC: 31.9 g/dL (ref 30.0–36.0)
MCV: 83.1 fL (ref 80.0–100.0)
Monocytes Absolute: 0.5 K/uL (ref 0.1–1.0)
Monocytes Relative: 10 %
Neutro Abs: 3 K/uL (ref 1.7–7.7)
Neutrophils Relative %: 61 %
Platelet Count: 315 K/uL (ref 150–400)
RBC: 3.73 MIL/uL — ABNORMAL LOW (ref 3.87–5.11)
RDW: 14.6 % (ref 11.5–15.5)
WBC Count: 5 K/uL (ref 4.0–10.5)
nRBC: 0 % (ref 0.0–0.2)

## 2023-08-15 LAB — TSH: TSH: <0.01 u[IU]/mL — ABNORMAL LOW (ref 0.350–4.500)

## 2023-08-26 ENCOUNTER — Other Ambulatory Visit: Payer: Self-pay

## 2023-08-26 ENCOUNTER — Telehealth: Payer: Self-pay

## 2023-08-26 DIAGNOSIS — R5383 Other fatigue: Secondary | ICD-10-CM

## 2023-08-26 DIAGNOSIS — D509 Iron deficiency anemia, unspecified: Secondary | ICD-10-CM

## 2023-08-26 NOTE — Telephone Encounter (Signed)
 Latest Reference Range & Units 08/15/23 13:51  WBC 4.0 - 10.5 K/uL 5.0  RBC 3.87 - 5.11 MIL/uL 3.73 (L)  Hemoglobin 12.0 - 15.0 g/dL 9.9 (L)  HCT 63.9 - 53.9 % 31.0 (L)  MCV 80.0 - 100.0 fL 83.1  MCH 26.0 - 34.0 pg 26.5  MCHC 30.0 - 36.0 g/dL 68.0  RDW 88.4 - 84.4 % 14.6  Platelets 150 - 400 K/uL 315  nRBC 0.0 - 0.2 % 0.0     Latest Reference Range & Units 08/15/23 13:51  TSH 0.350 - 4.500 uIU/mL <0.010 (L)  (L): Data is abnormally low  (L): Data is abnormally low   Dr Ezzard reviewed labs. He is fine with pt coming in to have iron  panel drawn.

## 2023-09-01 DIAGNOSIS — E538 Deficiency of other specified B group vitamins: Secondary | ICD-10-CM | POA: Diagnosis not present

## 2023-09-01 DIAGNOSIS — K219 Gastro-esophageal reflux disease without esophagitis: Secondary | ICD-10-CM | POA: Diagnosis not present

## 2023-09-01 DIAGNOSIS — Z6829 Body mass index (BMI) 29.0-29.9, adult: Secondary | ICD-10-CM | POA: Diagnosis not present

## 2023-09-01 DIAGNOSIS — Z79899 Other long term (current) drug therapy: Secondary | ICD-10-CM | POA: Diagnosis not present

## 2023-09-01 DIAGNOSIS — D649 Anemia, unspecified: Secondary | ICD-10-CM | POA: Diagnosis not present

## 2023-09-01 DIAGNOSIS — E1121 Type 2 diabetes mellitus with diabetic nephropathy: Secondary | ICD-10-CM | POA: Diagnosis not present

## 2023-09-01 DIAGNOSIS — F419 Anxiety disorder, unspecified: Secondary | ICD-10-CM | POA: Diagnosis not present

## 2023-09-01 DIAGNOSIS — E785 Hyperlipidemia, unspecified: Secondary | ICD-10-CM | POA: Diagnosis not present

## 2023-09-01 DIAGNOSIS — E559 Vitamin D deficiency, unspecified: Secondary | ICD-10-CM | POA: Diagnosis not present

## 2023-09-01 DIAGNOSIS — M159 Polyosteoarthritis, unspecified: Secondary | ICD-10-CM | POA: Diagnosis not present

## 2023-09-29 DIAGNOSIS — H353231 Exudative age-related macular degeneration, bilateral, with active choroidal neovascularization: Secondary | ICD-10-CM | POA: Diagnosis not present

## 2023-10-06 DIAGNOSIS — E559 Vitamin D deficiency, unspecified: Secondary | ICD-10-CM | POA: Diagnosis not present

## 2023-10-06 DIAGNOSIS — I10 Essential (primary) hypertension: Secondary | ICD-10-CM | POA: Diagnosis not present

## 2023-10-06 DIAGNOSIS — M549 Dorsalgia, unspecified: Secondary | ICD-10-CM | POA: Diagnosis not present

## 2023-10-06 DIAGNOSIS — E785 Hyperlipidemia, unspecified: Secondary | ICD-10-CM | POA: Diagnosis not present

## 2023-10-06 DIAGNOSIS — E538 Deficiency of other specified B group vitamins: Secondary | ICD-10-CM | POA: Diagnosis not present

## 2023-10-06 DIAGNOSIS — G8929 Other chronic pain: Secondary | ICD-10-CM | POA: Diagnosis not present

## 2023-10-12 ENCOUNTER — Telehealth: Payer: Self-pay | Admitting: Gastroenterology

## 2023-10-12 NOTE — Telephone Encounter (Addendum)
 Procedure:Colonoscopy/Endoscopy Procedure date: 10/20/23 Procedure location: [facility] Arrival Time: 6:00 am Spoke with the patient Y/N:   No, I left a detailed message on [phone] on 10/12/23 @ 10:56 am for the patient to return call called [phone], mailbox full, couldn't leave a message. Yes, 10/13/23 @ 9:23 am  Any prep concerns? No  Has the patient obtained the prep from the pharmacy ? Yes Do you have a care partner and transportation: Yes Any additional concerns? No

## 2023-10-13 ENCOUNTER — Encounter (HOSPITAL_COMMUNITY): Payer: Self-pay | Admitting: Gastroenterology

## 2023-10-14 ENCOUNTER — Inpatient Hospital Stay: Admitting: Oncology

## 2023-10-14 ENCOUNTER — Inpatient Hospital Stay

## 2023-10-16 NOTE — Progress Notes (Unsigned)
 [facility]  [address] [place],  [place]  [address] [phone]  Clinic Day:  04/14/2023  Referring physician: Gable Cambric, MD   HISTORY OF PRESENT ILLNESS:  The patient is a 78 y.o. female with anemia that appears to be multifactorial, including being due to iron  deficiency and chronic renal insufficiency.  She comes in today to reassess her labs.  Of note, she last received IV iron  in January/February 2025.  Overall, the patient has been doing okay.  She denies having increased fatigue or any overt forms of blood loss which concern her for worsening anemia.  This patient also had a history of bilateral pulmonary emboli and an extensive left lower extremity DVT which were thought to have developed due to underlying oral contraception/herbal-based hormonal therapy.  A hypercoagulable workup done in the past came back negative for any clotting disorders.  She was previously on Eliquis.  She currently takes aspirin to prevent additional clots in the future.  PHYSICAL EXAM:  There were no vitals taken for this visit. Wt Readings from Last 3 Encounters:  07/27/23 179 lb 12.8 oz (81.6 kg)  06/14/23 179 lb 1.6 oz (81.2 kg)  04/14/23 181 lb 3.2 oz (82.2 kg)   There is no height or weight on file to calculate BMI. Performance status (ECOG): 1 - Symptomatic but completely ambulatory Physical Exam Constitutional:      Appearance: Normal appearance. She is not ill-appearing.  HENT:     Mouth/Throat:     Mouth: Mucous membranes are moist.     Pharynx: Oropharynx is clear. No oropharyngeal exudate or posterior oropharyngeal erythema.  Cardiovascular:     Rate and Rhythm: Normal rate and regular rhythm.     Heart sounds: No murmur heard.    No friction rub. No gallop.  Pulmonary:     Effort: Pulmonary effort is normal. No respiratory distress.     Breath sounds: Normal breath sounds. No wheezing, rhonchi or rales.  Abdominal:     General: Bowel sounds  are normal. There is no distension.     Palpations: Abdomen is soft. There is no mass.     Tenderness: There is no abdominal tenderness.  Musculoskeletal:        General: No swelling.     Right lower leg: No edema.     Left lower leg: 2+ Edema present.  Lymphadenopathy:     Cervical: No cervical adenopathy.     Upper Body:     Right upper body: No supraclavicular or axillary adenopathy.     Left upper body: No supraclavicular or axillary adenopathy.     Lower Body: No right inguinal adenopathy. No left inguinal adenopathy.  Skin:    General: Skin is warm.     Coloration: Skin is not jaundiced.     Findings: No lesion or rash.  Neurological:     General: No focal deficit present.     Mental Status: She is alert and oriented to person, place, and time. Mental status is at baseline.  Psychiatric:        Mood and Affect: Mood normal.        Behavior: Behavior normal.        Thought Content: Thought content normal.    LABS:      Latest Ref Rng & Units 08/15/2023    1:51 PM 06/14/2023    1:42 PM 04/14/2023    1:10 PM  CBC  WBC 4.0 - 10.5 K/uL 5.0  4.7  5.2   Hemoglobin 12.0 - 15.0 g/dL 9.9  89.7  89.9   Hematocrit 36.0 - 46.0 % 31.0  31.5  31.6   Platelets 150 - 400 K/uL 315  337  283       Latest Ref Rng & Units 06/14/2023    1:42 PM 04/14/2023    1:10 PM 12/24/2022   12:22 PM  CMP  Glucose 70 - 99 mg/dL 879  870  83   BUN 8 - 23 mg/dL 34  28  31   Creatinine 0.44 - 1.00 mg/dL 8.61  8.38  8.70   Sodium 135 - 145 mmol/L 140  130  137   Potassium 3.5 - 5.1 mmol/L 4.4  3.9  4.5   Chloride 98 - 111 mmol/L 103  92  100   CO2 22 - 32 mmol/L 26  28  26    Calcium 8.9 - 10.3 mg/dL 9.3  8.6  9.2   Total Protein 6.5 - 8.1 g/dL 6.2  6.0  6.1   Total Bilirubin 0.0 - 1.2 mg/dL 0.4  0.4  0.3   Alkaline Phos 38 - 126 U/L 49  43  38   AST 15 - 41 U/L 29  34  38   ALT 0 - 44 U/L 13  15  15      Latest Reference Range & Units 06/14/23 13:42  Iron  28 - 170 ug/dL 79  UIBC ug/dL 641   TIBC 749 - 549 ug/dL 562  Saturation Ratios 10.4 - 31.8 % 18  Ferritin 11 - 307 ng/mL 37    ASSESSMENT & PLAN:  Assessment/Plan:  A 78 y.o. female with anemia secondary to iron  deficiency and chronic renal insufficiency.  I am pleased as her hemoglobin is above 10 today.  Based upon this, red cell shot therapy is not necessary.  Furthermore, her iron  parameters are holding stable.  There appears to be no need for a repeat IV iron  infusion.  Of note, I did talk to the patient about getting a Doppler ultrasound of her left leg to ensure another DVT has not developed within this extremity.  However, the patient declined wishing to have this done.  She claims she is scheduled to see her vein specialist early next week, who plans to do it at their office.  Overall, the patient appears to be doing fairly well.  Moving forward, her CBC will be checked every 2 months.  I will see her back in 4 months for repeat clinical assessment.  The patient understands all the plans discussed today and is in agreement with them.    Hazen Brumett DELENA Kerns, MD

## 2023-10-17 ENCOUNTER — Inpatient Hospital Stay: Admitting: Oncology

## 2023-10-17 ENCOUNTER — Inpatient Hospital Stay: Attending: Oncology

## 2023-10-17 ENCOUNTER — Other Ambulatory Visit: Payer: Self-pay | Admitting: Oncology

## 2023-10-17 VITALS — BP 155/59 | HR 78 | Temp 98.1°F | Resp 14 | Ht 65.0 in | Wt 168.0 lb

## 2023-10-17 DIAGNOSIS — R634 Abnormal weight loss: Secondary | ICD-10-CM | POA: Diagnosis not present

## 2023-10-17 DIAGNOSIS — Z86711 Personal history of pulmonary embolism: Secondary | ICD-10-CM | POA: Diagnosis not present

## 2023-10-17 DIAGNOSIS — D649 Anemia, unspecified: Secondary | ICD-10-CM

## 2023-10-17 DIAGNOSIS — D631 Anemia in chronic kidney disease: Secondary | ICD-10-CM | POA: Insufficient documentation

## 2023-10-17 DIAGNOSIS — E0590 Thyrotoxicosis, unspecified without thyrotoxic crisis or storm: Secondary | ICD-10-CM | POA: Diagnosis not present

## 2023-10-17 DIAGNOSIS — Z79899 Other long term (current) drug therapy: Secondary | ICD-10-CM | POA: Insufficient documentation

## 2023-10-17 DIAGNOSIS — Z86718 Personal history of other venous thrombosis and embolism: Secondary | ICD-10-CM | POA: Diagnosis not present

## 2023-10-17 DIAGNOSIS — N189 Chronic kidney disease, unspecified: Secondary | ICD-10-CM | POA: Diagnosis not present

## 2023-10-17 DIAGNOSIS — D508 Other iron deficiency anemias: Secondary | ICD-10-CM

## 2023-10-17 DIAGNOSIS — D509 Iron deficiency anemia, unspecified: Secondary | ICD-10-CM | POA: Diagnosis not present

## 2023-10-17 LAB — CBC WITH DIFFERENTIAL (CANCER CENTER ONLY)
Abs Immature Granulocytes: 0 K/uL (ref 0.00–0.07)
Basophils Absolute: 0.1 K/uL (ref 0.0–0.1)
Basophils Relative: 1 %
Eosinophils Absolute: 0.2 K/uL (ref 0.0–0.5)
Eosinophils Relative: 4 %
HCT: 32.3 % — ABNORMAL LOW (ref 36.0–46.0)
Hemoglobin: 10.4 g/dL — ABNORMAL LOW (ref 12.0–15.0)
Immature Granulocytes: 0 %
Lymphocytes Relative: 29 %
Lymphs Abs: 1.4 K/uL (ref 0.7–4.0)
MCH: 25.9 pg — ABNORMAL LOW (ref 26.0–34.0)
MCHC: 32.2 g/dL (ref 30.0–36.0)
MCV: 80.5 fL (ref 80.0–100.0)
Monocytes Absolute: 0.4 K/uL (ref 0.1–1.0)
Monocytes Relative: 9 %
Neutro Abs: 2.8 K/uL (ref 1.7–7.7)
Neutrophils Relative %: 57 %
Platelet Count: 325 K/uL (ref 150–400)
RBC: 4.01 MIL/uL (ref 3.87–5.11)
RDW: 15.1 % (ref 11.5–15.5)
WBC Count: 4.9 K/uL (ref 4.0–10.5)
nRBC: 0 % (ref 0.0–0.2)

## 2023-10-17 LAB — TSH: TSH: <0.1 u[IU]/mL — ABNORMAL LOW (ref 0.350–4.500)

## 2023-10-17 LAB — CMP (CANCER CENTER ONLY)
ALT: 20 U/L (ref 0–44)
AST: 40 U/L (ref 15–41)
Albumin: 3.7 g/dL (ref 3.5–5.0)
Alkaline Phosphatase: 59 U/L (ref 38–126)
Anion gap: 11 (ref 5–15)
BUN: 26 mg/dL — ABNORMAL HIGH (ref 8–23)
CO2: 27 mmol/L (ref 22–32)
Calcium: 9.2 mg/dL (ref 8.9–10.3)
Chloride: 100 mmol/L (ref 98–111)
Creatinine: 1.29 mg/dL — ABNORMAL HIGH (ref 0.44–1.00)
GFR, Estimated: 42 mL/min — ABNORMAL LOW (ref >60)
Glucose, Bld: 116 mg/dL — ABNORMAL HIGH (ref 70–99)
Potassium: 4.2 mmol/L (ref 3.5–5.1)
Sodium: 137 mmol/L (ref 135–145)
Total Bilirubin: 0.3 mg/dL (ref 0.0–1.2)
Total Protein: 6.7 g/dL (ref 6.5–8.1)

## 2023-10-17 LAB — IRON AND TIBC
Iron: 39 ug/dL (ref 28–170)
Saturation Ratios: 10 % — ABNORMAL LOW (ref 10.4–31.8)
TIBC: 391 ug/dL (ref 250–450)
UIBC: 351 ug/dL

## 2023-10-17 LAB — FERRITIN: Ferritin: 70 ng/mL (ref 11–307)

## 2023-10-18 ENCOUNTER — Other Ambulatory Visit: Payer: Self-pay

## 2023-10-18 ENCOUNTER — Telehealth: Payer: Self-pay | Admitting: Oncology

## 2023-10-18 DIAGNOSIS — D649 Anemia, unspecified: Secondary | ICD-10-CM

## 2023-10-18 LAB — TSH: TSH: <0.1 u[IU]/mL — ABNORMAL LOW (ref 0.350–4.500)

## 2023-10-18 LAB — T4, FREE: Free T4: 2.31 ng/dL — ABNORMAL HIGH (ref 0.61–1.12)

## 2023-10-18 NOTE — Telephone Encounter (Signed)
 Patient has been scheduled for follow-up visit per 10/17/23 LOS.  Pt noted appt details on personal planner/calendar.

## 2023-10-18 NOTE — Telephone Encounter (Signed)
 Patient has been scheduled for follow-up visit per 10/18/23 LOS.  Pt noted appt details on personal planner/calendar.

## 2023-10-19 ENCOUNTER — Telehealth: Payer: Self-pay | Admitting: Pediatrics

## 2023-10-19 NOTE — Telephone Encounter (Signed)
 I was contacted on-call by Ms. Lancon reporting diarrhea and difficulty tolerating bowel prep for colonoscopy tomorrow.  She states that she began developing diarrhea earlier today even before starting her bowel prep.  She thinks that she may have had food poisoning or contracted gastroenteritis.  She did consume 1 bottle of Suprep this evening after she had significant nausea and vomiting.  States that she does not think she can continue doing bowel preparation.   Discussed options of providing a prescription for an antiemetic such as Zofran  or switching to a MiraLAX prep to complete bowel cleanout for tomorrow.  She declined both of these options and states that she is worried that she was having diarrhea even before starting the bowel prep and does not think she should proceed with the procedure tomorrow.  Advised that I would notify Dr. Charlanne and the nursing staff so that her procedure can be rescheduled to a future date.  Since she had some difficulty tolerating Suprep counseled her that we can see if she can have a different type of bowel preparation or a prescription for Zofran  to take prior to future bowel preparation.

## 2023-10-19 NOTE — Anesthesia Preprocedure Evaluation (Signed)
 Anesthesia Evaluation    Reviewed: Allergy & Precautions, Patient's Chart, lab work & pertinent test results, reviewed documented beta blocker date and time   History of Anesthesia Complications Negative for: history of anesthetic complications  Airway        Dental   Pulmonary neg pulmonary ROS          Cardiovascular hypertension, Pt. on medications and Pt. on home beta blockers      Neuro/Psych   Anxiety Depression    negative neurological ROS     GI/Hepatic Neg liver ROS,GERD  Medicated,,  Endo/Other  diabetes, Type 2 Hyperthyroidism   Renal/GU Renal InsufficiencyRenal disease (Cr 1.29)     Musculoskeletal  (+) Arthritis ,    Abdominal   Peds  Hematology  (+) Blood dyscrasia (Hgb 10.4), anemia   Anesthesia Other Findings   Reproductive/Obstetrics                              Anesthesia Physical Anesthesia Plan  ASA: 3  Anesthesia Plan: MAC   Post-op Pain Management: Minimal or no pain anticipated   Induction:   PONV Risk Score and Plan: Treatment may vary due to age or medical condition and Propofol  infusion  Airway Management Planned: Natural Airway and Nasal Cannula  Additional Equipment: None  Intra-op Plan:   Post-operative Plan:   Informed Consent:   Plan Discussed with:   Anesthesia Plan Comments: (Will discuss urgency of procedure with GI physician as patient is not medically optimized with untreated thyrotoxicosis. Lawence, MD)        Anesthesia Quick Evaluation

## 2023-10-20 ENCOUNTER — Ambulatory Visit (HOSPITAL_COMMUNITY): Admission: RE | Admit: 2023-10-20 | Source: Home / Self Care | Admitting: Gastroenterology

## 2023-10-20 ENCOUNTER — Encounter (HOSPITAL_COMMUNITY): Payer: Self-pay | Admitting: Anesthesiology

## 2023-10-20 SURGERY — COLONOSCOPY
Anesthesia: Monitor Anesthesia Care

## 2023-10-20 NOTE — Telephone Encounter (Signed)
 Called patient for a symptom update. She's not having any further diarrhea or nausea. She's going to try & eat a little food this morning if tolerated. Feels she may have had the stomach bug prior to starting prep. She would like to reschedule endo colon. Advised her dates are full currently, but will be in touch once Dr. Ira next schedule comes out or if a cancellation becomes available. Pt verbalized all understanding.

## 2023-10-24 ENCOUNTER — Inpatient Hospital Stay

## 2023-10-24 VITALS — BP 167/99 | HR 69 | Temp 97.5°F | Resp 16

## 2023-10-24 DIAGNOSIS — D509 Iron deficiency anemia, unspecified: Secondary | ICD-10-CM | POA: Diagnosis not present

## 2023-10-24 MED ORDER — IRON SUCROSE 20 MG/ML IV SOLN
200.0000 mg | Freq: Once | INTRAVENOUS | Status: AC
  Administered 2023-10-24: 200 mg via INTRAVENOUS
  Filled 2023-10-24: qty 10

## 2023-10-24 NOTE — Patient Instructions (Signed)

## 2023-10-26 ENCOUNTER — Inpatient Hospital Stay

## 2023-10-26 VITALS — BP 157/68 | HR 75 | Temp 98.0°F | Resp 18

## 2023-10-26 DIAGNOSIS — D509 Iron deficiency anemia, unspecified: Secondary | ICD-10-CM | POA: Diagnosis not present

## 2023-10-26 MED ORDER — SODIUM CHLORIDE 0.9% FLUSH
10.0000 mL | Freq: Once | INTRAVENOUS | Status: AC | PRN
  Administered 2023-10-26: 10 mL

## 2023-10-26 MED ORDER — IRON SUCROSE 20 MG/ML IV SOLN
200.0000 mg | Freq: Once | INTRAVENOUS | Status: AC
  Administered 2023-10-26: 200 mg via INTRAVENOUS
  Filled 2023-10-26: qty 10

## 2023-10-26 NOTE — Patient Instructions (Signed)

## 2023-10-28 ENCOUNTER — Inpatient Hospital Stay

## 2023-10-28 VITALS — BP 152/59 | HR 81 | Temp 97.7°F | Resp 18

## 2023-10-28 DIAGNOSIS — D509 Iron deficiency anemia, unspecified: Secondary | ICD-10-CM | POA: Diagnosis not present

## 2023-10-28 MED ORDER — IRON SUCROSE 20 MG/ML IV SOLN
200.0000 mg | Freq: Once | INTRAVENOUS | Status: AC
  Administered 2023-10-28: 200 mg via INTRAVENOUS
  Filled 2023-10-28: qty 10

## 2023-10-28 NOTE — Patient Instructions (Signed)

## 2023-10-31 ENCOUNTER — Inpatient Hospital Stay

## 2023-10-31 VITALS — BP 159/67 | HR 75 | Temp 98.0°F | Resp 18

## 2023-10-31 DIAGNOSIS — D509 Iron deficiency anemia, unspecified: Secondary | ICD-10-CM

## 2023-10-31 MED ORDER — IRON SUCROSE 20 MG/ML IV SOLN
200.0000 mg | Freq: Once | INTRAVENOUS | Status: AC
  Administered 2023-10-31: 200 mg via INTRAVENOUS
  Filled 2023-10-31: qty 10

## 2023-10-31 MED ORDER — SODIUM CHLORIDE 0.9% FLUSH: 10.0000 mL | Freq: Once | INTRAVENOUS | Status: DC | PRN

## 2023-10-31 NOTE — Patient Instructions (Signed)

## 2023-11-02 ENCOUNTER — Inpatient Hospital Stay

## 2023-11-02 VITALS — BP 163/69 | HR 76 | Temp 98.1°F | Resp 18

## 2023-11-02 DIAGNOSIS — D509 Iron deficiency anemia, unspecified: Secondary | ICD-10-CM | POA: Diagnosis not present

## 2023-11-02 MED ORDER — IRON SUCROSE 20 MG/ML IV SOLN
200.0000 mg | Freq: Once | INTRAVENOUS | Status: AC
  Administered 2023-11-02: 200 mg via INTRAVENOUS
  Filled 2023-11-02: qty 10

## 2023-11-02 MED ORDER — SODIUM CHLORIDE 0.9 % IV SOLN: INTRAVENOUS | Status: DC

## 2023-11-02 NOTE — Patient Instructions (Signed)

## 2023-11-08 DIAGNOSIS — M159 Polyosteoarthritis, unspecified: Secondary | ICD-10-CM | POA: Diagnosis not present

## 2023-11-08 DIAGNOSIS — M7989 Other specified soft tissue disorders: Secondary | ICD-10-CM | POA: Diagnosis not present

## 2023-11-08 DIAGNOSIS — E559 Vitamin D deficiency, unspecified: Secondary | ICD-10-CM | POA: Diagnosis not present

## 2023-11-08 DIAGNOSIS — R634 Abnormal weight loss: Secondary | ICD-10-CM | POA: Diagnosis not present

## 2023-11-08 DIAGNOSIS — E0590 Thyrotoxicosis, unspecified without thyrotoxic crisis or storm: Secondary | ICD-10-CM | POA: Diagnosis not present

## 2023-11-08 DIAGNOSIS — E663 Overweight: Secondary | ICD-10-CM | POA: Diagnosis not present

## 2023-11-08 DIAGNOSIS — E538 Deficiency of other specified B group vitamins: Secondary | ICD-10-CM | POA: Diagnosis not present

## 2023-11-08 DIAGNOSIS — J309 Allergic rhinitis, unspecified: Secondary | ICD-10-CM | POA: Diagnosis not present

## 2023-11-08 DIAGNOSIS — Z6829 Body mass index (BMI) 29.0-29.9, adult: Secondary | ICD-10-CM | POA: Diagnosis not present

## 2023-11-15 NOTE — Telephone Encounter (Signed)
 Called patient to schedule hospital date. Unable to reach patient, VM not an option. Will try again at a later time.

## 2023-11-17 ENCOUNTER — Other Ambulatory Visit: Payer: Self-pay

## 2023-11-17 DIAGNOSIS — H353231 Exudative age-related macular degeneration, bilateral, with active choroidal neovascularization: Secondary | ICD-10-CM | POA: Diagnosis not present

## 2023-11-17 DIAGNOSIS — K259 Gastric ulcer, unspecified as acute or chronic, without hemorrhage or perforation: Secondary | ICD-10-CM

## 2023-11-17 DIAGNOSIS — K219 Gastro-esophageal reflux disease without esophagitis: Secondary | ICD-10-CM

## 2023-11-17 DIAGNOSIS — D126 Benign neoplasm of colon, unspecified: Secondary | ICD-10-CM

## 2023-11-17 DIAGNOSIS — D509 Iron deficiency anemia, unspecified: Secondary | ICD-10-CM

## 2023-11-17 MED ORDER — ONDANSETRON HCL 4 MG PO TABS: ORAL_TABLET | ORAL | 0 refills | Status: DC

## 2023-11-17 NOTE — Telephone Encounter (Signed)
 Discussed PA recommendations with patient. She had no further questions.

## 2023-11-17 NOTE — Telephone Encounter (Signed)
 Left message for patient to call back. Zofran  prescription sent to pharmacy & instructions mailed home/mychart.

## 2023-11-17 NOTE — Telephone Encounter (Signed)
 Scheduled endo colon with patient for 01/26/24 at 8:30 AM at [facility] with Dr. Charlanne. Amb ref & hospital orders placed. Denies blood thinners/DM/GLP1 medications. She would like to do Miralax prep this time with zofran  as needed if possible. Will discuss with provider & call her back.

## 2023-11-18 ENCOUNTER — Encounter: Payer: Self-pay | Admitting: Oncology

## 2023-11-18 ENCOUNTER — Other Ambulatory Visit (HOSPITAL_COMMUNITY): Payer: Self-pay

## 2023-11-18 ENCOUNTER — Telehealth: Payer: Self-pay

## 2023-11-18 MED ORDER — ONDANSETRON HCL 4 MG PO TABS: ORAL_TABLET | ORAL | 0 refills | Status: DC

## 2023-11-18 NOTE — Telephone Encounter (Signed)
 [facility] for Zofran  4 mg tablets is 14 dollars for 30 tablets or can do 8 mg and break in half, 30 pill 16 dollars.  Can do that or try for PA.

## 2023-11-18 NOTE — Telephone Encounter (Signed)
 Called and spoke with patient. Patient states that a 2 day supply should be sufficient since she is using prior to prep. New Rx sent to pharmacy on file.

## 2023-11-18 NOTE — Telephone Encounter (Signed)
 Pharmacy Patient Advocate Encounter   Received notification from CoverMyMeds that prior authorization for Ondansetron  HCl 4MG  Tablets is required/requested.   Insurance verification completed.   The patient is insured through [facility] ADVANTAGE/RX ADVANCE.   Per test claim: Insurance pays MAX 2 day supply. Quantity 8 tablets per 2 days is $0.00

## 2023-11-18 NOTE — Addendum Note (Signed)
 Addended by: Brenly Trawick N on: 11/18/2023 02:46 PM   Modules accepted: Orders

## 2023-11-28 DIAGNOSIS — M19012 Primary osteoarthritis, left shoulder: Secondary | ICD-10-CM | POA: Diagnosis not present

## 2023-12-07 ENCOUNTER — Telehealth: Payer: Self-pay | Admitting: Physician Assistant

## 2023-12-07 DIAGNOSIS — K5900 Constipation, unspecified: Secondary | ICD-10-CM

## 2023-12-07 DIAGNOSIS — R1084 Generalized abdominal pain: Secondary | ICD-10-CM

## 2023-12-07 DIAGNOSIS — R197 Diarrhea, unspecified: Secondary | ICD-10-CM

## 2023-12-07 NOTE — Telephone Encounter (Signed)
 Inbound call from patient stating that she was wanting to let Alan know that she has had diarrhea for about 1 month and the Pepto Bismol has not help her at all. Patient is now complaining of severe abdominal pain and cramping. Patient is requesting a call back to advise her on what she needs to do. Please advise.

## 2023-12-07 NOTE — Telephone Encounter (Signed)
 Patient is scheduled for EGD/Colon on 01/26/24. Do you want her to be seen in the office prior? I don't see that she has any anti-spasmodics

## 2023-12-08 ENCOUNTER — Ambulatory Visit (INDEPENDENT_AMBULATORY_CARE_PROVIDER_SITE_OTHER)
Admission: RE | Admit: 2023-12-08 | Discharge: 2023-12-08 | Disposition: A | Source: Ambulatory Visit | Attending: Physician Assistant | Admitting: Physician Assistant

## 2023-12-08 ENCOUNTER — Ambulatory Visit: Payer: Self-pay | Admitting: Physician Assistant

## 2023-12-08 DIAGNOSIS — R197 Diarrhea, unspecified: Secondary | ICD-10-CM

## 2023-12-08 DIAGNOSIS — R109 Unspecified abdominal pain: Secondary | ICD-10-CM | POA: Diagnosis not present

## 2023-12-08 DIAGNOSIS — K5900 Constipation, unspecified: Secondary | ICD-10-CM

## 2023-12-08 DIAGNOSIS — R1084 Generalized abdominal pain: Secondary | ICD-10-CM

## 2023-12-08 MED ORDER — HYOSCYAMINE SULFATE 0.125 MG PO TABS: 0.1250 mg | ORAL_TABLET | Freq: Four times a day (QID) | ORAL | 0 refills | Status: DC | PRN

## 2023-12-08 NOTE — Addendum Note (Signed)
 Addended by: Kourtney Montesinos N on: 12/08/2023 10:47 AM   Modules accepted: Orders

## 2023-12-08 NOTE — Telephone Encounter (Signed)
 Called and spoke with patient regarding [name]'s recommendations. Patient will have x-ray completed at [facility] and labs drawn at [facility] in [place]. Patient has been advised to follow BRAT diet and hydrate well. Patient is aware that we will send in a prescription that she can take PRN for abdominal pain. Patient is aware that further recommendations to follow depending on results. I reviewed ER precautions with patient. Patient verbalized understanding and had no concern at the end of the call.

## 2023-12-08 NOTE — Telephone Encounter (Signed)
 Called and informed patient that Levsin is not on her formulary and it will be $10 for a 30 day supply with [facility] at [employer] [facility] [employer]. Patient would like for us  to send prescription to [facility]. No concerns at the end of the call.   [facility] coupon information included on prescription.

## 2023-12-09 LAB — CBC
Hematocrit: 36.9 % (ref 34.0–46.6)
Hemoglobin: 11.6 g/dL (ref 11.1–15.9)
MCH: 27.4 pg (ref 26.6–33.0)
MCHC: 31.4 g/dL — ABNORMAL LOW (ref 31.5–35.7)
MCV: 87 fL (ref 79–97)
Platelets: 308 x10E3/uL (ref 150–450)
RBC: 4.24 x10E6/uL (ref 3.77–5.28)
RDW: 15.4 % (ref 11.7–15.4)
WBC: 5.1 x10E3/uL (ref 3.4–10.8)

## 2023-12-09 LAB — COMPREHENSIVE METABOLIC PANEL WITH GFR
ALT: 28 IU/L (ref 0–32)
AST: 33 IU/L (ref 0–40)
Albumin: 3.9 g/dL (ref 3.8–4.8)
Alkaline Phosphatase: 64 IU/L (ref 49–135)
BUN/Creatinine Ratio: 22 (ref 12–28)
BUN: 22 mg/dL (ref 8–27)
Bilirubin Total: 0.5 mg/dL (ref 0.0–1.2)
CO2: 23 mmol/L (ref 20–29)
Calcium: 8.8 mg/dL (ref 8.7–10.3)
Chloride: 102 mmol/L (ref 96–106)
Creatinine, Ser: 1.02 mg/dL — ABNORMAL HIGH (ref 0.57–1.00)
Globulin, Total: 2.4 g/dL (ref 1.5–4.5)
Glucose: 97 mg/dL (ref 70–99)
Potassium: 4 mmol/L (ref 3.5–5.2)
Sodium: 140 mmol/L (ref 134–144)
Total Protein: 6.3 g/dL (ref 6.0–8.5)
eGFR: 56 mL/min/1.73 — ABNORMAL LOW (ref >59)

## 2023-12-09 LAB — SEDIMENTATION RATE: Sed Rate: 17 mm/h (ref 0–40)

## 2023-12-20 ENCOUNTER — Other Ambulatory Visit: Payer: Self-pay

## 2023-12-20 NOTE — Progress Notes (Unsigned)
 Cardiology Office Note:    Date:  12/21/2023   ID:  [name], DOB 1945/09/29, MRN [id number]  PCP:  Gable Cambric, MD  Cardiologist:  Redell Leiter, MD    Referring MD: Gable Cambric, MD    ASSESSMENT:    1. Hyperthyroidism   2. Essential hypertension   3. Mixed hyperlipidemia   4. SVT (supraventricular tachycardia)   5. Chronic anticoagulation    PLAN:    In order of problems listed above:  New problem she is hyperthyroid she does not have a endocrine consultation until the end of January because of the cardiovascular ramifications I am not initiate her on PTU therapy and send a note to the endocrine specialist to see if she can be brought in sooner than later also continue her beta-blocker I am uncertain if her blood pressure is controlled on a single reading I asked her to get back in the habit of recording her blood pressure daily good technique validated advised to bring into follow-up visits in the future and for now continue her beta-blocker and ARB She will continue her current statin lipid profile August LDL 66 cholesterol 137 Stable fortunately no recurrence with her thyroid  excess She is no longer anticoagulated she had major bleeding complications evaluated by hematology not felt to require ongoing therapy   Next appointment: I will plan to see her in my office in 1 year   Medication Adjustments/Labs and Tests Ordered: Current medicines are reviewed at length with the patient today.  Concerns regarding medicines are outlined above.  Orders Placed This Encounter  Procedures   TSH+T4F+T3Free   EKG 12-Lead   Meds ordered this encounter  Medications   propylthiouracil  (PTU) 50 MG tablet    Sig: Take 2 tablets (100 mg total) by mouth 3 (three) times daily.    Dispense:  180 tablet    Refill:  0     History of Present Illness:    [name] is a 78 y.o. female with a hx of atrial arrhythmia with symptomatic brief episodes of SVT hypertension type 2  diabetes hyperlipidemia chronic anticoagulation for deep vein and pulmonary thromboembolism and costochondral chest pain syndrome last seen 11/02/2022.  She was evaluated by hematology her anticoagulant was withdrawn and she takes aspirin for cardiovascular prophylaxis.  Recent labs 2 weeks ago shows her to have a hemoglobin 11.6 improved from previous 9.5 creatinine 1.02 GFR 56 cc/min Compliance with diet, lifestyle and medications: Yes  In short she feels terribly she is weak she has lost 40 to 50 pounds and she is tremulous. She is hyperthyroid TSH is severely suppressed and free T4 is elevated. She is awaiting endocrinology evaluation certainly be at risk for developing complications including atrial arrhythmia previous SVT heart failure and even a hypercoagulable state. I am going to start her on PTU and draw free T3 T4 and a TSH today and sent a note to the endocrinologist to see her in consultation scheduled at the end of January Fortunately not having edema shortness of breath chest pain palpitation or syncope She is hypertensive is not checking her blood pressure at home I asked her to get the habit of recording it and bring it to doctors visits not altering her antihypertensive medications and fortunately she has not taken a beta-blocker She is not on anticoagulant therapy Past Medical History:  Diagnosis Date   Abnormal gait 03/15/2023   Acquired trigger finger of right ring finger 11/24/2020   Anxiety    Arthritis  Arthritis of left glenohumeral joint 12/04/2023   Chronic cough    Sinus CT ned 03-19-10; allergy eval neg 03-19-2010>>>IgE 38 pos only for grass; Singulair tiral 03-19-2010   Closed fracture of nasal bones 01/07/2021   Depression    Diabetes mellitus without complication (HCC)    Disorder of bone and cartilage 03/19/2010   Qualifier: Diagnosis of   By: Darlean MD, Ozell NOVAK     IMO SNOMED Dx Update Oct 2024     Disorder of nervous system due to type 2 diabetes mellitus  (HCC) 03/15/2023   DVT (deep venous thrombosis) (HCC)    Dyslipidemia 10/05/2016   Enlarged lymph nodes 03/02/2010   Followed in Pulmonary clinic/ [facility]/ [facility]   Hilar adenopathy by CT 02/24/2010  > ESR 22 03/19/10 > cxr nl 05/18/2010      IMO SNOMED Dx Update Oct 2024     Generalized muscle weakness 03/08/2023   GERD (gastroesophageal reflux disease)    Hallux valgus, acquired 06/09/2015   Hemorrhagic disorder due to circulating anticoagulants 03/15/2023   Hilar adenopathy    CT 02-24-2010   Hyperlipidemia    Hypertension    Hyperthyroidism 10/17/2023   Iron  deficiency anemia 12/30/2022   Left hip pain 11/28/2023   Mild recurrent major depression 03/15/2023   Need for assistance with personal care 03/15/2023   Obesity 10/05/2016   Other and unspecified hyperlipidemia    Other pulmonary embolism and infarction    Pneumonia, organism unspecified(486)    Pulmonary embolism (HCC)    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   Right foot pain 06/09/2015   SOB (shortness of breath) 10/05/2016   Trigger little finger of right hand 07/15/2020   Unspecified asthma(493.90)    Vitamin B12 deficiency     Current Medications: Active Medications[1]    EKGs/Labs/Other Studies Reviewed:    The following studies were reviewed today:  EKG Interpretation Date/Time:  Wednesday December 21 2023 11:27:09 EST Ventricular Rate:  77 PR Interval:  140 QRS Duration:  82 QT Interval:  354 QTC Calculation: 400 R Axis:   46  Text Interpretation: Normal sinus rhythm Normal ECG When compared with ECG of 23-Dec-2020 11:02, No significant change was found Confirmed by Monetta Rogue ([id number]) on 12/21/2023 11:30:17 AM   Recent Labs: 10/17/2023: TSH <0.100 12/08/2023: ALT 28; BUN 22; Creatinine, Ser 1.02; Hemoglobin 11.6; Platelets 308; Potassium 4.0; Sodium 140  Recent Lipid Panel No results found for: CHOL, TRIG, HDL, CHOLHDL, VLDL, LDLCALC, LDLDIRECT  Physical Exam:    VS:  BP (!)  170/76   Pulse 77   Ht 5' 5 (1.651 m)   Wt 159 lb 12.8 oz (72.5 kg)   SpO2 96%   BMI 26.59 kg/m     Wt Readings from Last 3 Encounters:  12/21/23 159 lb 12.8 oz (72.5 kg)  10/17/23 168 lb (76.2 kg)  07/27/23 179 lb 12.8 oz (81.6 kg)     GEN: Tremulous thyroid  appears enlarged no bruit well nourished, well developed in no acute distress HEENT: Normal NECK: No JVD; No carotid bruits LYMPHATICS: No lymphadenopathy CARDIAC: RRR, no murmurs, rubs, gallops RESPIRATORY:  Clear to auscultation without rales, wheezing or rhonchi  ABDOMEN: Soft, non-tender, non-distended MUSCULOSKELETAL:  No edema; No deformity  SKIN: Warm and dry NEUROLOGIC:  Alert and oriented x 3 PSYCHIATRIC:  Normal affect    Signed, Rogue Monetta, MD  12/21/2023 11:57 AM    [facility]      [1]  Current Meds  Medication Sig  albuterol  (VENTOLIN  HFA) 108 (90 Base) MCG/ACT inhaler Inhale into the lungs every 6 (six) hours as needed for wheezing or shortness of breath.   aspirin EC 81 MG tablet Take 81 mg by mouth daily. Swallow whole.   carvedilol (COREG) 3.125 MG tablet Take 3.125 mg by mouth 2 (two) times daily with a meal.   Cetirizine HCl (ZYRTEC PO) Take 1 tablet by mouth daily.   Choline Fenofibrate (FENOFIBRIC ACID) 135 MG CPDR Take by mouth.   citalopram (CELEXA) 20 MG tablet Take 20 mg by mouth daily.   cyanocobalamin  (VITAMIN B12) 1000 MCG tablet Take 1,000 mcg by mouth every 30 (thirty) days.   ergocalciferol (VITAMIN D2) 1.25 MG (50000 UT) capsule Take 50,000 Units by mouth once a week.   fenofibrate micronized (LOFIBRA) 134 MG capsule Take 134 mg by mouth daily before breakfast.   fluticasone (FLONASE) 50 MCG/ACT nasal spray Place 2 sprays into both nostrils daily.   furosemide (LASIX) 40 MG tablet Take 40 mg by mouth 2 (two) times daily.   hyoscyamine  (LEVSIN ) 0.125 MG tablet Take 1 tablet (0.125 mg total) by mouth every 6 (six) hours as needed for cramping.   losartan  (COZAAR) 50 MG tablet Take 50 mg by mouth daily.   ondansetron  (ZOFRAN ) 4 MG tablet Take 4 mg (1 tablet) by mouth every 6-8 hours as needed. Take 1 tablet 30 minutes prior to starting prep.   ONETOUCH ULTRA TEST test strip 1 each daily.   pantoprazole  (PROTONIX ) 40 MG tablet Take 1 tablet (40 mg total) by mouth daily.   Potassium Chloride CR (MICRO-K) 8 MEQ CPCR capsule CR Take 8 mEq by mouth as needed (leg cramps).   propylthiouracil  (PTU) 50 MG tablet Take 2 tablets (100 mg total) by mouth 3 (three) times daily.   rosuvastatin (CRESTOR) 5 MG tablet Take 2.5 mg by mouth 2 (two) times a week.   traMADol  (ULTRAM ) 50 MG tablet Take 1 tablet (50 mg total) by mouth every 6 (six) hours as needed.

## 2023-12-21 ENCOUNTER — Encounter: Payer: Self-pay | Admitting: Cardiology

## 2023-12-21 ENCOUNTER — Ambulatory Visit: Attending: Cardiology | Admitting: Cardiology

## 2023-12-21 VITALS — BP 150/70 | HR 77 | Ht 65.0 in | Wt 159.8 lb

## 2023-12-21 DIAGNOSIS — E782 Mixed hyperlipidemia: Secondary | ICD-10-CM | POA: Diagnosis not present

## 2023-12-21 DIAGNOSIS — I10 Essential (primary) hypertension: Secondary | ICD-10-CM

## 2023-12-21 DIAGNOSIS — Z7901 Long term (current) use of anticoagulants: Secondary | ICD-10-CM | POA: Diagnosis not present

## 2023-12-21 DIAGNOSIS — I471 Supraventricular tachycardia, unspecified: Secondary | ICD-10-CM | POA: Diagnosis not present

## 2023-12-21 DIAGNOSIS — E0590 Thyrotoxicosis, unspecified without thyrotoxic crisis or storm: Secondary | ICD-10-CM

## 2023-12-21 MED ORDER — PROPYLTHIOURACIL 50 MG PO TABS: 100.0000 mg | ORAL_TABLET | Freq: Three times a day (TID) | ORAL | 0 refills | Status: DC

## 2023-12-21 NOTE — Patient Instructions (Signed)
 Medication Instructions:  Your physician has recommended you make the following change in your medication:   START: PTU 100 mg three times daily  *If you need a refill on your cardiac medications before your next appointment, please call your pharmacy*  Lab Work: Your physician recommends that you return for lab work in:   Labs today: TSH T3 T4  If you have labs (blood work) drawn today and your tests are completely normal, you will receive your results only by: MyChart Message (if you have MyChart) OR A paper copy in the mail If you have any lab test that is abnormal or we need to change your treatment, we will call you to review the results.  Testing/Procedures: None  Follow-Up: At [facility], you and your health needs are our priority.  As part of our continuing mission to provide you with exceptional heart care, our providers are all part of one team.  This team includes your primary Cardiologist (physician) and Advanced Practice Providers or APPs (Physician Assistants and Nurse Practitioners) who all work together to provide you with the care you need, when you need it.  Your next appointment:   1 year(s)  Provider:   Redell Leiter, MD    We recommend signing up for the patient portal called MyChart.  Sign up information is provided on this After Visit Summary.  MyChart is used to connect with patients for Virtual Visits (Telemedicine).  Patients are able to view lab/test results, encounter notes, upcoming appointments, etc.  Non-urgent messages can be sent to your provider as well.   To learn more about what you can do with MyChart, go to [email or web address].   Other Instructions Please keep a BP log for 2 weeks and send by MyChart or mail.                      Dr. Leiter [address] [place], [place] [address]  Blood Pressure Record Sheet To take your blood pressure, you will need a blood pressure machine. You can buy a blood pressure machine (blood pressure  monitor) at your clinic, drug store, or online. When choosing one, consider: An automatic monitor that has an arm cuff. A cuff that wraps snugly around your upper arm. You should be able to fit only one finger between your arm and the cuff. A device that stores blood pressure reading results. Do not choose a monitor that measures your blood pressure from your wrist or finger. Follow your health care provider's instructions for how to take your blood pressure. To use this form: Get one reading in the morning (a.m.) 1-2 hours after you take any medicines. Get one reading in the evening (p.m.) before supper.   Blood pressure log Date: _______________________  a.m. _____________________(1st reading) HR___________            p.m. _____________________(2nd reading) HR__________  Date: _______________________  a.m. _____________________(1st reading) HR___________            p.m. _____________________(2nd reading) HR__________  Date: _______________________  a.m. _____________________(1st reading) HR___________            p.m. _____________________(2nd reading) HR__________  Date: _______________________  a.m. _____________________(1st reading) HR___________            p.m. _____________________(2nd reading) HR__________  Date: _______________________  a.m. _____________________(1st reading) HR___________            p.m. _____________________(2nd reading) HR__________  Date: _______________________  a.m. _____________________(1st reading) HR___________  p.m. _____________________(2nd reading) HR__________  Date: _______________________  a.m. _____________________(1st reading) HR___________            p.m. _____________________(2nd reading) HR__________   This information is not intended to replace advice given to you by your health care provider. Make sure you discuss any questions you have with your health care provider. Document Revised: 04/11/2019 Document  Reviewed: 04/11/2019 Elsevier Patient Education  2021 [employer].

## 2023-12-22 ENCOUNTER — Ambulatory Visit: Payer: Self-pay | Admitting: Cardiology

## 2023-12-22 LAB — TSH+T4F+T3FREE
Free T4: 2.44 ng/dL — ABNORMAL HIGH (ref 0.82–1.77)
T3, Free: 4.5 pg/mL — ABNORMAL HIGH (ref 2.0–4.4)
TSH: <0.005 u[IU]/mL — ABNORMAL LOW (ref 0.450–4.500)

## 2023-12-22 NOTE — Progress Notes (Unsigned)
 [facility] at [facility] [address] [place],  [place]  [address] [phone]  Clinic Day: 10/17/2023  Referring physician: Gable Cambric, MD   HISTORY OF PRESENT ILLNESS:  The patient is a 78 y.o. female with anemia that appears to be multifactorial, including being due to iron  deficiency and chronic renal insufficiency.  She comes in today to reassess her labs.  Of note, she last received IV iron  in January/February 2025.  Overall, the patient has been doing okay.  However, she does complain of increased fatigue over these past few weeks.  She is very concerned her iron  levels are dropping to where more IV iron  needs to be considered.  She also brings to my attention that she is scheduled for both an EGD and colonoscopy before the end of the week.  Another concern she has is that she has had profound, unintentional weight loss over this calendar year.  This patient also had a history of bilateral pulmonary emboli and an extensive left lower extremity DVT which were thought to have developed due to underlying oral contraception/herbal-based hormonal therapy.  A hypercoagulable workup done in the past came back negative for any clotting disorders.  She was previously on Eliquis.  She currently takes aspirin to prevent additional clots in the future.  PHYSICAL EXAM:  There were no vitals taken for this visit. Wt Readings from Last 3 Encounters:  12/21/23 159 lb 12.8 oz (72.5 kg)  10/17/23 168 lb (76.2 kg)  07/27/23 179 lb 12.8 oz (81.6 kg)   There is no height or weight on file to calculate BMI. Performance status (ECOG): 1 - Symptomatic but completely ambulatory Physical Exam Constitutional:      Appearance: Normal appearance. She is not ill-appearing.  HENT:     Mouth/Throat:     Mouth: Mucous membranes are moist.     Pharynx: Oropharynx is clear. No oropharyngeal exudate or posterior oropharyngeal erythema.  Cardiovascular:     Rate and Rhythm: Normal rate and  regular rhythm.     Heart sounds: No murmur heard.    No friction rub. No gallop.  Pulmonary:     Effort: Pulmonary effort is normal. No respiratory distress.     Breath sounds: Normal breath sounds. No wheezing, rhonchi or rales.  Abdominal:     General: Bowel sounds are normal. There is no distension.     Palpations: Abdomen is soft. There is no mass.     Tenderness: There is no abdominal tenderness.  Musculoskeletal:        General: No swelling.     Right lower leg: No edema.     Left lower leg: 2+ Edema present.  Lymphadenopathy:     Cervical: No cervical adenopathy.     Upper Body:     Right upper body: No supraclavicular or axillary adenopathy.     Left upper body: No supraclavicular or axillary adenopathy.     Lower Body: No right inguinal adenopathy. No left inguinal adenopathy.  Skin:    General: Skin is warm.     Coloration: Skin is not jaundiced.     Findings: No lesion or rash.  Neurological:     General: No focal deficit present.     Mental Status: She is alert and oriented to person, place, and time. Mental status is at baseline.  Psychiatric:        Mood and Affect: Mood normal.        Behavior: Behavior normal.  Thought Content: Thought content normal.    LABS:      Latest Ref Rng & Units 12/08/2023    1:10 PM 10/17/2023    3:39 PM 08/15/2023    1:51 PM  CBC  WBC 3.4 - 10.8 x10E3/uL 5.1  4.9  5.0   Hemoglobin 11.1 - 15.9 g/dL 88.3  89.5  9.9   Hematocrit 34.0 - 46.6 % 36.9  32.3  31.0   Platelets 150 - 450 x10E3/uL 308  325  315       Latest Ref Rng & Units 12/08/2023    1:10 PM 10/17/2023    3:39 PM 06/14/2023    1:42 PM  CMP  Glucose 70 - 99 mg/dL 97  883  879   BUN 8 - 27 mg/dL 22  26  34   Creatinine 0.57 - 1.00 mg/dL 8.97  8.70  8.61   Sodium 134 - 144 mmol/L 140  137  140   Potassium 3.5 - 5.2 mmol/L 4.0  4.2  4.4   Chloride 96 - 106 mmol/L 102  100  103   CO2 20 - 29 mmol/L 23  27  26    Calcium 8.7 - 10.3 mg/dL 8.8  9.2  9.3   Total  Protein 6.0 - 8.5 g/dL 6.3  6.7  6.2   Total Bilirubin 0.0 - 1.2 mg/dL 0.5  0.3  0.4   Alkaline Phos 49 - 135 IU/L 64  59  49   AST 0 - 40 IU/L 33  40  29   ALT 0 - 32 IU/L 28  20  13      Latest Reference Range & Units 10/17/23 15:38  Iron  28 - 170 ug/dL 39  UIBC ug/dL 648  TIBC 749 - 549 ug/dL 608  Saturation Ratios 10.4 - 31.8 % 10 (L)  Ferritin 11 - 307 ng/mL 70  (L): Data is abnormally low  Latest Reference Range & Units 08/15/23 13:51 10/17/23 15:44  TSH 0.350 - 4.500 uIU/mL <0.010 (L) <0.100 (L)  (L): Data is abnormally low  ASSESSMENT & PLAN:  Assessment/Plan:  A 78 y.o. female with anemia secondary to iron  deficiency and chronic renal insufficiency.  I am pleased as her hemoglobin is above 10 today.  However, some of her iron  parameters are lower than what they have been in the past.  When combining this with her MCV progressively falling over these past few months, I will arrange for her to receive another course of IV iron  over these next few weeks to replenish her iron  stores and improve her hemoglobin.  However, I am also concerned that her weight loss may actually be due to her having hyperthyroidism/thyrotoxicosis.  A TSH checked in both August 2025 and today came back at undetectable levels, which suggests she has an extremely overactive thyroid .  I will also check a free T4 level today.  Furthermore, I will place a referral to endocrinology to have her thyroid  disease better evaluated.  I will see this patient back in 2 months for repeat clinical assessment. The patient understands all the plans discussed today and is in agreement with them.    ADDENDUM:    Latest Reference Range & Units 10/17/23 08:14 10/17/23 15:44  TSH 0.350 - 4.500 uIU/mL  <0.100 (L)  T4,Free(Direct) 0.61 - 1.12 ng/dL 7.68 (H)   (L): Data is abnormally low (H): Data is abnormally high  These lab results are clearly consistent with hyperthyroidism, for which she will be referred to endocrinology for  evaluation.    Jaquanna Ballentine DELENA Kerns, MD

## 2023-12-23 ENCOUNTER — Telehealth: Payer: Self-pay | Admitting: Oncology

## 2023-12-23 ENCOUNTER — Other Ambulatory Visit: Payer: Self-pay

## 2023-12-23 ENCOUNTER — Inpatient Hospital Stay: Admitting: Oncology

## 2023-12-23 ENCOUNTER — Other Ambulatory Visit: Payer: Self-pay | Admitting: Oncology

## 2023-12-23 ENCOUNTER — Inpatient Hospital Stay: Attending: Oncology

## 2023-12-23 ENCOUNTER — Inpatient Hospital Stay

## 2023-12-23 VITALS — BP 140/56 | HR 79 | Temp 98.3°F | Resp 20 | Ht 65.0 in | Wt 161.0 lb

## 2023-12-23 DIAGNOSIS — Z86718 Personal history of other venous thrombosis and embolism: Secondary | ICD-10-CM | POA: Insufficient documentation

## 2023-12-23 DIAGNOSIS — D508 Other iron deficiency anemias: Secondary | ICD-10-CM

## 2023-12-23 DIAGNOSIS — Z86711 Personal history of pulmonary embolism: Secondary | ICD-10-CM | POA: Insufficient documentation

## 2023-12-23 DIAGNOSIS — D649 Anemia, unspecified: Secondary | ICD-10-CM

## 2023-12-23 DIAGNOSIS — D631 Anemia in chronic kidney disease: Secondary | ICD-10-CM | POA: Diagnosis not present

## 2023-12-23 DIAGNOSIS — D509 Iron deficiency anemia, unspecified: Secondary | ICD-10-CM | POA: Insufficient documentation

## 2023-12-23 DIAGNOSIS — N189 Chronic kidney disease, unspecified: Secondary | ICD-10-CM | POA: Diagnosis not present

## 2023-12-23 DIAGNOSIS — E0590 Thyrotoxicosis, unspecified without thyrotoxic crisis or storm: Secondary | ICD-10-CM | POA: Diagnosis not present

## 2023-12-23 DIAGNOSIS — Z79899 Other long term (current) drug therapy: Secondary | ICD-10-CM | POA: Insufficient documentation

## 2023-12-23 LAB — CBC WITH DIFFERENTIAL (CANCER CENTER ONLY)
Abs Immature Granulocytes: 0.01 K/uL (ref 0.00–0.07)
Basophils Absolute: 0 K/uL (ref 0.0–0.1)
Basophils Relative: 1 %
Eosinophils Absolute: 0.2 K/uL (ref 0.0–0.5)
Eosinophils Relative: 4 %
HCT: 37.4 % (ref 36.0–46.0)
Hemoglobin: 11.9 g/dL — ABNORMAL LOW (ref 12.0–15.0)
Immature Granulocytes: 0 %
Lymphocytes Relative: 28 %
Lymphs Abs: 1.4 K/uL (ref 0.7–4.0)
MCH: 27.5 pg (ref 26.0–34.0)
MCHC: 31.8 g/dL (ref 30.0–36.0)
MCV: 86.4 fL (ref 80.0–100.0)
Monocytes Absolute: 0.4 K/uL (ref 0.1–1.0)
Monocytes Relative: 7 %
Neutro Abs: 2.9 K/uL (ref 1.7–7.7)
Neutrophils Relative %: 60 %
Platelet Count: 233 K/uL (ref 150–400)
RBC: 4.33 MIL/uL (ref 3.87–5.11)
RDW: 16.3 % — ABNORMAL HIGH (ref 11.5–15.5)
WBC Count: 4.9 K/uL (ref 4.0–10.5)
nRBC: 0 % (ref 0.0–0.2)

## 2023-12-23 LAB — T4, FREE: Free T4: 2.22 ng/dL — ABNORMAL HIGH (ref 0.80–2.00)

## 2023-12-23 LAB — TSH: TSH: <0.1 u[IU]/mL — ABNORMAL LOW (ref 0.350–4.500)

## 2023-12-23 NOTE — Telephone Encounter (Signed)
 Patient has been scheduled for follow-up visit per 12/23/2023 LOS.  Pt given an appt calendar with date and time.

## 2024-01-06 ENCOUNTER — Telehealth: Payer: Self-pay

## 2024-01-06 NOTE — Telephone Encounter (Signed)
 Pt called to get iron  results from last visit. I told pt that iron  panel was not drawn. She stated, Wonder why he didn't draw it? That's why I see him. I offered to schedule her a lab appt to come have iron  panel drawn, as she said she doesn't come back for 6 months. She wants me to ask Dr Ezzard why it wasn't done?

## 2024-01-11 ENCOUNTER — Encounter: Payer: Self-pay | Admitting: Oncology

## 2024-01-13 ENCOUNTER — Other Ambulatory Visit

## 2024-01-13 ENCOUNTER — Encounter: Payer: Self-pay | Admitting: Endocrinology

## 2024-01-13 ENCOUNTER — Ambulatory Visit (INDEPENDENT_AMBULATORY_CARE_PROVIDER_SITE_OTHER): Admitting: Endocrinology

## 2024-01-13 ENCOUNTER — Encounter: Payer: Self-pay | Admitting: Oncology

## 2024-01-13 ENCOUNTER — Encounter (HOSPITAL_COMMUNITY): Payer: Self-pay | Admitting: Gastroenterology

## 2024-01-13 VITALS — BP 138/60 | HR 77 | Resp 16 | Ht 65.0 in | Wt 163.4 lb

## 2024-01-13 DIAGNOSIS — E0590 Thyrotoxicosis, unspecified without thyrotoxic crisis or storm: Secondary | ICD-10-CM | POA: Diagnosis not present

## 2024-01-13 NOTE — Progress Notes (Signed)
 "  Outpatient Endocrinology Note [name], MD   Patient's Name: [name]    DOB: [birth year]    MRN: [id number]  REASON OF VISIT: New consult for hyperthyroidism  REFERRING PROVIDER: Ezzard Valaria LABOR, MD    PCP: Gable Cambric, MD  HISTORY OF PRESENT ILLNESS:   [name] is a 79 y.o. old female with past medical history as listed below is presented for new consult for hyperthyroidism.   Pertinent Thyroid  History: Patient is referred to endocrinology for evaluation and management of hyperthyroidism, initial consult on January 13, 2024.  Patient was diagnosed with hyperthyroidism in December 2025, when she was following with cardiology, started on propylthiouracil  50 mg 2 tablets 3 times a day on December 21, 2023.  She has been taking carvedilol for known history of SVT.  Patient has a history of pulmonary embolism / DVT, used to be on anticoagulation Eliquis, reports was stopped around March 2025.  Patient reports she has also been not taking aspirin, was stopped few months ago.  Patient had complaints of significant weight loss starting from summer of 2025, she had complaints of occasional palpitations and heat intolerance.  Patient has daughter with hypothyroidism.  She has noticed occasional small lump on the upper lateral neck and gets occasional sore throat otherwise she has no thyromegaly or goiter.  Does not recall cold or flulike symptoms in the last few months.  No redness and watering of the eyes.  She had CT scan abdomen and pelvis in March 2025 and has no other IV iodine contrast use in 2025.  With a chart review she had normal thyroid  function test with TSH of normal 0.746 in April 2025.  She is noted to have low TSH starting from June 2025, she had persistently low TSH, multiple times including on December 21, 2023 she had undetectably low TSH with mildly elevated free T3 of 4.5 and elevated free T4 of 2.44.  No thyroid  imaging available to review.  No thyroid   autoantibodies available to review.  She has not been on amiodarone.  She denies taking over-the-counter supplement/biotin.  Lab:   Latest Reference Range & Units 10/17/23 15:44 12/21/23 14:20 12/23/23 12:54  TSH 0.350 - 4.500 uIU/mL <0.100 (L) <0.005 (L) <0.100 (L)  Triiodothyronine,Free,Serum 2.0 - 4.4 pg/mL  4.5 (H)   T4,Free(Direct) 0.80 - 2.00 ng/dL  7.55 (H) 7.77 (H)  (L): Data is abnormally low (H): Data is abnormally high  Interval history : Initial visit today.  Patient was diagnosed with hyperthyroidism in December 2025.  She has persistently low TSH starting June 2025 consistent with having hyperthyroidism.  Patient was started on propylthiouracil  100 mg 3 times a day in the middle of December 2025.  Patient has been feeling better after being on propylthiouracil .  She has noticed increased bruises in the extremities and abdomen in last few weeks after being on propylthiouracil .  She denies nausea, vomiting or abdominal pain.  No recent fever or illness.  Patient had normal liver enzymes and blood count WBC and neutrophil in December.  Patient is accompanied by a friend in the clinic today.  REVIEW OF SYSTEMS:  As per history of present illness.   PAST MEDICAL HISTORY: Past Medical History:  Diagnosis Date   Abnormal gait 03/15/2023   Acquired trigger finger of right ring finger 11/24/2020   Anxiety    Arthritis    Arthritis of left glenohumeral joint 12/04/2023   Chronic cough    Sinus CT ned 03-19-10; allergy eval neg 03-19-2010>>>IgE  38 pos only for grass; Singulair tiral 03-19-2010   Closed fracture of nasal bones 01/07/2021   Depression    Diabetes mellitus without complication (HCC)    Disorder of bone and cartilage 03/19/2010   Qualifier: Diagnosis of   By: Darlean MD, Ozell NOVAK     IMO SNOMED Dx Update Oct 2024     Disorder of nervous system due to type 2 diabetes mellitus (HCC) 03/15/2023   DVT (deep venous thrombosis) (HCC)    Dyslipidemia 10/05/2016   Enlarged  lymph nodes 03/02/2010   Followed in Pulmonary clinic/ [facility]/ [facility]   Hilar adenopathy by CT 02/24/2010  > ESR 22 03/19/10 > cxr nl 05/18/2010      IMO SNOMED Dx Update Oct 2024     Generalized muscle weakness 03/08/2023   GERD (gastroesophageal reflux disease)    Hallux valgus, acquired 06/09/2015   Hemorrhagic disorder due to circulating anticoagulants 03/15/2023   Hilar adenopathy    CT 02-24-2010   Hyperlipidemia    Hypertension    Hyperthyroidism 10/17/2023   Iron  deficiency anemia 12/30/2022   Left hip pain 11/28/2023   Mild recurrent major depression 03/15/2023   Need for assistance with personal care 03/15/2023   Obesity 10/05/2016   Other and unspecified hyperlipidemia    Other pulmonary embolism and infarction    Pneumonia, organism unspecified(486)    Pulmonary embolism (HCC)    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   Right foot pain 06/09/2015   SOB (shortness of breath) 10/05/2016   Trigger little finger of right hand 07/15/2020   Unspecified asthma(493.90)    Vitamin B12 deficiency     PAST SURGICAL HISTORY: Past Surgical History:  Procedure Laterality Date   CATARACT EXTRACTION Right    KNEE ARTHROSCOPY     bilateral   left knee replacement  01/05/2008   left shoulder  01/04/1993   NASAL SINUS SURGERY  age 53   TONSILLECTOMY  34   TRIGGER FINGER RELEASE Right 01/20/2023   Procedure: RIGHT RING FINGER TRIGGER RELEASE;  Surgeon: Murrell Drivers, MD;  Location: [facility];  Service: Orthopedics;  Laterality: Right;   TUBAL LIGATION  age 26    ALLERGIES: Allergies[1]  FAMILY HISTORY:  Family History  Problem Relation Age of Onset   Rheum arthritis Mother    Hypertension Mother    Hyperlipidemia Mother    Heart disease Mother    Cancer Brother    Colon cancer Cousin    Breast cancer Cousin     SOCIAL HISTORY: Social History   Socioeconomic History   Marital status: Married    Spouse name: Not on file   Number of children: Not on  file   Years of education: Not on file   Highest education level: Not on file  Occupational History   Occupation: retired     Comment: furniture company  Tobacco Use   Smoking status: Never   Smokeless tobacco: Never  Substance and Sexual Activity   Alcohol use: No   Drug use: Never   Sexual activity: Not on file  Other Topics Concern   Not on file  Social History Narrative   Not on file   Social Drivers of Health   Tobacco Use: Low Risk (01/13/2024)   Patient History    Smoking Tobacco Use: Never    Smokeless Tobacco Use: Never    Passive Exposure: Not on file  Financial Resource Strain: Not on file  Food Insecurity: Low Risk (02/04/2023)   Received from [facility]  Epic    Within the past 12 months, you worried that your food would run out before you got money to buy more: Never true    Within the past 12 months, the food you bought just didn't last and you didn't have money to get more. : Never true  Transportation Needs: No Transportation Needs (02/04/2023)   Received from [facility]    In the past 12 months, has lack of reliable transportation kept you from medical appointments, meetings, work or from getting things needed for daily living? : No  Physical Activity: Not on file  Stress: Not on file  Social Connections: Not on file  Depression (PHQ2-9): Low Risk (12/23/2023)   Depression (PHQ2-9)    PHQ-2 Score: 0  Alcohol Screen: Not on file  Housing: Low Risk (02/04/2023)   Received from [facility]   Epic    What is your living situation today?: I have a steady place to live    Think about the place you live. Do you have problems with any of the following? Choose all that apply:: None/None on this list  Utilities: Low Risk (02/04/2023)   Received from [facility]   Utilities    In the past 12 months has the electric, gas, oil, or water company threatened to shut off services in your home? : No  Health Literacy: Not on file     MEDICATIONS:  Current Outpatient Medications  Medication Sig Dispense Refill   carvedilol (COREG) 3.125 MG tablet Take 3.125 mg by mouth 2 (two) times daily with a meal.     Cetirizine HCl (ZYRTEC PO) Take 1 tablet by mouth daily.     citalopram (CELEXA) 40 MG tablet Take 40 mg by mouth daily.     fluticasone (FLONASE) 50 MCG/ACT nasal spray Place 2 sprays into both nostrils daily.     furosemide (LASIX) 40 MG tablet Take 40 mg by mouth 2 (two) times daily.     ONETOUCH ULTRA TEST test strip 1 each daily.     propylthiouracil  (PTU) 50 MG tablet Take 2 tablets (100 mg total) by mouth 3 (three) times daily. 180 tablet 0   rosuvastatin (CRESTOR) 5 MG tablet Take 2.5 mg by mouth 2 (two) times a week.     traMADol  (ULTRAM ) 50 MG tablet Take 1 tablet (50 mg total) by mouth every 6 (six) hours as needed. 20 tablet 0   No current facility-administered medications for this visit.    PHYSICAL EXAM: Vitals:   01/13/24 1103  BP: 138/60  Pulse: 77  Resp: 16  SpO2: 96%  Weight: 163 lb 6.4 oz (74.1 kg)  Height: 5' 5 (1.651 m)   Body mass index is 27.19 kg/m.  Wt Readings from Last 3 Encounters:  01/13/24 163 lb 6.4 oz (74.1 kg)  12/23/23 161 lb (73 kg)  12/21/23 159 lb 12.8 oz (72.5 kg)    General: Well developed, well nourished female in no apparent distress.  HEENT: AT/[place], no external lesions. Hearing intact to the spoken word Eyes: EOMI. No stare, proptosis or lid lag. Conjunctiva clear and no icterus. No erythema or watering Neck: Trachea midline, neck supple without appreciable thyromegaly or lymphadenopathy and no palpable thyroid  nodules Lungs: Clear to auscultation, no wheeze. Respirations not labored Heart: S1S2, Regular in rate and rhythm. Abdomen: Soft, non tender Neurologic: Alert, oriented, normal speech, deep tendon biceps reflexes normal,  no gross focal neurological deficit Extremities: No pedal pitting edema, + tremors of outstretched hands, ?  Essential  tremors Skin: Warm, color good. Bruises + Psychiatric: Does not appear depressed or anxious  PERTINENT HISTORIC LABORATORY AND IMAGING STUDIES:  All pertinent laboratory results were reviewed. Please see HPI also for further details.   TSH  Date Value Ref Range Status  12/23/2023 <0.100 (L) 0.350 - 4.500 uIU/mL Final    Comment:    Performed at [facility], [address] [place], [place] [address]  12/21/2023 <0.005 (L) 0.450 - 4.500 uIU/mL Final  10/17/2023 <0.100 (L) 0.350 - 4.500 uIU/mL Final    Comment:    Performed at [facility], [address], [place], [place] [address]    Lab Results  Component Value Date   FREET4 2.22 (H) 12/23/2023   FREET4 2.44 (H) 12/21/2023   FREET4 2.31 (H) 10/17/2023   T3FREE 4.5 (H) 12/21/2023   TSH <0.100 (L) 12/23/2023   TSH <0.005 (L) 12/21/2023   TSH <0.100 (L) 10/17/2023    No results found for: [facility]  Lab Results  Component Value Date   TSH <0.100 (L) 12/23/2023   TSH <0.005 (L) 12/21/2023   TSH <0.100 (L) 10/17/2023   FREET4 2.22 (H) 12/23/2023   FREET4 2.44 (H) 12/21/2023   FREET4 2.31 (H) 10/17/2023     No results found for: TSI   No components found for: TRAB    ASSESSMENT / PLAN  1. Hyperthyroidism    Patient has hyperthyroidism, noted at least from June 2025, she had elevated free T4 : 2.44 and free T3 : 4.5 with undetectable TSH in December 2025 consistent with hyperthyroidism.  Etiology of hyperthyroidism is unclear at this time.  Differential includes autoimmune hyperthyroidism/Graves' disease, hyperfunctioning thyroid  nodule and thyroiditis.  Although patient had occasional sore throat and neck soreness thyroiditis is less likely as she had persistently low TSH at least for 6+ months.  She needs additional laboratory and possibly imaging tests for hyperthyroidism evaluation.  - Patient was started on propylthiouracil  100 mg 3 times a day, from mid of December  2025.  Plan: - Check thyroid  function test TSH, free T4, free T3. - Check thyroid  autoantibodies for hyperthyroidism/Graves' disease TSI and TRAb. - She has complaints of increased bruising, no other obvious features of liver toxicity or bone marrow suppression.  I would like to check CBC and CMP today.  She had normal liver enzymes and WBC/neutrophil in December. -Consider ultrasound thyroid .  Consider radioactive iodine nuclear scan if needed. - She is on relatively high dose of antithyroid medication/PTU.  After the test results from today I will change antithyroid medication to methimazole  once a day.  Will determine dose based on the test results from today. - She needs close endocrinology follow-up.   Diagnoses and all orders for this visit:  Hyperthyroidism -     T3, free -     T4, free -     TSH -     TRAb (TSH Receptor Binding Antibody) -     Thyroid  stimulating immunoglobulin -     Comprehensive metabolic panel with GFR -     CBC with Differential/Platelet    DISPOSITION Follow up in clinic in 2 months suggested.  Labs today as ordered.  All questions answered and patient verbalized understanding of the plan.  Kruz Chiu, MD [facility] Endocrinology [facility] [address] [place], [address] [place], [place] [address] Phone # [phone]   At least part of this note was generated using voice recognition software. Inadvertent word errors may have  occurred, which were not recognized during the proofreading process.     [1]  Allergies Allergen Reactions   Prednisone Anaphylaxis    REACTION: mood swings   Doxycycline     Other Reaction(s): Generalized rash (code- 274880993, SNOMED CT)   Gabapentin     Other Reaction(s): Altered mental status (code- 580715995, SNOMED CT)   Lovastatin    Telmisartan     Other Reaction(s): Generalized rash (code- 274880993, SNOMED CT)   "

## 2024-01-13 NOTE — Progress Notes (Signed)
 Attempted to obtain medical history for pre op call via telephone, unable to reach at this time. HIPAA compliant voicemail message left requesting return call to pre surgical testing department.

## 2024-01-14 ENCOUNTER — Other Ambulatory Visit: Payer: Self-pay | Admitting: Cardiology

## 2024-01-14 LAB — COMPREHENSIVE METABOLIC PANEL WITH GFR
AG Ratio: 1.7 (calc) (ref 1.0–2.5)
ALT: 18 U/L (ref 6–29)
AST: 28 U/L (ref 10–35)
Albumin: 3.7 g/dL (ref 3.6–5.1)
Alkaline phosphatase (APISO): 55 U/L (ref 37–153)
BUN/Creatinine Ratio: 23 (calc) — ABNORMAL HIGH (ref 6–22)
BUN: 24 mg/dL (ref 7–25)
CO2: 29 mmol/L (ref 20–32)
Calcium: 8.6 mg/dL (ref 8.6–10.4)
Chloride: 102 mmol/L (ref 98–110)
Creat: 1.04 mg/dL — ABNORMAL HIGH (ref 0.60–1.00)
Globulin: 2.2 g/dL (ref 1.9–3.7)
Glucose, Bld: 107 mg/dL — ABNORMAL HIGH (ref 65–99)
Potassium: 4.4 mmol/L (ref 3.5–5.3)
Sodium: 138 mmol/L (ref 135–146)
Total Bilirubin: 0.4 mg/dL (ref 0.2–1.2)
Total Protein: 5.9 g/dL — ABNORMAL LOW (ref 6.1–8.1)
eGFR: 55 mL/min/1.73m2 — ABNORMAL LOW

## 2024-01-14 LAB — CBC WITH DIFFERENTIAL/PLATELET
Absolute Lymphocytes: 1452 {cells}/uL (ref 850–3900)
Absolute Monocytes: 446 {cells}/uL (ref 200–950)
Basophils Absolute: 61 {cells}/uL (ref 0–200)
Basophils Relative: 1.1 %
Eosinophils Absolute: 231 {cells}/uL (ref 15–500)
Eosinophils Relative: 4.2 %
HCT: 34.7 % — ABNORMAL LOW (ref 35.9–46.0)
Hemoglobin: 10.8 g/dL — ABNORMAL LOW (ref 11.7–15.5)
MCH: 27.3 pg (ref 27.0–33.0)
MCHC: 31.1 g/dL — ABNORMAL LOW (ref 31.6–35.4)
MCV: 87.6 fL (ref 81.4–101.7)
MPV: 11.3 fL (ref 7.5–12.5)
Monocytes Relative: 8.1 %
Neutro Abs: 3311 {cells}/uL (ref 1500–7800)
Neutrophils Relative %: 60.2 %
Platelets: 249 Thousand/uL (ref 140–400)
RBC: 3.96 Million/uL (ref 3.80–5.10)
RDW: 15.1 % — ABNORMAL HIGH (ref 11.0–15.0)
Total Lymphocyte: 26.4 %
WBC: 5.5 Thousand/uL (ref 3.8–10.8)

## 2024-01-15 NOTE — ED Provider Notes (Signed)
 EMERGENCY DEPARTMENT PROVIDER NOTE ADDENDUM [name]  Patient Name: [name] Date of Birth:  1945/11/25 MRN: [id number]  This note is an addendum to the earlier ED provider notes and documents any care or updates through the ED course. Please see any specific documentation below.  ED Course and Medical Decision Making  Procedures  Problems addressed during today's ED encounter were acute and threat to life or bodily functions: 1. Behavior concern     ED Course as of 01/15/24 2206  Juliane Blunt [facility] Documentation  [place] Jan 15, 2024  2122 Patient received in sign-out.  Pending lab evaluation which had just been ordered at the time of my addition to the care team.  Initial provider does not feel patient would benefit from CT head.  We are currently on CT diversion.  Patient will be given losartan in addition to the Coreg already received for her hypertension.  Labs pending.  2156 Labs reviewed.  Cbc unremarkable.  CMP unremarkable.  UA without infection.  Ethanol negative.  Magnesium normal.  Drug screen negative.  2206 TSH 3rd Generation(!): 0.01      Clinical Impressions as of 01/15/24 2206  Behavior concern   The following chronic conditions affected patient care and were used in the medical decision making:  has a past medical history of Anxiety, Diabetes mellitus (CMS/HCC), H/O blood clots, Hyperlipidemia, and Hypertension.   The following medications affected patient care and were used in the medical decision making: Medications  carvediloL (COREG) tablet 3.125 mg (3.125 mg oral Given 01/15/24 2034)  losartan (COZAAR) tablet 50 mg (50 mg oral Given 01/15/24 2130)    Juliane Marolyn Schultze, MD Emergency Medicine   ED Disposition  Discharge   ED Discharge Medications   ED Prescriptions   None    Only medications prescribed or modified are during this ED encounter are included in this list.    Juliane Blunt Schultze,  MD 01/15/24 2206

## 2024-01-15 NOTE — ED Provider Notes (Signed)
 EMERGENCY DEPARTMENT PROVIDER NOTE [name]   Patient Name: [name] Date of Birth:  07-06-1945 MRN: [id number]  History of Present Illness  HPI  Chief Complaint  Patient presents with   Medical Evaluation    The patient is a 79 year old female who was recently diagnosed with hyperthyroidism was brought to emergency department by her family secondary to several days of increasing confusion and tremor.  She has also had some false beliefs, such as thinking that her best friend is stealing things from her, when in fact these items are not I should missing from her home.  The patient does not acknowledge any of the symptoms, but the family who is with her all the time confirms them.  She has had no fever.  She has had no trauma.        Medical and Social History  Patient History Medical History[1] Surgical History[2] Family History[3] Social History[4]    Review of Systems  Review of Systems Review of Systems  All other systems reviewed and are negative.     Physical Exam  Physical Exam ED Triage Vitals [01/15/24 1841]  Temp Pulse Resp BP SpO2  36.7 C (98.1 F) (!) 92 20 (!) 187/79 95 %    Temp Source Heart Rate Source Patient Position BP Location FiO2 (%)  Oral Monitor -- Left arm --   Physical Exam Vitals and nursing note reviewed.  Constitutional:      General: She is not in acute distress. HENT:     Head: Atraumatic.  Eyes:     Extraocular Movements: Extraocular movements intact.  Cardiovascular:     Rate and Rhythm: Normal rate.  Pulmonary:     Effort: Pulmonary effort is normal.     Breath sounds: Normal breath sounds.  Abdominal:     Palpations: Abdomen is soft.  Musculoskeletal:        General: No deformity.     Cervical back: Normal range of motion.  Skin:    General: Skin is warm.  Neurological:     Mental Status: She is alert.     Comments: She is alert and oriented x3.  She has a a fine tremor of  her facial muscles and hands.  She has normal strength and sensation.        ED Course and Medical Decision Making   Medical Decision Making      ED Disposition  There was no disposition selected the last time this was refreshed.  Please see AVS for any med changes.       [1] Past Medical History: Diagnosis Date   Anxiety    Diabetes mellitus (CMS/HCC)    H/O blood clots    Hyperlipidemia    Hypertension   [2] Past Surgical History: Procedure Laterality Date   HAND SURGERY     KNEE SURGERY     TUBAL LIGATION    [3] No family history on file. [4] Social History Tobacco Use   Smoking status: Never    Passive exposure: Never   Smokeless tobacco: Never  Substance Use Topics   Alcohol use: Defer   Drug use: Never   Krystal FORBES Idol, MD 01/15/24 1940

## 2024-01-16 ENCOUNTER — Telehealth: Payer: Self-pay | Admitting: Endocrinology

## 2024-01-16 ENCOUNTER — Other Ambulatory Visit: Payer: Self-pay

## 2024-01-16 ENCOUNTER — Ambulatory Visit: Payer: Self-pay | Admitting: Endocrinology

## 2024-01-16 DIAGNOSIS — E0590 Thyrotoxicosis, unspecified without thyrotoxic crisis or storm: Secondary | ICD-10-CM

## 2024-01-16 MED ORDER — METHIMAZOLE 5 MG PO TABS: 10.0000 mg | ORAL_TABLET | Freq: Every day | ORAL | 3 refills | Status: AC

## 2024-01-16 NOTE — Telephone Encounter (Signed)
 Attempted to follow up with patient but no answer or voicemail.

## 2024-01-16 NOTE — Telephone Encounter (Addendum)
 Reviewed records patient had mildly elevated free T4 on January 11 in outside facility in ED.  Patient had normal free T4 and free T3 when I checked lab on January 9.  Patient has been taking propylthiouracil  100 mg 3 times a day.  She has been taking this medication since mid of December.  She has improvement on thyroid  hormone levels after being on this medication/propylthiouracil .  She has hyperthyroidism.  Some of the labs results I checked on January 9 are awaited.  On January 9 she had acceptable CBC and CMP with normal WBC and liver enzymes.  In outside facility on January 11 she had normal liver enzymes and normal WBC.  Based on test results she is less likely to have side effect from propylthiouracil .  It is unlikely to have mental issues due to taking propylthiouracil .  Although she had normal thyroid  hormone levels free T4 and free T3 on January 9, on her ER visit she has mildly elevated free T4.  Mental issue can happen with elevated thyroid  hormone levels though.  Plan: - Mental status changes and additional evaluation and management of hyperthyroidism at her ED will be done by her ED provider. - For the continue and regular management of hyperthyroidism as discussed in clinic visit I would like to change, current propylthiouracil  to methimazole  which can be taken once a day. - I sent prescription for methimazole  10 mg daily.  Discontinue propylthiouracil .  Kathryn Hilburn, MD [facility] Endocrinology [facility] [address] [place], [address] [place], [place] [address] Phone # [phone]

## 2024-01-16 NOTE — Telephone Encounter (Signed)
 Patient's granddaughter called this morning,stating patient was in the ED over the weekend due some mental issues that the hospital believes is coming from her thyroid  medication.Granddaughter stated she patient is not in a good state and would like to be called back ASAP,please advise.

## 2024-01-16 NOTE — Telephone Encounter (Signed)
 Attempted to contact patients granddaughter regarding mental status and Dr. Eugenio recommendation. Unable to speak with Luke and left a message for patient's granddaughter to call back.

## 2024-01-16 NOTE — Addendum Note (Signed)
 Addended by: Shannyn Jankowiak on: 01/16/2024 01:37 PM   Modules accepted: Orders

## 2024-01-16 NOTE — Telephone Encounter (Signed)
 Pt of Dr. Monetta. Does Dr. Monetta want to refill this RX? Please advise.

## 2024-01-17 ENCOUNTER — Telehealth: Payer: Self-pay | Admitting: Gastroenterology

## 2024-01-17 LAB — TRAB (TSH RECEPTOR BINDING ANTIBODY): TRAB: 7.47 IU/L — ABNORMAL HIGH

## 2024-01-17 LAB — TSH: TSH: <0.01 m[IU]/L — ABNORMAL LOW (ref 0.40–4.50)

## 2024-01-17 LAB — T3, FREE: T3, Free: 3.1 pg/mL (ref 2.3–4.2)

## 2024-01-17 LAB — T4, FREE: Free T4: 1.7 ng/dL (ref 0.8–1.8)

## 2024-01-17 LAB — THYROID STIMULATING IMMUNOGLOBULIN: TSI: 256 %{baseline} — ABNORMAL HIGH

## 2024-01-17 NOTE — Telephone Encounter (Signed)
 Patient's daughter given medication changes  and next steps of treatment as directed by MD. No further questions at this time.

## 2024-01-17 NOTE — Telephone Encounter (Signed)
 Procedure:Colonoscopy/Endoscopy Procedure date: 01/26/24 Procedure location: [facility] Arrival Time: 7:00 am Spoke with the patient Y/N:   No, I left a detailed message on [phone] on 01/17/24 @ 2:12 pm for the patient to return call   Yes, 01/18/24 @ 2:43 pm  Any prep concerns? No  Has the patient obtained the prep from the pharmacy ? No, not yet but will pick it up Do you have a care partner and transportation: Yes Any additional concerns? Yes, patient is requesting that phenergan  or Zofran  be called in to take before taking the prep because the last time she got sick and couldn't keep the medication down.

## 2024-01-19 NOTE — Telephone Encounter (Signed)
 Left message for patient to call back

## 2024-01-20 ENCOUNTER — Other Ambulatory Visit: Payer: Self-pay

## 2024-01-20 DIAGNOSIS — R112 Nausea with vomiting, unspecified: Secondary | ICD-10-CM

## 2024-01-20 MED ORDER — ONDANSETRON HCL 4 MG PO TABS: 4.0000 mg | ORAL_TABLET | Freq: Four times a day (QID) | ORAL | 0 refills | Status: AC | PRN

## 2024-01-20 NOTE — Telephone Encounter (Signed)
 Pt was contacted in regard to the message below. Pt stated that she is good for the date for her procedure. Pt is requesting that a prescription  be sent in to her pharmacy as she stated that the prescription that was sent in prior was no longer good due to the fact that she waited so long to pick it up. Chart was reviewed and noted where Alan Coombs PA had prescribed the Zofran  prior. Prescription was sent to pharmacy . Pt was made aware.  Pt verbalized understanding with all questions answered.

## 2024-01-23 ENCOUNTER — Encounter: Payer: Self-pay | Admitting: Oncology

## 2024-01-24 ENCOUNTER — Telehealth: Payer: Self-pay | Admitting: Gastroenterology

## 2024-01-24 NOTE — Telephone Encounter (Signed)
 Patient would like to confirm Medicare will cover this weeks procedure. Please advise, thank you

## 2024-01-25 ENCOUNTER — Telehealth: Payer: Self-pay | Admitting: Gastroenterology

## 2024-01-25 NOTE — Anesthesia Preprocedure Evaluation (Signed)
 "                                  Anesthesia Evaluation  Patient identified by MRN, date of birth, ID band Patient awake    Reviewed: Allergy & Precautions, NPO status , Patient's Chart, lab work & pertinent test results  History of Anesthesia Complications Negative for: history of anesthetic complications  Airway Mallampati: III  TM Distance: >3 FB Neck ROM: Full    Dental no notable dental hx.    Pulmonary neg pulmonary ROS   Pulmonary exam normal breath sounds clear to auscultation       Cardiovascular hypertension, Pt. on medications and Pt. on home beta blockers Normal cardiovascular exam Rhythm:Regular Rate:Normal  Echo   1. Left ventricular ejection fraction, by estimation, is 55 to 60%. The  left ventricle has normal function. Left ventricular endocardial border  not optimally defined to evaluate regional wall motion. Left ventricular  diastolic parameters were normal.   2. Right ventricular systolic function is normal. The right ventricular  size is normal.   3. The mitral valve is normal in structure. No evidence of mitral valve  regurgitation. No evidence of mitral stenosis.   4. The aortic valve is tricuspid. Aortic valve regurgitation is not  visualized. No aortic stenosis is present.     Neuro/Psych  PSYCHIATRIC DISORDERS Anxiety Depression    negative neurological ROS     GI/Hepatic Neg liver ROS,GERD  Medicated,,  Endo/Other  diabetes, Type 2 Hyperthyroidism   Renal/GU Renal InsufficiencyRenal disease (Cr 1.29)     Musculoskeletal  (+) Arthritis ,    Abdominal   Peds  Hematology  (+) Blood dyscrasia (Hgb 10.4), anemia   Anesthesia Other Findings Pt states she has a chronic LE DVT  Reproductive/Obstetrics                              Anesthesia Physical Anesthesia Plan  ASA: 3  Anesthesia Plan: MAC   Post-op Pain Management: Minimal or no pain anticipated   Induction:   PONV Risk Score and  Plan: 2 and Treatment may vary due to age or medical condition, Propofol  infusion and TIVA  Airway Management Planned: Natural Airway and Nasal Cannula  Additional Equipment: None  Intra-op Plan:   Post-operative Plan:   Informed Consent: I have reviewed the patients History and Physical, chart, labs and discussed the procedure including the risks, benefits and alternatives for the proposed anesthesia with the patient or authorized representative who has indicated his/her understanding and acceptance.     Dental advisory given  Plan Discussed with: CRNA  Anesthesia Plan Comments: (Risks of anesthesia explained at length. This includes, but is not limited to, sore throat, damage to teeth, lips gums, tongue and vocal cords, nausea and vomiting, reactions to medications, stroke, heart attack, and death. All patient questions were answered and the patient wishes to proceed.    Lab work from 1/9 reviewed and adequate to proceed. Just prior to patient going back, I saw new labs that were ordered by Dr. Charlanne. We delayed going to the procedure for Dr. Charlanne to review. We discussed proceeding vs. Delaying. Dr. Charlanne believes the likely culprit is bowel prep. We discussed with the patient and she wishes to proceed. Dr. Charlanne will have her labs redrawn Monday. )         Anesthesia Quick Evaluation  "

## 2024-01-25 NOTE — Telephone Encounter (Signed)
 Incoming call from pt regarding prep for colonoscopy tomorrow 01/26/2024. Pt stated she mixed to much liquid with the miralax. Prep starts at 3pm today 01/25/2024. Please advise. Thank you.

## 2024-01-25 NOTE — Telephone Encounter (Signed)
 Returned pts call and answered all questions about her prep for procedure.

## 2024-01-26 ENCOUNTER — Ambulatory Visit (HOSPITAL_COMMUNITY)
Admission: RE | Admit: 2024-01-26 | Discharge: 2024-01-26 | Disposition: A | Discharge status: Home / Self Care | Attending: Gastroenterology | Admitting: Gastroenterology

## 2024-01-26 ENCOUNTER — Other Ambulatory Visit: Payer: Self-pay

## 2024-01-26 ENCOUNTER — Encounter (HOSPITAL_COMMUNITY)
Admission: RE | Disposition: A | Discharge status: Home / Self Care | Payer: Self-pay | Source: Home / Self Care | Attending: Gastroenterology

## 2024-01-26 ENCOUNTER — Encounter (HOSPITAL_COMMUNITY): Payer: Self-pay | Admitting: Anesthesiology

## 2024-01-26 ENCOUNTER — Encounter (HOSPITAL_COMMUNITY): Payer: Self-pay | Admitting: Gastroenterology

## 2024-01-26 ENCOUNTER — Ambulatory Visit (HOSPITAL_COMMUNITY): Payer: Self-pay | Admitting: Anesthesiology

## 2024-01-26 DIAGNOSIS — N189 Chronic kidney disease, unspecified: Secondary | ICD-10-CM | POA: Diagnosis not present

## 2024-01-26 DIAGNOSIS — D509 Iron deficiency anemia, unspecified: Secondary | ICD-10-CM

## 2024-01-26 DIAGNOSIS — K5140 Inflammatory polyps of colon without complications: Secondary | ICD-10-CM | POA: Diagnosis not present

## 2024-01-26 DIAGNOSIS — M1990 Unspecified osteoarthritis, unspecified site: Secondary | ICD-10-CM | POA: Diagnosis not present

## 2024-01-26 DIAGNOSIS — F419 Anxiety disorder, unspecified: Secondary | ICD-10-CM | POA: Insufficient documentation

## 2024-01-26 DIAGNOSIS — Z79899 Other long term (current) drug therapy: Secondary | ICD-10-CM | POA: Insufficient documentation

## 2024-01-26 DIAGNOSIS — Z800 Family history of malignant neoplasm of digestive organs: Secondary | ICD-10-CM | POA: Diagnosis not present

## 2024-01-26 DIAGNOSIS — K640 First degree hemorrhoids: Secondary | ICD-10-CM

## 2024-01-26 DIAGNOSIS — Z8711 Personal history of peptic ulcer disease: Secondary | ICD-10-CM | POA: Diagnosis not present

## 2024-01-26 DIAGNOSIS — D127 Benign neoplasm of rectosigmoid junction: Secondary | ICD-10-CM

## 2024-01-26 DIAGNOSIS — I872 Venous insufficiency (chronic) (peripheral): Secondary | ICD-10-CM | POA: Diagnosis not present

## 2024-01-26 DIAGNOSIS — E0590 Thyrotoxicosis, unspecified without thyrotoxic crisis or storm: Secondary | ICD-10-CM | POA: Diagnosis not present

## 2024-01-26 DIAGNOSIS — K5730 Diverticulosis of large intestine without perforation or abscess without bleeding: Secondary | ICD-10-CM

## 2024-01-26 DIAGNOSIS — K219 Gastro-esophageal reflux disease without esophagitis: Secondary | ICD-10-CM

## 2024-01-26 DIAGNOSIS — I10 Essential (primary) hypertension: Secondary | ICD-10-CM

## 2024-01-26 DIAGNOSIS — Z8249 Family history of ischemic heart disease and other diseases of the circulatory system: Secondary | ICD-10-CM | POA: Diagnosis not present

## 2024-01-26 DIAGNOSIS — I129 Hypertensive chronic kidney disease with stage 1 through stage 4 chronic kidney disease, or unspecified chronic kidney disease: Secondary | ICD-10-CM | POA: Insufficient documentation

## 2024-01-26 DIAGNOSIS — K3189 Other diseases of stomach and duodenum: Secondary | ICD-10-CM | POA: Diagnosis not present

## 2024-01-26 DIAGNOSIS — Z7982 Long term (current) use of aspirin: Secondary | ICD-10-CM | POA: Diagnosis not present

## 2024-01-26 DIAGNOSIS — Z86711 Personal history of pulmonary embolism: Secondary | ICD-10-CM | POA: Diagnosis not present

## 2024-01-26 DIAGNOSIS — F32A Depression, unspecified: Secondary | ICD-10-CM | POA: Diagnosis not present

## 2024-01-26 DIAGNOSIS — R1013 Epigastric pain: Secondary | ICD-10-CM

## 2024-01-26 DIAGNOSIS — K259 Gastric ulcer, unspecified as acute or chronic, without hemorrhage or perforation: Secondary | ICD-10-CM

## 2024-01-26 DIAGNOSIS — Z860100 Personal history of colon polyps, unspecified: Secondary | ICD-10-CM | POA: Diagnosis not present

## 2024-01-26 DIAGNOSIS — Z85820 Personal history of malignant melanoma of skin: Secondary | ICD-10-CM | POA: Insufficient documentation

## 2024-01-26 DIAGNOSIS — D126 Benign neoplasm of colon, unspecified: Secondary | ICD-10-CM

## 2024-01-26 DIAGNOSIS — Z86718 Personal history of other venous thrombosis and embolism: Secondary | ICD-10-CM | POA: Insufficient documentation

## 2024-01-26 DIAGNOSIS — E1122 Type 2 diabetes mellitus with diabetic chronic kidney disease: Secondary | ICD-10-CM | POA: Diagnosis not present

## 2024-01-26 DIAGNOSIS — K5520 Angiodysplasia of colon without hemorrhage: Secondary | ICD-10-CM

## 2024-01-26 LAB — CBC
HCT: 33.5 % — ABNORMAL LOW (ref 36.0–46.0)
Hemoglobin: 11.4 g/dL — ABNORMAL LOW (ref 12.0–15.0)
MCH: 28.6 pg (ref 26.0–34.0)
MCHC: 34 g/dL (ref 30.0–36.0)
MCV: 84 fL (ref 80.0–100.0)
Platelets: 318 K/uL (ref 150–400)
RBC: 3.99 MIL/uL (ref 3.87–5.11)
RDW: 15.7 % — ABNORMAL HIGH (ref 11.5–15.5)
WBC: 6.3 K/uL (ref 4.0–10.5)
nRBC: 0 % (ref 0.0–0.2)

## 2024-01-26 LAB — COMPREHENSIVE METABOLIC PANEL WITH GFR
ALT: 21 U/L (ref 0–44)
AST: 43 U/L — ABNORMAL HIGH (ref 15–41)
Albumin: 3.9 g/dL (ref 3.5–5.0)
Alkaline Phosphatase: 53 U/L (ref 38–126)
Anion gap: 12 (ref 5–15)
BUN: 18 mg/dL (ref 8–23)
CO2: 25 mmol/L (ref 22–32)
Calcium: 8.9 mg/dL (ref 8.9–10.3)
Chloride: 90 mmol/L — ABNORMAL LOW (ref 98–111)
Creatinine, Ser: 1.34 mg/dL — ABNORMAL HIGH (ref 0.44–1.00)
GFR, Estimated: 40 mL/min — ABNORMAL LOW
Glucose, Bld: 103 mg/dL — ABNORMAL HIGH (ref 70–99)
Potassium: 3.7 mmol/L (ref 3.5–5.1)
Sodium: 127 mmol/L — ABNORMAL LOW (ref 135–145)
Total Bilirubin: 0.5 mg/dL (ref 0.0–1.2)
Total Protein: 6.7 g/dL (ref 6.5–8.1)

## 2024-01-26 MED ORDER — PROPOFOL 1000 MG/100ML IV EMUL
INTRAVENOUS | Status: AC
  Filled 2024-01-26: qty 100

## 2024-01-26 MED ORDER — PROPOFOL 500 MG/50ML IV EMUL
INTRAVENOUS | Status: DC | PRN
  Administered 2024-01-26: 125 ug/kg/min via INTRAVENOUS

## 2024-01-26 MED ORDER — HYDRALAZINE HCL 20 MG/ML IJ SOLN
INTRAMUSCULAR | Status: AC
  Filled 2024-01-26: qty 1

## 2024-01-26 MED ORDER — SODIUM CHLORIDE 0.9 % IV SOLN: INTRAVENOUS | Status: DC

## 2024-01-26 MED ORDER — PROPOFOL 10 MG/ML IV BOLUS
INTRAVENOUS | Status: DC | PRN
  Administered 2024-01-26 (×5): 20 mg via INTRAVENOUS

## 2024-01-26 MED ORDER — LIDOCAINE HCL (CARDIAC) PF 100 MG/5ML IV SOSY
PREFILLED_SYRINGE | INTRAVENOUS | Status: DC | PRN
  Administered 2024-01-26: 100 mg via INTRAVENOUS

## 2024-01-26 MED ORDER — HYDRALAZINE HCL 20 MG/ML IJ SOLN
10.0000 mg | Freq: Once | INTRAMUSCULAR | Status: AC
  Administered 2024-01-26: 10 mg via INTRAVENOUS

## 2024-01-26 MED ORDER — PANTOPRAZOLE SODIUM 20 MG PO TBEC: 20.0000 mg | DELAYED_RELEASE_TABLET | Freq: Every day | ORAL | 11 refills | Status: AC

## 2024-01-26 NOTE — Anesthesia Procedure Notes (Signed)
 Date/Time: 01/26/2024 8:50 AM  Performed by: Therisa Doyal CROME, CRNAOxygen Delivery Method: Simple face mask

## 2024-01-26 NOTE — Op Note (Signed)
 [facility] Patient Name: [name] Procedure Date: 01/26/2024 MRN: [id number] Attending MD: Lynnie Bring , MD, [id number] Date of Birth: Jul 17, 1945 CSN: [id number] Age: 79 Admit Type: Outpatient Procedure:                Colonoscopy Indications:              Iron  deficiency anemia. H/O reported advanced cecal                            colonic polyp status post polypectomy by Dr.                            Larene Providers:                Lynnie Bring, MD, Gregoria Pierce, RN, Felice Sar, Technician Referring MD:             Dr Brad Badder Medicines:                Monitored Anesthesia Care Complications:            No immediate complications. Estimated Blood Loss:     Estimated blood loss: none. Procedure:                Pre-Anesthesia Assessment:                           - Prior to the procedure, a History and Physical                            was performed, and patient medications and                            allergies were reviewed. The patient's tolerance of                            previous anesthesia was also reviewed. The risks                            and benefits of the procedure and the sedation                            options and risks were discussed with the patient.                            All questions were answered, and informed consent                            was obtained. Prior Anticoagulants: The patient has                            taken no anticoagulant or antiplatelet agents. ASA                            Grade Assessment:  III - A patient with severe                            systemic disease. After reviewing the risks and                            benefits, the patient was deemed in satisfactory                            condition to undergo the procedure.                           After obtaining informed consent, the colonoscope                            was passed under direct  vision. Throughout the                            procedure, the patient's blood pressure, pulse, and                            oxygen  saturations were monitored continuously. The                            [id number]-[id number] ([id number] Olympus colonoscope was                            introduced through the anus and advanced to the 2                            cm into the ileum. The colonoscopy was performed                            without difficulty. The patient tolerated the                            procedure well. The quality of the bowel                            preparation was adequate to identify polyps. The                            terminal ileum, ileocecal valve, appendiceal                            orifice, and rectum were photographed. Scope In: 9:05:28 AM Scope Out: 9:27:53 AM Scope Withdrawal Time: 0 hours 18 minutes 38 seconds  Total Procedure Duration: 0 hours 22 minutes 25 seconds  Findings:      A 4 mm polyp was found in the recto-sigmoid colon. The polyp was       sessile. The polyp was removed with a cold snare. Resection and       retrieval were complete.      A single medium-sized localized AVM with typical arborization without  bleeding was found in the cecum. Fulguration to ablate the lesion by       argon plasma at 0.4 liters/minute and 20 watts was successful.      A few medium-mouthed diverticula were found in the sigmoid colon.      Non-bleeding internal hemorrhoids were found during retroflexion. The       hemorrhoids were small and Grade I (internal hemorrhoids that do not       prolapse).      The terminal ileum appeared normal.      Retroflexion in the right colon was performed.      The exam was otherwise without abnormality on direct and retroflexion       views. The cecum was carefully examined with white light and narrowband       imaging. There was no evidence of any residual polyp. Impression:               - One 4 mm polyp at the  recto-sigmoid colon,                            removed with a cold snare. Resected and retrieved.                           - A single cecal AVM s/p APC.                           - Diverticulosis in the sigmoid colon.                           - Non-bleeding internal hemorrhoids.                           - The examined portion of the ileum was normal.                           - The examination was otherwise normal on direct                            and retroflexion views. Moderate Sedation:      Not Applicable - Patient had care per Anesthesia. Recommendation:           - Patient has a contact number available for                            emergencies. The signs and symptoms of potential                            delayed complications were discussed with the                            patient. Return to normal activities tomorrow.                            Written discharge instructions were provided to the                            patient.                           -  Resume previous diet.                           - Continue present medications. Resume baby aspirin                            1/24                           - Await pathology results.                           - Repeat colonoscopy is not recommended for                            surveillance.                           - The findings and recommendations were discussed                            with the patient's family.                           Addendum: CBC, CMP was checked prior to the                            procedure. Sodium 127 (likely due to prep), Hb had                            improved to 11.4. Cr at baseline 1.3 with GFR 40                            mL/min                           I advised patient to get in touch with Dr. Gable                            next week to get BMP repeated (for sodium) Procedure Code(s):        --- Professional ---                           [phone], Colonoscopy,  flexible; with ablation of                            tumor(s), polyp(s), or other lesion(s) (includes                            pre- and post-dilation and guide wire passage, when                            performed)                           45385, 59, Colonoscopy, flexible; with removal of  tumor(s), polyp(s), or other lesion(s) by snare                            technique Diagnosis Code(s):        --- Professional ---                           D12.7, Benign neoplasm of rectosigmoid junction                           K55.20, Angiodysplasia of colon without hemorrhage                           K64.0, First degree hemorrhoids                           D50.9, Iron  deficiency anemia, unspecified                           K57.30, Diverticulosis of large intestine without                            perforation or abscess without bleeding CPT copyright 2022 [employer]. All rights reserved. The codes documented in this report are preliminary and upon coder review may  be revised to meet current compliance requirements. Lynnie Bring, MD 01/26/2024 9:38:33 AM This report has been signed electronically. Number of Addenda: 0

## 2024-01-26 NOTE — H&P (Addendum)
 "    07/27/2023 [name] [id number] [birth year]   Referring provider: Gable Cambric, MD Primary GI doctor: Dr. Charlanne   ASSESSMENT AND PLAN:  IDA with history of gastric ulcer 03/2023 and piecemeal removed cecal high-grade dysplasia polyp 09/2021 06/14/2023  HGB 10.2 MCV 84.9 Platelets 337 06/14/2023 Iron  79 Ferritin 37 B12 339      Recent Labs    12/24/22 1222 04/14/23 1310 06/14/23 1342  HGB 9.5* 10.0* 10.2*  Follows with hematology gets iron  infusions Follows with Dr. Ezzard in [place] Stopped eliquis , Has follow up August 1st -Continue to follow up with Dr. Ezzard for supportive care -Will get EGD/colon in the hospital due to motility issues and comorbid conditions Risk of bowel prep, conscious sedation, and EGD and colonoscopy were discussed.  Risks include but are not limited to dehydration, pain, bleeding, cardiopulmonary process, bowel perforation, or other possible adverse outcomes..  Treatment plan was discussed with patient, and agreed upon. - ER precautions discussed any further melena, vomiting blood, hematochezia, shortness of breath, chest pain go to the ER.   GERD with history of gastric ulcer 09/11/2021 EGD LA grade B esophagitis and hiatal hernia 03/07/2023 EGD for melena and coffee-ground emesis multiple clean-based ulcers negative H. Pylori started on pantoprazole  40 mg twice daily 6 weeks now on once a day suggested  No NSAIDs, no ETOH, off eliquis, on bASA q 3 day Some AB discomfort denies nausea,vomiting, GERD, dysphagia, melena - continue protonix  40 mg indefinitely -  repeat EGD with colonoscopy at the hospital   Personal history of colon polyps Colonoscopy 09/11/2021 by Dr. Larene in [place] [facility] significant cecal polyp removed piecemeal fashion showed focal high-grade dysplasia and internal hemorrhoids repeat colonoscopy recommended 3 months (12/2021) but never done Has BM every 2-3 days, denies constipation - will get Xray - consider miralax and 2 day  prep for colonoscopy at the hospital   History of DVT/PE  with venous insuff and chronic pain On bASA every 3 days, she is off eliquis since GI bleed On chronic tramadol  due to leg pain Dr. Gable will not refill without our permission - patient from a GI perspective can take the tramadol  with knowledge it can worsening her constipation - continue on miralax ER precautions discussed   Type 2 diabetes with CKD   Patient Care Team: Gable Cambric, MD as PCP - General (Internal Medicine) Monetta Redell PARAS, MD as PCP - Cardiology (Cardiology) Ezzard Valaria LABOR, MD as Consulting Physician (Oncology)   HISTORY OF PRESENT ILLNESS: 79 y.o. female with a past medical history of type 2 diabetes, CKD, history of DVT PE on Eliquis, history of IDA followed by heme-onc receives iron  transfusions, GERD and others listed below presents for evaluation of gastric ulcer.    Patient transferring care to Dr. Charlanne from [facility].  Last seen 04/13/2023 for epigastric pain and gastric ulcer.   Discussed the use of AI scribe software for clinical note transcription with the patient, who gave verbal consent to proceed.   History of Present Illness   [name] is a 79 year old female with gastric ulcers who presents for follow-up.   She was previously admitted to [facility] for melanoma and experienced coffee ground emesis, leading to an EGD on March 07, 2023, which revealed multiple clean-based ulcers. Pathology was negative for H. pylori. She was treated with pantoprazole  40 mg twice daily, later reduced to once daily.   Since her hospitalization, she has minor epigastric abdominal discomfort. No  nausea, vomiting, heartburn, dysphagia, or melena. She has regular bowel movements, two to three times daily, with soft stools, though certain foods can cause constipation. Her weight and appetite are stable, and she is not currently on oral iron .   Her hematologist has discontinued Eliquis, and she is now on  low-dose aspirin every three days. No NSAID or alcohol use. She has a history of tramadol  use for venous insufficiency secondary to multiple DVTs, but her physician will not refill it without permission.   No chest pain or shortness of breath.       She  reports that she has never smoked. She has never used smokeless tobacco. She reports that she does not drink alcohol and does not use drugs.   RELEVANT GI HISTORY, IMAGING AND LABS: Results   EGD: Multiple clean-based ulcers (03/07/2023)   Pathology: Negative for H. pylori (03/07/2023)       CBC Labs (Brief)          Component Value Date/Time    WBC 4.7 06/14/2023 1342    WBC 7.9 09/12/2010 0355    RBC 3.71 (L) 06/14/2023 1342    HGB 10.2 (L) 06/14/2023 1342    HCT 31.5 (L) 06/14/2023 1342    PLT 337 06/14/2023 1342    MCV 84.9 06/14/2023 1342    MCH 27.5 06/14/2023 1342    MCHC 32.4 06/14/2023 1342    RDW 15.6 (H) 06/14/2023 1342    LYMPHSABS 0.9 06/14/2023 1342    MONOABS 0.4 06/14/2023 1342    EOSABS 0.2 06/14/2023 1342    BASOSABS 0.1 06/14/2023 1342      Recent Labs (within last 365 days)       Recent Labs    12/24/22 1222 04/14/23 1310 06/14/23 1342  HGB 9.5* 10.0* 10.2*        CMP     Labs (Brief)          Component Value Date/Time    NA 140 06/14/2023 1342    K 4.4 06/14/2023 1342    CL 103 06/14/2023 1342    CO2 26 06/14/2023 1342    GLUCOSE 120 (H) 06/14/2023 1342    BUN 34 (H) 06/14/2023 1342    CREATININE 1.38 (H) 06/14/2023 1342    CALCIUM 9.3 06/14/2023 1342    PROT 6.2 (L) 06/14/2023 1342    ALBUMIN 4.0 06/14/2023 1342    AST 29 06/14/2023 1342    ALT 13 06/14/2023 1342    ALKPHOS 49 06/14/2023 1342    BILITOT 0.4 06/14/2023 1342    GFRNONAA 39 (L) 06/14/2023 1342    GFRAA >60 09/12/2010 0355          Latest Ref Rng & Units 06/14/2023    1:42 PM 04/14/2023    1:10 PM 12/24/2022   12:22 PM  Hepatic Function  Total Protein 6.5 - 8.1 g/dL 6.2  6.0  6.1   Albumin 3.5 - 5.0 g/dL 4.0   3.7  3.7   AST 15 - 41 U/L 29  34  38   ALT 0 - 44 U/L 13  15  15    Alk Phosphatase 38 - 126 U/L 49  43  38   Total Bilirubin 0.0 - 1.2 mg/dL 0.4  0.4  0.3       Current Medications:      Current Outpatient Medications (Cardiovascular):    carvedilol (COREG) 3.125 MG tablet, Take 3.125 mg by mouth 2 (two) times daily with a meal.   Choline Fenofibrate (  FENOFIBRIC ACID) 135 MG CPDR, Take by mouth.   fenofibrate micronized (LOFIBRA) 134 MG capsule, Take 134 mg by mouth daily before breakfast.   furosemide (LASIX) 40 MG tablet, Take 40 mg by mouth 2 (two) times daily.   losartan (COZAAR) 50 MG tablet, Take 50 mg by mouth daily.   rosuvastatin (CRESTOR) 5 MG tablet, Take 2.5 mg by mouth 2 (two) times a week.   Current Outpatient Medications (Respiratory):    albuterol  (VENTOLIN  HFA) 108 (90 Base) MCG/ACT inhaler, Inhale into the lungs every 6 (six) hours as needed for wheezing or shortness of breath.   Cetirizine HCl (ZYRTEC PO), Take 1 tablet by mouth daily.   fluticasone (FLONASE) 50 MCG/ACT nasal spray, Place 2 sprays into both nostrils daily.   montelukast (SINGULAIR) 10 MG tablet, Take 10 mg by mouth at bedtime.   Current Outpatient Medications (Analgesics):    aspirin EC 81 MG tablet, Take 81 mg by mouth daily. Swallow whole.   traMADol  (ULTRAM ) 50 MG tablet, Take 1 tablet (50 mg total) by mouth every 6 (six) hours as needed.   Current Outpatient Medications (Hematological):    cyanocobalamin  (VITAMIN B12) 1000 MCG tablet, Take 1,000 mcg by mouth every 30 (thirty) days.   apixaban (ELIQUIS) 5 MG TABS tablet, Take 5 mg by mouth daily. (Patient not taking: Reported on 07/27/2023)   Current Outpatient Medications (Other):    citalopram (CELEXA) 20 MG tablet, Take 20 mg by mouth daily.   cyclobenzaprine (FLEXERIL) 10 MG tablet, Take 10 mg by mouth 2 (two) times daily as needed for muscle spasms.   Diclofenac Sodium 3 % GEL, Apply 1 Application topically daily as needed (pain).    ergocalciferol (VITAMIN D2) 1.25 MG (50000 UT) capsule, Take 50,000 Units by mouth once a week.   LORazepam (ATIVAN) 0.5 MG tablet, Take 0.5-0.75 mg by mouth at bedtime.   Na Sulfate-K Sulfate-Mg Sulfate concentrate (SUPREP BOWEL PREP KIT) 17.5-3.13-1.6 GM/177ML SOLN, Take 1 kit (354 mLs total) by mouth once for 1 dose.   Potassium Chloride CR (MICRO-K) 8 MEQ CPCR capsule CR, Take 8 mEq by mouth as needed (leg cramps).   pantoprazole  (PROTONIX ) 20 MG tablet, Take 2 tablets (40 mg total) by mouth daily.   Medical History:      Past Medical History:  Diagnosis Date   Anxiety     Arthritis     Chronic cough      Sinus CT ned 03-19-10; allergy eval neg 03-19-2010>>>IgE 38 pos only for grass; Singulair tiral 03-19-2010   Depression     Diabetes mellitus without complication (HCC)     DVT (deep venous thrombosis) (HCC)     GERD (gastroesophageal reflux disease)     Hilar adenopathy      CT 02-24-2010   Hyperlipidemia     Hypertension     Other and unspecified hyperlipidemia     Other pulmonary embolism and infarction     Pneumonia, organism unspecified(486)     Pulmonary embolism (HCC)     Unspecified asthma(493.90)     Vitamin B12 deficiency          Allergies:  Allergies       Allergies  Allergen Reactions   Gabapentin     Prednisone        REACTION: mood swings        Surgical History:  She  has a past surgical history that includes left shoulder (01/04/1993); Knee arthroscopy; left knee replacement (01/05/2008); Nasal sinus surgery (age 66); Tubal  ligation (age 14); Tonsillectomy (1987); Cataract extraction (Right); and Trigger finger release (Right, 01/20/2023). Family History:  Her family history includes Breast cancer in her cousin; Cancer in her brother; Colon cancer in her cousin; Heart disease in her mother; Hyperlipidemia in her mother; Hypertension in her mother; Rheum arthritis in her mother.   REVIEW OF SYSTEMS  : All other systems reviewed and negative except where  noted in the History of Present Illness.   PHYSICAL EXAM: BP (!) 162/68   Pulse 70   Ht 5' 5 (1.651 m)   Wt 179 lb 12.8 oz (81.6 kg)   BMI 29.92 kg/m  General Appearance: Elderly, in no apparent distress. Respiratory: Respiratory effort normal, BS equal bilaterally without rales, rhonchi, wheezing. Cardio: RRR with no MRGs. Bilateral lower non pitting edema. Abdomen: Soft,  Obese ,active bowel sounds. No tenderness . Without guarding and Without rebound. No masses. Rectal: Not evaluated Musculoskeletal: Full ROM, walks with walker, unable to get on table in exam room even with assistance Neuro: Alert and  oriented x4;  No focal deficits. Psych:  Cooperative. Normal mood and affect.      Alan JONELLE Coombs, PA-C     Attending physician's note   I have taken history, reviewed the chart and examined the patient. I performed a substantive portion of this encounter, including complete performance of at least one of the key components, in conjunction with the APP. I agree with the Advanced Practitioner's note, impression and recommendations.    For EGD/colon today Check CBC, CMP today   Anselm Bring, MD [facility] [phone]  "

## 2024-01-26 NOTE — Discharge Instructions (Signed)

## 2024-01-26 NOTE — Anesthesia Postprocedure Evaluation (Signed)
"   Anesthesia Post Note  Patient: [name]  Procedure(s) Performed: COLONOSCOPY EGD (ESOPHAGOGASTRODUODENOSCOPY) COLONOSCOPY, WITH ARGON PLASMA COAGULATION POLYPECTOMY, INTESTINE     Patient location during evaluation: PACU Anesthesia Type: MAC Level of consciousness: awake and alert Pain management: pain level controlled Vital Signs Assessment: post-procedure vital signs reviewed and stable Respiratory status: spontaneous breathing Cardiovascular status: stable Anesthetic complications: no Comments: Dr. Charlanne arranging pt to f/u ASAP with PCP for BP, thyroid  function, and sodium recheck   No notable events documented.  Last Vitals:  Vitals:   01/26/24 1010 01/26/24 1020  BP: (!) 198/63 (!) 194/61  Pulse: 88 88  Resp: (!) 21 20  Temp:    SpO2: 97% 98%    Last Pain:  Vitals:   01/26/24 1020  TempSrc:   PainSc: 0-No pain                 [name]      "

## 2024-01-26 NOTE — Transfer of Care (Signed)
 Immediate Anesthesia Transfer of Care Note  Patient: [name]  Procedure(s) Performed: COLONOSCOPY EGD (ESOPHAGOGASTRODUODENOSCOPY) COLONOSCOPY, WITH ARGON PLASMA COAGULATION POLYPECTOMY, INTESTINE  Patient Location: Endoscopy Unit  Anesthesia Type:MAC  Level of Consciousness: awake and alert   Airway & Oxygen  Therapy: Patient Spontanous Breathing and Patient connected to face mask oxygen   Post-op Assessment: Report given to RN and Post -op Vital signs reviewed and stable  Post vital signs: Reviewed and stable  Last Vitals:  Vitals Value Taken Time  BP    Temp    Pulse 86 01/26/24 09:34  Resp 13 01/26/24 09:34  SpO2 100 % 01/26/24 09:34  Vitals shown include unfiled device data.  Last Pain:  Vitals:   01/26/24 0725  TempSrc: Temporal  PainSc: 0-No pain         Complications: No notable events documented.

## 2024-01-26 NOTE — Op Note (Signed)
 [facility] Patient Name: [name] Procedure Date: 01/26/2024 MRN: [id number] Attending MD: Lynnie Bring , MD, [id number] Date of Birth: [birth year] CSN: [id number] Age: 79 Admit Type: Outpatient Procedure:                Upper GI endoscopy Indications:              FU gastric ulcers. Patient admitted with UGI                            bleeding 03/07/2023 Providers:                Lynnie Bring, MD, Gregoria Pierce, RN, Felice Sar, Technician Referring MD:             Dr Gable Medicines:                Monitored Anesthesia Care Complications:            No immediate complications. Estimated Blood Loss:     Estimated blood loss: none. Procedure:                Pre-Anesthesia Assessment:                           - Prior to the procedure, a History and Physical                            was performed, and patient medications and                            allergies were reviewed. The patient's tolerance of                            previous anesthesia was also reviewed. The risks                            and benefits of the procedure and the sedation                            options and risks were discussed with the patient.                            All questions were answered, and informed consent                            was obtained. Prior Anticoagulants: The patient has                            taken no anticoagulant or antiplatelet agents. ASA                            Grade Assessment: III - A patient with severe  systemic disease. After reviewing the risks and                            benefits, the patient was deemed in satisfactory                            condition to undergo the procedure.                           After obtaining informed consent, the endoscope was                            passed under direct vision. Throughout the                            procedure, the patient's  blood pressure, pulse, and                            oxygen  saturations were monitored continuously. The                            [id number]-[id number] ([id number]) Olympus endoscope was introduced                            through the mouth, and advanced to the second part                            of duodenum. The upper GI endoscopy was                            accomplished without difficulty. The patient                            tolerated the procedure well. Scope In: Scope Out: Findings:      The examined esophagus was normal with well-defined Z-line at 37 cm.      The entire examined stomach was J-shaped but normal. The ulcers had       completely healed.      The examined duodenum was normal. Impression:               - Normal EGD. Complete healing of gastric ulcers                           - No specimens collected. Moderate Sedation:      Not Applicable - Patient had care per Anesthesia. Recommendation:           - Patient has a contact number available for                            emergencies. The signs and symptoms of potential                            delayed complications were discussed with the  patient. Return to normal activities tomorrow.                            Written discharge instructions were provided to the                            patient.                           - Resume previous diet.                           - Continue present medications including Protonix                             40 mg p.o. daily.                           - The findings and recommendations were discussed                            with the patient's family. Procedure Code(s):        --- Professional ---                           [phone], Esophagogastroduodenoscopy, flexible,                            transoral; diagnostic, including collection of                            specimen(s) by brushing or washing, when performed                             (separate procedure) Diagnosis Code(s):        --- Professional ---                           R10.13, Epigastric pain CPT copyright 2022 [employer]. All rights reserved. The codes documented in this report are preliminary and upon coder review may  be revised to meet current compliance requirements. Lynnie Bring, MD 01/26/2024 9:02:37 AM This report has been signed electronically. Number of Addenda: 0

## 2024-01-27 LAB — SURGICAL PATHOLOGY

## 2024-01-28 ENCOUNTER — Encounter (HOSPITAL_COMMUNITY): Payer: Self-pay | Admitting: Gastroenterology

## 2024-02-03 ENCOUNTER — Ambulatory Visit: Admitting: Endocrinology

## 2024-03-15 ENCOUNTER — Ambulatory Visit: Admitting: Endocrinology

## 2024-06-21 ENCOUNTER — Inpatient Hospital Stay

## 2024-06-22 ENCOUNTER — Inpatient Hospital Stay: Admitting: Oncology
